# Patient Record
Sex: Male | Born: 1995
Health system: Southern US, Community
[De-identification: ages and names within clinical notes are randomized; demographics above are authoritative.]

## PROBLEM LIST (undated history)

## (undated) DIAGNOSIS — S272XXA Traumatic hemopneumothorax, initial encounter: Secondary | ICD-10-CM

## (undated) DIAGNOSIS — I82409 Acute embolism and thrombosis of unspecified deep veins of unspecified lower extremity: Secondary | ICD-10-CM

## (undated) DIAGNOSIS — Z789 Other specified health status: Secondary | ICD-10-CM

## (undated) DIAGNOSIS — J939 Pneumothorax, unspecified: Secondary | ICD-10-CM

## (undated) DIAGNOSIS — W3400XA Accidental discharge from unspecified firearms or gun, initial encounter: Secondary | ICD-10-CM

## (undated) DIAGNOSIS — S37001A Unspecified injury of right kidney, initial encounter: Secondary | ICD-10-CM

## (undated) DIAGNOSIS — N179 Acute kidney failure, unspecified: Secondary | ICD-10-CM

## (undated) DIAGNOSIS — S2241XA Multiple fractures of ribs, right side, initial encounter for closed fracture: Secondary | ICD-10-CM

## (undated) DIAGNOSIS — D62 Acute posthemorrhagic anemia: Secondary | ICD-10-CM

## (undated) DIAGNOSIS — Z9289 Personal history of other medical treatment: Secondary | ICD-10-CM

## (undated) DIAGNOSIS — T8149XA Infection following a procedure, other surgical site, initial encounter: Secondary | ICD-10-CM

## (undated) DIAGNOSIS — G40901 Epilepsy, unspecified, not intractable, with status epilepticus: Secondary | ICD-10-CM

## (undated) HISTORY — DX: Traumatic hemopneumothorax, initial encounter: S27.2XXA

## (undated) HISTORY — DX: Unspecified injury of right kidney, initial encounter: S37.001A

## (undated) HISTORY — DX: Multiple fractures of ribs, right side, initial encounter for closed fracture: S22.41XA

## (undated) HISTORY — DX: Epilepsy, unspecified, not intractable, with status epilepticus: G40.901

## (undated) HISTORY — DX: Infection following a procedure, other surgical site, initial encounter: T81.49XA

## (undated) HISTORY — DX: Acute embolism and thrombosis of unspecified deep veins of unspecified lower extremity: I82.409

## (undated) HISTORY — DX: Acute posthemorrhagic anemia: D62

## (undated) HISTORY — DX: Acute kidney failure, unspecified: N17.9

---
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
## 2014-05-23 ENCOUNTER — Encounter (HOSPITAL_COMMUNITY): Payer: Self-pay | Admitting: Adult Health

## 2014-05-23 ENCOUNTER — Emergency Department (HOSPITAL_COMMUNITY)
Admission: EM | Admit: 2014-05-23 | Discharge: 2014-05-23 | Disposition: A | Discharge status: Home / Self Care | Payer: No Typology Code available for payment source | Attending: Emergency Medicine | Admitting: Emergency Medicine

## 2014-05-23 DIAGNOSIS — L259 Unspecified contact dermatitis, unspecified cause: Secondary | ICD-10-CM | POA: Insufficient documentation

## 2014-05-23 DIAGNOSIS — Z720 Tobacco use: Secondary | ICD-10-CM | POA: Insufficient documentation

## 2014-05-23 DIAGNOSIS — M545 Low back pain, unspecified: Secondary | ICD-10-CM

## 2014-05-23 DIAGNOSIS — B86 Scabies: Secondary | ICD-10-CM

## 2014-05-23 MED ORDER — TRAMADOL HCL 50 MG PO TABS: 50.0000 mg | ORAL_TABLET | Freq: Four times a day (QID) | ORAL | Status: DC | PRN

## 2014-05-23 MED ORDER — TRAMADOL HCL 50 MG PO TABS
50.0000 mg | ORAL_TABLET | Freq: Once | ORAL | Status: AC
  Administered 2014-05-23: 50 mg via ORAL
  Filled 2014-05-23: qty 1

## 2014-05-23 MED ORDER — PERMETHRIN 5 % EX CREA: TOPICAL_CREAM | CUTANEOUS | Status: DC

## 2014-05-23 MED ORDER — DIPHENHYDRAMINE HCL 25 MG PO TABS: 25.0000 mg | ORAL_TABLET | Freq: Four times a day (QID) | ORAL | Status: DC | PRN

## 2014-05-23 NOTE — Discharge Instructions (Signed)
Scabies Scabies are small bugs (mites) that burrow under the skin and cause red bumps and severe itching. These bugs can only be seen with a microscope. Scabies are highly contagious. They can spread easily from person to person by direct contact. They are also spread through sharing clothing or linens that have the scabies mites living in them. It is not unusual for an entire family to become infected through shared towels, clothing, or bedding.  HOME CARE INSTRUCTIONS   Your caregiver may prescribe a cream or lotion to kill the mites. If cream is prescribed, massage the cream into the entire body from the neck to the bottom of both feet. Also massage the cream into the scalp and face if your child is less than 609 year old. Avoid the eyes and mouth. Do not wash your hands after application.  Leave the cream on for 8 to 12 hours. Your child should bathe or shower after the 8 to 12 hour application period. Sometimes it is helpful to apply the cream to your child right before bedtime.  One treatment is usually effective and will eliminate approximately 95% of infestations. For severe cases, your caregiver may decide to repeat the treatment in 1 week. Everyone in your household should be treated with one application of the cream.  New rashes or burrows should not appear within 24 to 48 hours after successful treatment. However, the itching and rash may last for 2 to 4 weeks after successful treatment. Your caregiver may prescribe a medicine to help with the itching or to help the rash go away more quickly.  Scabies can live on clothing or linens for up to 3 days. All of your child's recently used clothing, towels, stuffed toys, and bed linens should be washed in hot water and then dried in a dryer for at least 20 minutes on high heat. Items that cannot be washed should be enclosed in a plastic bag for at least 3 days.  To help relieve itching, bathe your child in a cool bath or apply cool washcloths to the  affected areas.  Your child may return to school after treatment with the prescribed cream. SEEK MEDICAL CARE IF:   The itching persists longer than 4 weeks after treatment.  The rash spreads or becomes infected. Signs of infection include red blisters or yellow-tan crust. Document Released: 07/07/2005 Document Revised: 09/29/2011 Document Reviewed: 11/15/2008 [facility] CenterExitCare Patient Information 2015 [place]ExitCare, [place]. This information is not intended to replace advice given to you by your health care provider. Make sure you discuss any questions you have with your health care provider. Bedbugs Bedbugs are tiny bugs that live in and around beds. During the day, they hide in mattresses and other places near beds. They come out at night and bite people lying in bed. They need blood to live and grow. Bedbugs can be found in beds anywhere. Usually, they are found in places where many people come and go (hotels, shelters, hospitals). It does not matter whether the place is dirty or clean. Getting bitten by bedbugs rarely causes a medical problem. The biggest problem can be getting rid of them. This often takes the work of a Oncologistpest control expert. CAUSES  Less use of pesticides. Bedbugs were common before the 1950s. Then, strong pesticides such as DDT nearly wiped them out. Today, these pesticides are not used because they harm the environment and can cause health problems.  More travel. Besides mattresses, bedbugs can also live in clothing and luggage. They can  come along as people travel from place to place. Bedbugs are more common in certain parts of the world. When people travel to those areas, the bugs can come home with them.  Presence of birds and bats. Bedbugs often infest birds and bats. If you have these animals in or near your home, bedbugs may infest your house, too. SYMPTOMS It does not hurt to be bitten by a bedbug. You will probably not wake up when you are bitten. Bedbugs usually bite areas of  the skin that are not covered. Symptoms may show when you wake up, or they may take a day or more to show up. Symptoms may include:  Small red bumps on the skin. These might be lined up in a row or clustered in a group.  A darker red dot in the middle of red bumps.  Blisters on the skin. There may be swelling and very bad itching. These may be signs of an allergic reaction. This does not happen often. DIAGNOSIS Bedbug bites might look and feel like other types of insect bites. The bugs do not stay on the body like ticks or lice. They bite, drop off, and crawl away to hide. Your caregiver will probably:  Ask about your symptoms.  Ask about your recent activities and travel.  Check your skin for bedbug bites.  Ask you to check at home for signs of bedbugs. You should look for:  Spots or stains on the bed or nearby. This could be from bedbugs that were crushed or from their eggs or waste.  Bedbugs themselves. They are reddish-brown, oval, and flat. They do not fly. They are about the size of an apple seed.  Places to look for bedbugs include:  Beds. Check mattresses, headboards, box springs, and bed frames.  On drapes and curtains near the bed.  Under carpeting in the bedroom.  Behind electrical outlets.  Behind any wallpaper that is peeling.  Inside luggage. TREATMENT Most bedbug bites do not need treatment. They usually go away on their own in a few days. The bites are not dangerous. However, treatment may be needed if you have scratched so much that your skin has become infected. You may also need treatment if you are allergic to bedbug bites. Treatment options include:  A drug that stops swelling and itching (corticosteroid). Usually, a cream is rubbed on the skin. If you have a bad rash, you may be given a corticosteroid pill.  Oral antihistamines. These are pills to help control itching.  Antibiotic medicines. An antibiotic may be prescribed for infected skin. HOME CARE  INSTRUCTIONS   Take any medicine prescribed by your caregiver for your bites. Follow the directions carefully.  Consider wearing pajamas with long sleeves and pant legs.  Your bedroom may need to be treated. A pest control expert should make sure the bedbugs are gone. You may need to throw away mattresses or luggage. Ask the pest control expert what you can do to keep the bedbugs from coming back. Common suggestions include:  Putting a plastic cover over your mattress.  Washing and drying your clothes and bedding in hot water and a hot dryer. The temperature should be hotter than 120 F (48.9 C). Bedbugs are killed by high temperatures.  Vacuuming carefully all around your bed. Vacuum in all cracks and crevices where the bugs might hide. Do this often.  Carefully checking all used furniture, bedding, or clothes that you bring into your house.  Eliminating bird nests and bat roosts.  If you get bedbug bites when traveling, check all your possessions carefully before bringing them into your house. If you find any bugs on clothes or in your luggage, consider throwing those items away. SEEK MEDICAL CARE IF:  You have red bug bites that keep coming back.  You have red bug bites that itch badly.  You have bug bites that cause a skin rash.  You have scratch marks that are red and sore. SEEK IMMEDIATE MEDICAL CARE IF: You have a fever. Document Released: 08/09/2010 Document Revised: 09/29/2011 Document Reviewed: 08/09/2010 [facility]Exit[facility] Patient Information 2015 [place]ExitCare, [place]LLC. This information is not intended to replace advice given to you by your health care provider. Make sure you discuss any questions you have with your health care provider. Muscle Strain A muscle strain is an injury that occurs when a muscle is stretched beyond its normal length. Usually a small number of muscle fibers are torn when this happens. Muscle strain is rated in degrees. First-degree strains have the least amount  of muscle fiber tearing and pain. Second-degree and third-degree strains have increasingly more tearing and pain.  Usually, recovery from muscle strain takes 1-2 weeks. Complete healing takes 5-6 weeks.  CAUSES  Muscle strain happens when a sudden, violent force placed on a muscle stretches it too far. This may occur with lifting, sports, or a fall.  RISK FACTORS Muscle strain is especially common in athletes.  SIGNS AND SYMPTOMS At the site of the muscle strain, there may be:  Pain.  Bruising.  Swelling.  Difficulty using the muscle due to pain or lack of normal function. DIAGNOSIS  Your health care provider will perform a physical exam and ask about your medical history. TREATMENT  Often, the best treatment for a muscle strain is resting, icing, and applying cold compresses to the injured area.  HOME CARE INSTRUCTIONS   Use the PRICE method of treatment to promote muscle healing during the first 2-3 days after your injury. The PRICE method involves:  Protecting the muscle from being injured again.  Restricting your activity and resting the injured body part.  Icing your injury. To do this, put ice in a plastic bag. Place a towel between your skin and the bag. Then, apply the ice and leave it on from 15-20 minutes each hour. After the third day, switch to moist heat packs.  Apply compression to the injured area with a splint or elastic bandage. Be careful not to wrap it too tightly. This may interfere with blood circulation or increase swelling.  Elevate the injured body part above the level of your heart as often as you can.  Only take over-the-counter or prescription medicines for pain, discomfort, or fever as directed by your health care provider.  Warming up prior to exercise helps to prevent future muscle strains. SEEK MEDICAL CARE IF:   You have increasing pain or swelling in the injured area.  You have numbness, tingling, or a significant loss of strength in the  injured area. MAKE SURE YOU:   Understand these instructions.  Will watch your condition.  Will get help right away if you are not doing well or get worse. Document Released: 07/07/2005 Document Revised: 04/27/2013 Document Reviewed: 02/03/2013 [facility] OhioExitCare Patient Information 2015 [place]ExitCare, [place]LLC. This information is not intended to replace advice given to you by your health care provider. Make sure you discuss any questions you have with your health care provider.
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
## 2014-05-23 NOTE — ED Notes (Signed)
Presents with one-two weeks of itchy rash to extremities and trunk. C/o itching, GF has same rash.

## 2015-06-28 ENCOUNTER — Emergency Department (HOSPITAL_COMMUNITY)
Admission: EM | Admit: 2015-06-28 | Discharge: 2015-06-28 | Disposition: A | Discharge status: Home / Self Care | Payer: Medicaid Other | Attending: Emergency Medicine | Admitting: Emergency Medicine

## 2015-06-28 ENCOUNTER — Encounter (HOSPITAL_COMMUNITY): Payer: Self-pay | Admitting: Emergency Medicine

## 2015-06-28 DIAGNOSIS — L0291 Cutaneous abscess, unspecified: Secondary | ICD-10-CM

## 2015-06-28 DIAGNOSIS — M272 Inflammatory conditions of jaws: Secondary | ICD-10-CM | POA: Insufficient documentation

## 2015-06-28 DIAGNOSIS — F17210 Nicotine dependence, cigarettes, uncomplicated: Secondary | ICD-10-CM | POA: Insufficient documentation

## 2015-06-28 MED ORDER — LIDOCAINE HCL (PF) 1 % IJ SOLN
5.0000 mL | Freq: Once | INTRAMUSCULAR | Status: DC
  Filled 2015-06-28: qty 5

## 2015-06-28 NOTE — ED Provider Notes (Signed)
CSN: [id number]     Arrival date & time 06/28/15  1129 History  By signing my name below, I, Samuel Owens, attest that this documentation has been prepared under the direction and in the presence of  Samuel PeekBenjamin Kaelani Kendrick, PA-C. Electronically Signed: Doreatha MartinEva Owens, ED Scribe. 06/28/2015. 2:05 PM.    Chief Complaint  Patient presents with  . Abscess   The history is provided by the patient. No language interpreter was used.    HPI Comments: [name] is a 19 y.o. male who presents to the Emergency Department complaining of a moderate, gradually worsening area of pain and swelling to the right jaw onset 2 days ago. Pt states pain is worsened with palpation. Pt states h/o similar abscess on the left ear, which was lanced. Pt denies taking OTC medications at home to improve symptoms. He states he has tried a warm compress with no relief. No h/o DM, HIV. He denies ear pain, decreased hearing, HA, fever, neck pain.    History reviewed. No pertinent past medical history. History reviewed. No pertinent past surgical history. History reviewed. No pertinent family history. Social History  Substance Use Topics  . Smoking status: Current Every Day Smoker -- 0.50 packs/day    Types: Cigarettes  . Smokeless tobacco: Never Used  . Alcohol Use: Yes     Comment: "sometimes"    Review of Systems A 10 point review of systems was completed and was negative except for pertinent positives and negatives as mentioned in the history of present illness.   Allergies  Review of patient's allergies indicates no known allergies.  Home Medications   Prior to Admission medications   Medication Sig Start Date End Date Taking? Authorizing Provider  acetaminophen (TYLENOL) 325 MG tablet Take 650 mg by mouth every 6 (six) hours as needed for pain.    Historical Provider, MD  diphenhydrAMINE (BENADRYL) 25 MG tablet Take 1 tablet (25 mg total) by mouth every 6 (six) hours as needed for itching (Rash). 05/23/14   Antony MaduraKelly  Humes, PA-C  ibuprofen (ADVIL,MOTRIN) 200 MG tablet Take 400 mg by mouth every 6 (six) hours as needed for pain.    Historical Provider, MD  permethrin (ELIMITE) 5 % cream Apply to entire body other than face - let sit for 14 hours then wash off, may repeat in 1 week if still having symptoms 05/23/14   Antony MaduraKelly Humes, PA-C  traMADol (ULTRAM) 50 MG tablet Take 1 tablet (50 mg total) by mouth every 6 (six) hours as needed for moderate pain or severe pain. 05/23/14   Antony MaduraKelly Humes, PA-C   BP 137/90 mmHg  Pulse 56  Temp(Src) 97.8 F (36.6 C) (Oral)  Resp 15  SpO2 100% Physical Exam  Constitutional: He is oriented to person, place, and time. He appears well-developed and well-nourished.  Awake, alert, nontoxic appearance.    HENT:  Head: Normocephalic and atraumatic.  Mouth/Throat: Oropharynx is clear and moist. No oropharyngeal exudate.  Fluctuant erythematous area noted to the right mandibular jaw line just anterior to the pinna. Area is approximately 2.5cm in diameter.   Eyes: Conjunctivae and EOM are normal. Pupils are equal, round, and reactive to light.  Neck: Normal range of motion. Neck supple.  No cervical lymphadenopathy.   Cardiovascular: Normal rate, regular rhythm and normal heart sounds.  Exam reveals no gallop and no friction rub.   No murmur heard. Heart sounds normal. RRR.   Pulmonary/Chest: Effort normal and breath sounds normal. No respiratory distress. He has no wheezes. He  has no rales.  Lungs CTA bilaterally.   Abdominal: Soft. Bowel sounds are normal. He exhibits no distension. There is no tenderness.  Musculoskeletal: Normal range of motion.  Lymphadenopathy:    He has no cervical adenopathy.  Neurological: He is alert and oriented to person, place, and time.  Skin: Skin is warm and dry.  Psychiatric: He has a normal mood and affect. His behavior is normal.  Nursing note and vitals reviewed.  ED Course  Procedures (including critical care time) DIAGNOSTIC  STUDIES: Oxygen Saturation is 97% on RA, normal by my interpretation.    COORDINATION OF CARE: 1:59 PM Discussed treatment plan with pt at bedside and pt agreed to plan. Plan to [place] abscess and incision/drainage.    EMERGENCY DEPARTMENT US SOFT TISSUE INTERPRETATION "Study: Limited Soft Tissue Ultrasound"  INDICATIONS: Pain Multiple views of the body part were obtained in real-time with a multi-frequency linear probe PERFORMED BY:  Myself IMAGES ARCHIVED?: No SIDE:Right  BODY PART:Other soft tisse (comment in note) Right mandibular jaw line just anterior to the pinna.  FINDINGS: Abcess present and Cellulitis absent INTERPRETATION:  Abcess present and No cellulitis noted  INCISION AND DRAINAGE PROCEDURE NOTE: Patient identification was confirmed and verbal consent was obtained. This procedure was performed by Samuel Peek, PA-C at 2:37 PM. Site: Right mandibular jaw line just anterior to the pinna.  Sterile procedures observed Needle size: 25 Anesthetic used (type and amt): 3 mL 1% lidocaine without epi Blade size: 11 Drainage: Moderate amount of purulent drainage Complexity: Complex Site anesthetized, incision made over site, wound drained and explored loculations, rinsed with copious amounts of normal saline, topical bacitracin applied, covered with dry, sterile dressing.  Pt tolerated procedure well without complications.  Instructions for care discussed verbally and pt provided with additional written instructions for homecare and f/u.   Meds given in ED:  Medications  lidocaine (PF) (XYLOCAINE) 1 % injection 5 mL (not administered)    Discharge Medication List as of 06/28/2015  2:53 PM       Filed Vitals:   06/28/15 1210 06/28/15 1456  BP: 122/76 137/90  Pulse: 66 56  Temp: 97.8 F (36.6 C) 97.8 F (36.6 C)  Resp: 16 15    MDM   Final diagnoses:  Abscess    Patient with skin abscess. Bedside US showed abscess present with no cellulitis. Incision and  drainage performed in the ED today. Abscess was not large enough to warrant packing or drain placement. Topical bacitracin with dressing applied. Wound recheck in 2 days. Supportive care and return precautions discussed. The patient appears reasonably screened and/or stabilized for discharge and I doubt any other emergent medical condition requiring further screening, evaluation, or treatment in the ED prior to discharge.   I personally performed the services described in this documentation, which was scribed in my presence. The recorded information has been reviewed and is accurate.     Samuel Peek, PA-C 06/28/15 1544  Alvira Monday, MD 06/29/15 [phone]

## 2015-06-28 NOTE — ED Notes (Signed)
Pt from home with c/o abscess on right jaw line starting x 2 days.  Pt ambulatory NAD A&O

## 2015-06-28 NOTE — Discharge Instructions (Signed)
Your wound will drain over the next few days. Try to keep it clean and dry. Your wound should heal on its own. He may use a topical antibiotic. Follow-up with your doctor as needed. Return to ED for any new or worsening symptoms.  Incision and Drainage Incision and drainage is a procedure in which a sac-like structure (cystic structure) is opened and drained. The area to be drained usually contains material such as pus, fluid, or blood.  LET YOUR CAREGIVER KNOW ABOUT:   Allergies to medicine.  Medicines taken, including vitamins, herbs, eyedrops, over-the-counter medicines, and creams.  Use of steroids (by mouth or creams).  Previous problems with anesthetics or numbing medicines.  History of bleeding problems or blood clots.  Previous surgery.  Other health problems, including diabetes and kidney problems.  Possibility of pregnancy, if this applies. RISKS AND COMPLICATIONS  Pain.  Bleeding.  Scarring.  Infection. BEFORE THE PROCEDURE  You may need to have an ultrasound or other imaging tests to see how large or deep your cystic structure is. Blood tests may also be used to determine if you have an infection or how severe the infection is. You may need to have a tetanus shot. PROCEDURE  The affected area is cleaned with a cleaning fluid. The cyst area will then be numbed with a medicine (local anesthetic). A small incision will be made in the cystic structure. A syringe or catheter may be used to drain the contents of the cystic structure, or the contents may be squeezed out. The area will then be flushed with a cleansing solution. After cleansing the area, it is often gently packed with a gauze or another wound dressing. Once it is packed, it will be covered with gauze and tape or some other type of wound dressing. AFTER THE PROCEDURE   Often, you will be allowed to go home right after the procedure.  You may be given antibiotic medicine to prevent or heal an infection.  If  the area was packed with gauze or some other wound dressing, you will likely need to come back in 1 to 2 days to get it removed.  The area should heal in about 14 days.   This information is not intended to replace advice given to you by your health care provider. Make sure you discuss any questions you have with your health care provider.   Document Released: 12/31/2000 Document Revised: 01/06/2012 Document Reviewed: 09/01/2011 Elsevier Interactive Patient Education [employer]

## 2015-07-22 DIAGNOSIS — W3400XA Accidental discharge from unspecified firearms or gun, initial encounter: Secondary | ICD-10-CM

## 2015-07-22 HISTORY — DX: Accidental discharge from unspecified firearms or gun, initial encounter: W34.00XA

## 2016-01-03 ENCOUNTER — Inpatient Hospital Stay (HOSPITAL_COMMUNITY)
Admission: EM | Admit: 2016-01-03 | Discharge: 2016-02-12 | DRG: 957 | Disposition: A | Discharge status: Home Health | Payer: Medicaid Other | Attending: General Surgery | Admitting: General Surgery

## 2016-01-03 ENCOUNTER — Emergency Department (HOSPITAL_COMMUNITY): Payer: Medicaid Other

## 2016-01-03 ENCOUNTER — Encounter (HOSPITAL_COMMUNITY): Payer: Self-pay | Admitting: *Deleted

## 2016-01-03 ENCOUNTER — Encounter (HOSPITAL_COMMUNITY): Admission: EM | Disposition: A | Discharge status: Home Health | Payer: Self-pay | Source: Home / Self Care

## 2016-01-03 ENCOUNTER — Ambulatory Visit (HOSPITAL_COMMUNITY)
Admission: RE | Admit: 2016-01-03 | Discharge: 2016-01-03 | Disposition: A | Discharge status: Other Acute Inpatient Hospital | Payer: Medicaid Other | Source: Ambulatory Visit | Attending: Urology | Admitting: Urology

## 2016-01-03 DIAGNOSIS — Y249XXA Unspecified firearm discharge, undetermined intent, initial encounter: Secondary | ICD-10-CM

## 2016-01-03 DIAGNOSIS — N151 Renal and perinephric abscess: Secondary | ICD-10-CM | POA: Diagnosis not present

## 2016-01-03 DIAGNOSIS — Z978 Presence of other specified devices: Secondary | ICD-10-CM | POA: Diagnosis present

## 2016-01-03 DIAGNOSIS — R739 Hyperglycemia, unspecified: Secondary | ICD-10-CM | POA: Diagnosis not present

## 2016-01-03 DIAGNOSIS — S37001A Unspecified injury of right kidney, initial encounter: Secondary | ICD-10-CM | POA: Diagnosis present

## 2016-01-03 DIAGNOSIS — J80 Acute respiratory distress syndrome: Secondary | ICD-10-CM | POA: Diagnosis not present

## 2016-01-03 DIAGNOSIS — J9601 Acute respiratory failure with hypoxia: Secondary | ICD-10-CM

## 2016-01-03 DIAGNOSIS — I998 Other disorder of circulatory system: Secondary | ICD-10-CM | POA: Diagnosis not present

## 2016-01-03 DIAGNOSIS — J9690 Respiratory failure, unspecified, unspecified whether with hypoxia or hypercapnia: Secondary | ICD-10-CM

## 2016-01-03 DIAGNOSIS — W3400XA Accidental discharge from unspecified firearms or gun, initial encounter: Secondary | ICD-10-CM

## 2016-01-03 DIAGNOSIS — N390 Urinary tract infection, site not specified: Secondary | ICD-10-CM | POA: Diagnosis not present

## 2016-01-03 DIAGNOSIS — N179 Acute kidney failure, unspecified: Secondary | ICD-10-CM | POA: Diagnosis not present

## 2016-01-03 DIAGNOSIS — IMO0001 Reserved for inherently not codable concepts without codable children: Secondary | ICD-10-CM

## 2016-01-03 DIAGNOSIS — R509 Fever, unspecified: Secondary | ICD-10-CM | POA: Diagnosis not present

## 2016-01-03 DIAGNOSIS — J9600 Acute respiratory failure, unspecified whether with hypoxia or hypercapnia: Secondary | ICD-10-CM | POA: Diagnosis present

## 2016-01-03 DIAGNOSIS — Z09 Encounter for follow-up examination after completed treatment for conditions other than malignant neoplasm: Secondary | ICD-10-CM

## 2016-01-03 DIAGNOSIS — S21139A Puncture wound without foreign body of unspecified front wall of thorax without penetration into thoracic cavity, initial encounter: Secondary | ICD-10-CM | POA: Diagnosis present

## 2016-01-03 DIAGNOSIS — S36113A Laceration of liver, unspecified degree, initial encounter: Secondary | ICD-10-CM | POA: Diagnosis present

## 2016-01-03 DIAGNOSIS — T8149XA Infection following a procedure, other surgical site, initial encounter: Secondary | ICD-10-CM

## 2016-01-03 DIAGNOSIS — G40901 Epilepsy, unspecified, not intractable, with status epilepticus: Secondary | ICD-10-CM | POA: Insufficient documentation

## 2016-01-03 DIAGNOSIS — A419 Sepsis, unspecified organism: Secondary | ICD-10-CM

## 2016-01-03 DIAGNOSIS — R1110 Vomiting, unspecified: Secondary | ICD-10-CM

## 2016-01-03 DIAGNOSIS — Z4659 Encounter for fitting and adjustment of other gastrointestinal appliance and device: Secondary | ICD-10-CM

## 2016-01-03 DIAGNOSIS — K661 Hemoperitoneum: Secondary | ICD-10-CM | POA: Diagnosis present

## 2016-01-03 DIAGNOSIS — D696 Thrombocytopenia, unspecified: Secondary | ICD-10-CM | POA: Diagnosis present

## 2016-01-03 DIAGNOSIS — N170 Acute kidney failure with tubular necrosis: Secondary | ICD-10-CM | POA: Diagnosis not present

## 2016-01-03 DIAGNOSIS — I82C19 Acute embolism and thrombosis of unspecified internal jugular vein: Secondary | ICD-10-CM | POA: Diagnosis not present

## 2016-01-03 DIAGNOSIS — R0902 Hypoxemia: Secondary | ICD-10-CM

## 2016-01-03 DIAGNOSIS — D72829 Elevated white blood cell count, unspecified: Secondary | ICD-10-CM

## 2016-01-03 DIAGNOSIS — S299XXA Unspecified injury of thorax, initial encounter: Secondary | ICD-10-CM

## 2016-01-03 DIAGNOSIS — Z9689 Presence of other specified functional implants: Secondary | ICD-10-CM

## 2016-01-03 DIAGNOSIS — T8143XA Infection following a procedure, organ and space surgical site, initial encounter: Secondary | ICD-10-CM | POA: Diagnosis not present

## 2016-01-03 DIAGNOSIS — I158 Other secondary hypertension: Secondary | ICD-10-CM

## 2016-01-03 DIAGNOSIS — IMO0002 Reserved for concepts with insufficient information to code with codable children: Secondary | ICD-10-CM | POA: Insufficient documentation

## 2016-01-03 DIAGNOSIS — R5383 Other fatigue: Secondary | ICD-10-CM | POA: Diagnosis present

## 2016-01-03 DIAGNOSIS — J942 Hemothorax: Secondary | ICD-10-CM

## 2016-01-03 DIAGNOSIS — D62 Acute posthemorrhagic anemia: Secondary | ICD-10-CM | POA: Diagnosis not present

## 2016-01-03 DIAGNOSIS — S2241XA Multiple fractures of ribs, right side, initial encounter for closed fracture: Secondary | ICD-10-CM | POA: Diagnosis present

## 2016-01-03 DIAGNOSIS — T17908A Unspecified foreign body in respiratory tract, part unspecified causing other injury, initial encounter: Secondary | ICD-10-CM

## 2016-01-03 DIAGNOSIS — R6521 Severe sepsis with septic shock: Secondary | ICD-10-CM | POA: Diagnosis not present

## 2016-01-03 DIAGNOSIS — R188 Other ascites: Secondary | ICD-10-CM

## 2016-01-03 DIAGNOSIS — R000 Tachycardia, unspecified: Secondary | ICD-10-CM

## 2016-01-03 DIAGNOSIS — E861 Hypovolemia: Secondary | ICD-10-CM | POA: Diagnosis not present

## 2016-01-03 DIAGNOSIS — E876 Hypokalemia: Secondary | ICD-10-CM

## 2016-01-03 DIAGNOSIS — R569 Unspecified convulsions: Secondary | ICD-10-CM

## 2016-01-03 DIAGNOSIS — D6489 Other specified anemias: Secondary | ICD-10-CM | POA: Diagnosis not present

## 2016-01-03 DIAGNOSIS — I82629 Acute embolism and thrombosis of deep veins of unspecified upper extremity: Secondary | ICD-10-CM | POA: Diagnosis not present

## 2016-01-03 DIAGNOSIS — R740 Nonspecific elevation of levels of transaminase and lactic acid dehydrogenase [LDH]: Secondary | ICD-10-CM | POA: Diagnosis present

## 2016-01-03 DIAGNOSIS — K651 Peritoneal abscess: Secondary | ICD-10-CM | POA: Diagnosis not present

## 2016-01-03 DIAGNOSIS — S272XXA Traumatic hemopneumothorax, initial encounter: Secondary | ICD-10-CM | POA: Diagnosis present

## 2016-01-03 DIAGNOSIS — R0989 Other specified symptoms and signs involving the circulatory and respiratory systems: Secondary | ICD-10-CM

## 2016-01-03 DIAGNOSIS — R34 Anuria and oliguria: Secondary | ICD-10-CM | POA: Diagnosis not present

## 2016-01-03 DIAGNOSIS — J690 Pneumonitis due to inhalation of food and vomit: Secondary | ICD-10-CM | POA: Diagnosis not present

## 2016-01-03 DIAGNOSIS — E872 Acidosis: Secondary | ICD-10-CM | POA: Diagnosis not present

## 2016-01-03 DIAGNOSIS — E871 Hypo-osmolality and hyponatremia: Secondary | ICD-10-CM

## 2016-01-03 DIAGNOSIS — B9620 Unspecified Escherichia coli [E. coli] as the cause of diseases classified elsewhere: Secondary | ICD-10-CM | POA: Diagnosis not present

## 2016-01-03 DIAGNOSIS — S37031A Laceration of right kidney, unspecified degree, initial encounter: Secondary | ICD-10-CM | POA: Diagnosis present

## 2016-01-03 DIAGNOSIS — G9340 Encephalopathy, unspecified: Secondary | ICD-10-CM | POA: Diagnosis not present

## 2016-01-03 DIAGNOSIS — I82409 Acute embolism and thrombosis of unspecified deep veins of unspecified lower extremity: Secondary | ICD-10-CM | POA: Diagnosis not present

## 2016-01-03 DIAGNOSIS — K750 Abscess of liver: Secondary | ICD-10-CM | POA: Diagnosis not present

## 2016-01-03 DIAGNOSIS — Y95 Nosocomial condition: Secondary | ICD-10-CM | POA: Diagnosis not present

## 2016-01-03 DIAGNOSIS — I10 Essential (primary) hypertension: Secondary | ICD-10-CM | POA: Diagnosis not present

## 2016-01-03 DIAGNOSIS — E8770 Fluid overload, unspecified: Secondary | ICD-10-CM | POA: Diagnosis not present

## 2016-01-03 DIAGNOSIS — T814XXD Infection following a procedure, subsequent encounter: Secondary | ICD-10-CM

## 2016-01-03 DIAGNOSIS — G8918 Other acute postprocedural pain: Secondary | ICD-10-CM

## 2016-01-03 DIAGNOSIS — A047 Enterocolitis due to Clostridium difficile: Secondary | ICD-10-CM | POA: Diagnosis not present

## 2016-01-03 HISTORY — PX: CHOLECYSTECTOMY: SHX55

## 2016-01-03 HISTORY — DX: Other specified health status: Z78.9

## 2016-01-03 HISTORY — PX: HEPATORRHAPHY: SHX6320

## 2016-01-03 HISTORY — DX: Accidental discharge from unspecified firearms or gun, initial encounter: W34.00XA

## 2016-01-03 HISTORY — PX: LAPAROTOMY: SHX154

## 2016-01-03 HISTORY — PX: APPLICATION OF WOUND VAC: SHX5189

## 2016-01-03 LAB — CBC
HCT: 36.5 % — ABNORMAL LOW (ref 39.0–52.0)
Hemoglobin: 11.5 g/dL — ABNORMAL LOW (ref 13.0–17.0)
MCH: 28.9 pg (ref 26.0–34.0)
MCHC: 31.5 g/dL (ref 30.0–36.0)
MCV: 91.7 fL (ref 78.0–100.0)
Platelets: 240 10*3/uL (ref 150–400)
RBC: 3.98 MIL/uL — ABNORMAL LOW (ref 4.22–5.81)
RDW: 13.6 % (ref 11.5–15.5)
WBC: 8.9 10*3/uL (ref 4.0–10.5)

## 2016-01-03 LAB — URINE MICROSCOPIC-ADD ON

## 2016-01-03 LAB — I-STAT CG4 LACTIC ACID, ED: Lactic Acid, Venous: 6.35 mmol/L (ref 0.5–2.0)

## 2016-01-03 LAB — URINALYSIS, ROUTINE W REFLEX MICROSCOPIC
Glucose, UA: 250 mg/dL — AB
Ketones, ur: 40 mg/dL — AB
Nitrite: POSITIVE — AB
Protein, ur: >300 mg/dL — AB
Specific Gravity, Urine: 1.032 — ABNORMAL HIGH (ref 1.005–1.030)
pH: 5.5 (ref 5.0–8.0)

## 2016-01-03 LAB — I-STAT CHEM 8, ED
BUN: 9 mg/dL (ref 6–20)
Calcium, Ion: 1.1 mmol/L — ABNORMAL LOW (ref 1.12–1.23)
Chloride: 106 mmol/L (ref 101–111)
Creatinine, Ser: 1.4 mg/dL — ABNORMAL HIGH (ref 0.61–1.24)
Glucose, Bld: 229 mg/dL — ABNORMAL HIGH (ref 65–99)
HCT: 37 % — ABNORMAL LOW (ref 39.0–52.0)
Hemoglobin: 12.6 g/dL — ABNORMAL LOW (ref 13.0–17.0)
Potassium: 3.1 mmol/L — ABNORMAL LOW (ref 3.5–5.1)
Sodium: 143 mmol/L (ref 135–145)
TCO2: 22 mmol/L (ref 0–100)

## 2016-01-03 LAB — COMPREHENSIVE METABOLIC PANEL
ALT: 134 U/L — ABNORMAL HIGH (ref 17–63)
AST: 175 U/L — ABNORMAL HIGH (ref 15–41)
Albumin: 3 g/dL — ABNORMAL LOW (ref 3.5–5.0)
Alkaline Phosphatase: 50 U/L (ref 38–126)
Anion gap: 12 (ref 5–15)
BUN: 8 mg/dL (ref 6–20)
CO2: 17 mmol/L — ABNORMAL LOW (ref 22–32)
Calcium: 8.2 mg/dL — ABNORMAL LOW (ref 8.9–10.3)
Chloride: 110 mmol/L (ref 101–111)
Creatinine, Ser: 1.53 mg/dL — ABNORMAL HIGH (ref 0.61–1.24)
GFR calc Af Amer: >60 mL/min (ref >60)
GFR calc non Af Amer: >60 mL/min (ref >60)
Glucose, Bld: 236 mg/dL — ABNORMAL HIGH (ref 65–99)
Potassium: 3.1 mmol/L — ABNORMAL LOW (ref 3.5–5.1)
Sodium: 139 mmol/L (ref 135–145)
Total Bilirubin: 0.4 mg/dL (ref 0.3–1.2)
Total Protein: 5.3 g/dL — ABNORMAL LOW (ref 6.5–8.1)

## 2016-01-03 LAB — ABO/RH: ABO/RH(D): AB POS

## 2016-01-03 LAB — ETHANOL: Alcohol, Ethyl (B): <5 mg/dL (ref <5)

## 2016-01-03 LAB — PROTIME-INR
INR: 1.25 (ref 0.00–1.49)
Prothrombin Time: 15.8 seconds — ABNORMAL HIGH (ref 11.6–15.2)

## 2016-01-03 SURGERY — RADIOLOGY WITH ANESTHESIA
Anesthesia: General

## 2016-01-03 SURGERY — LAPAROTOMY, EXPLORATORY
Anesthesia: General | Site: Abdomen

## 2016-01-03 MED ORDER — IOPAMIDOL (ISOVUE-300) INJECTION 61%
INTRAVENOUS | Status: AC
  Filled 2016-01-03: qty 100

## 2016-01-03 MED ORDER — MORPHINE SULFATE 2 MG/ML IJ SOLN
INTRAMUSCULAR | Status: AC | PRN
  Administered 2016-01-03: 4 mg via INTRAVENOUS

## 2016-01-03 MED ORDER — PROPOFOL 1000 MG/100ML IV EMUL
INTRAVENOUS | Status: AC | PRN
  Administered 2016-01-03: 10 ug/kg/min via INTRAVENOUS

## 2016-01-03 MED ORDER — CEFAZOLIN SODIUM-DEXTROSE 2-4 GM/100ML-% IV SOLN
2.0000 g | Freq: Once | INTRAVENOUS | Status: AC
  Administered 2016-01-03: 2 g via INTRAVENOUS

## 2016-01-03 MED ORDER — SODIUM CHLORIDE 0.9 % IV SOLN
INTRAVENOUS | Status: AC | PRN
  Administered 2016-01-03: 1000 mL via INTRAVENOUS

## 2016-01-03 MED ORDER — MIDAZOLAM HCL 2 MG/2ML IJ SOLN
INTRAMUSCULAR | Status: AC
  Filled 2016-01-03: qty 2

## 2016-01-03 MED ORDER — CEFAZOLIN SODIUM 1-5 GM-% IV SOLN: 1.0000 g | Freq: Once | INTRAVENOUS | Status: DC

## 2016-01-03 MED ORDER — FENTANYL CITRATE (PF) 100 MCG/2ML IJ SOLN
INTRAMUSCULAR | Status: AC
  Filled 2016-01-03: qty 2

## 2016-01-03 MED ORDER — MIDAZOLAM HCL 2 MG/2ML IJ SOLN
INTRAMUSCULAR | Status: AC
  Administered 2016-01-04: 2 mg via INTRAVENOUS
  Filled 2016-01-03: qty 2

## 2016-01-03 MED ORDER — PROPOFOL 10 MG/ML IV BOLUS
INTRAVENOUS | Status: AC | PRN
  Administered 2016-01-03: 10 mg via INTRAVENOUS
  Administered 2016-01-03: 20 mg via INTRAVENOUS

## 2016-01-03 MED ORDER — MIDAZOLAM HCL 2 MG/2ML IJ SOLN
INTRAMUSCULAR | Status: AC
  Filled 2016-01-03: qty 4

## 2016-01-03 MED ORDER — PROPOFOL 1000 MG/100ML IV EMUL
INTRAVENOUS | Status: AC
  Administered 2016-01-04: 50 ug/kg/min via INTRAVENOUS
  Filled 2016-01-03: qty 100

## 2016-01-03 MED ORDER — FENTANYL CITRATE (PF) 250 MCG/5ML IJ SOLN
INTRAMUSCULAR | Status: AC
  Filled 2016-01-03: qty 5

## 2016-01-03 MED ORDER — MIDAZOLAM HCL 5 MG/5ML IJ SOLN
INTRAMUSCULAR | Status: AC | PRN
  Administered 2016-01-03: 4 mg via INTRAVENOUS
  Administered 2016-01-03: 2 mg via INTRAVENOUS

## 2016-01-03 MED ORDER — ETOMIDATE 2 MG/ML IV SOLN
INTRAVENOUS | Status: AC | PRN
  Administered 2016-01-03: 20 mg via INTRAVENOUS

## 2016-01-03 MED ORDER — FENTANYL CITRATE (PF) 100 MCG/2ML IJ SOLN
INTRAMUSCULAR | Status: AC | PRN
  Administered 2016-01-03: 50 ug via INTRAVENOUS

## 2016-01-03 MED ORDER — IOPAMIDOL (ISOVUE-300) INJECTION 61%
INTRAVENOUS | Status: AC
  Administered 2016-01-03: 100 mL
  Filled 2016-01-03: qty 100

## 2016-01-03 MED ORDER — LIDOCAINE HCL 1 % IJ SOLN
INTRAMUSCULAR | Status: AC
  Administered 2016-01-04: 10 mL
  Filled 2016-01-03: qty 20

## 2016-01-03 MED ORDER — SUCCINYLCHOLINE CHLORIDE 20 MG/ML IJ SOLN
INTRAMUSCULAR | Status: AC | PRN
Start: 2016-01-03 — End: 2016-01-03
  Administered 2016-01-03: 100 mg via INTRAVENOUS

## 2016-01-03 SURGICAL SUPPLY — 52 items
APPLIER CLIP ROT 10 11.4 M/L (STAPLE) ×3
APR CLP MED LRG 11.4X10 (STAPLE) ×1
BLADE SURG ROTATE 9660 (MISCELLANEOUS) ×2 IMPLANT
CANISTER SUCTION 2500CC (MISCELLANEOUS) ×3 IMPLANT
CANISTER WOUND CARE 500ML ATS (WOUND CARE) ×2 IMPLANT
CHLORAPREP W/TINT 26ML (MISCELLANEOUS) ×3 IMPLANT
CLIP APPLIE ROT 10 11.4 M/L (STAPLE) IMPLANT
COVER SURGICAL LIGHT HANDLE (MISCELLANEOUS) ×3 IMPLANT
DRAPE LAPAROSCOPIC ABDOMINAL (DRAPES) ×3 IMPLANT
DRAPE WARM FLUID 44X44 (DRAPE) ×3 IMPLANT
DRSG OPSITE POSTOP 4X10 (GAUZE/BANDAGES/DRESSINGS) IMPLANT
DRSG OPSITE POSTOP 4X8 (GAUZE/BANDAGES/DRESSINGS) IMPLANT
DRSG VAC ATS LRG SENSATRAC (GAUZE/BANDAGES/DRESSINGS) ×2 IMPLANT
ELECT BLADE 6.5 EXT (BLADE) IMPLANT
ELECT CAUTERY BLADE 6.4 (BLADE) ×3 IMPLANT
ELECT REM PT RETURN 9FT ADLT (ELECTROSURGICAL) ×3
ELECTRODE REM PT RTRN 9FT ADLT (ELECTROSURGICAL) ×1 IMPLANT
GLOVE BIO SURGEON STRL SZ8 (GLOVE) ×2 IMPLANT
GLOVE BIOGEL M STRL SZ7.5 (GLOVE) ×3 IMPLANT
GLOVE BIOGEL PI IND STRL 7.5 (GLOVE) IMPLANT
GLOVE BIOGEL PI IND STRL 8 (GLOVE) ×1 IMPLANT
GLOVE BIOGEL PI IND STRL 8.5 (GLOVE) IMPLANT
GLOVE BIOGEL PI INDICATOR 7.5 (GLOVE) ×4
GLOVE BIOGEL PI INDICATOR 8 (GLOVE) ×2
GLOVE BIOGEL PI INDICATOR 8.5 (GLOVE) ×2
GLOVE ECLIPSE 7.5 STRL STRAW (GLOVE) ×2 IMPLANT
GOWN STRL REUS W/ TWL LRG LVL3 (GOWN DISPOSABLE) ×1 IMPLANT
GOWN STRL REUS W/ TWL XL LVL3 (GOWN DISPOSABLE) ×1 IMPLANT
GOWN STRL REUS W/TWL LRG LVL3 (GOWN DISPOSABLE) ×3
GOWN STRL REUS W/TWL XL LVL3 (GOWN DISPOSABLE) ×3
HEMOSTAT ARISTA ABSORB 3G PWDR (MISCELLANEOUS) ×2 IMPLANT
KIT BASIN OR (CUSTOM PROCEDURE TRAY) ×3 IMPLANT
KIT ROOM TURNOVER OR (KITS) ×3 IMPLANT
LIGASURE IMPACT 36 18CM CVD LR (INSTRUMENTS) IMPLANT
NS IRRIG 1000ML POUR BTL (IV SOLUTION) ×6 IMPLANT
PACK GENERAL/GYN (CUSTOM PROCEDURE TRAY) ×3 IMPLANT
PAD ARMBOARD 7.5X6 YLW CONV (MISCELLANEOUS) ×3 IMPLANT
SEALANT PATCH FIBRIN 2X4IN (MISCELLANEOUS) ×4 IMPLANT
SPECIMEN JAR LARGE (MISCELLANEOUS) ×2 IMPLANT
SPONGE ABDOMINAL VAC ABTHERA (MISCELLANEOUS) ×2 IMPLANT
SPONGE LAP 18X18 X RAY DECT (DISPOSABLE) ×8 IMPLANT
STAPLER VISISTAT 35W (STAPLE) ×3 IMPLANT
SUCTION POOLE TIP (SUCTIONS) ×3 IMPLANT
SUT PDS AB 1 TP1 96 (SUTURE) ×6 IMPLANT
SUT SILK 2 0 SH CR/8 (SUTURE) ×3 IMPLANT
SUT SILK 2 0 TIES 10X30 (SUTURE) ×3 IMPLANT
SUT SILK 3 0 SH CR/8 (SUTURE) ×3 IMPLANT
SUT SILK 3 0 TIES 10X30 (SUTURE) ×3 IMPLANT
SUT VIC AB 3-0 SH 18 (SUTURE) IMPLANT
TOWEL OR 17X26 10 PK STRL BLUE (TOWEL DISPOSABLE) ×3 IMPLANT
TRAY FOLEY CATH 16FRSI W/METER (SET/KITS/TRAYS/PACK) IMPLANT
YANKAUER SUCT BULB TIP NO VENT (SUCTIONS) IMPLANT

## 2016-01-03 NOTE — Progress Notes (Signed)
CSW provided emotional support to pt's family at the bedside.  Unit CSW will f/u and assist with disposition, as necessary.
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
## 2016-01-03 NOTE — ED Notes (Signed)
Chest Tube Placed by MD Andrey CampanileWilson to right thorax.

## 2016-01-03 NOTE — ED Notes (Signed)
Blood Units 1 and 2 finished via pressure bags.
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
## 2016-01-03 NOTE — ED Notes (Signed)
MDs attempting intubation 
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
## 2016-01-03 NOTE — ED Notes (Signed)
MD Andrey CampanileWilson attempting to place chest tube at this time.

## 2016-01-03 NOTE — ED Notes (Signed)
1st and 2nd Units of emergency release blood started via pressure bags per MD Andrey CampanileWilson. Verified units with Wynell BalloonMorgan B. RN.
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
## 2016-01-04 ENCOUNTER — Encounter (HOSPITAL_COMMUNITY): Payer: Self-pay | Admitting: Certified Registered"

## 2016-01-04 ENCOUNTER — Emergency Department (HOSPITAL_COMMUNITY): Payer: Medicaid Other | Admitting: Certified Registered"

## 2016-01-04 ENCOUNTER — Inpatient Hospital Stay (HOSPITAL_COMMUNITY): Payer: Medicaid Other

## 2016-01-04 DIAGNOSIS — I82629 Acute embolism and thrombosis of deep veins of unspecified upper extremity: Secondary | ICD-10-CM | POA: Diagnosis not present

## 2016-01-04 DIAGNOSIS — J690 Pneumonitis due to inhalation of food and vomit: Secondary | ICD-10-CM | POA: Diagnosis not present

## 2016-01-04 DIAGNOSIS — Y95 Nosocomial condition: Secondary | ICD-10-CM | POA: Diagnosis not present

## 2016-01-04 DIAGNOSIS — N151 Renal and perinephric abscess: Secondary | ICD-10-CM | POA: Diagnosis not present

## 2016-01-04 DIAGNOSIS — I82C19 Acute embolism and thrombosis of unspecified internal jugular vein: Secondary | ICD-10-CM | POA: Diagnosis not present

## 2016-01-04 DIAGNOSIS — A419 Sepsis, unspecified organism: Secondary | ICD-10-CM | POA: Diagnosis not present

## 2016-01-04 DIAGNOSIS — G9340 Encephalopathy, unspecified: Secondary | ICD-10-CM | POA: Diagnosis not present

## 2016-01-04 DIAGNOSIS — E8770 Fluid overload, unspecified: Secondary | ICD-10-CM | POA: Diagnosis not present

## 2016-01-04 DIAGNOSIS — W3400XA Accidental discharge from unspecified firearms or gun, initial encounter: Secondary | ICD-10-CM

## 2016-01-04 DIAGNOSIS — J9601 Acute respiratory failure with hypoxia: Secondary | ICD-10-CM | POA: Diagnosis present

## 2016-01-04 DIAGNOSIS — E872 Acidosis: Secondary | ICD-10-CM | POA: Diagnosis not present

## 2016-01-04 DIAGNOSIS — K661 Hemoperitoneum: Secondary | ICD-10-CM | POA: Diagnosis present

## 2016-01-04 DIAGNOSIS — S36113A Laceration of liver, unspecified degree, initial encounter: Secondary | ICD-10-CM | POA: Diagnosis present

## 2016-01-04 DIAGNOSIS — K750 Abscess of liver: Secondary | ICD-10-CM | POA: Diagnosis not present

## 2016-01-04 DIAGNOSIS — E871 Hypo-osmolality and hyponatremia: Secondary | ICD-10-CM | POA: Diagnosis not present

## 2016-01-04 DIAGNOSIS — A047 Enterocolitis due to Clostridium difficile: Secondary | ICD-10-CM | POA: Diagnosis not present

## 2016-01-04 DIAGNOSIS — J942 Hemothorax: Secondary | ICD-10-CM | POA: Diagnosis present

## 2016-01-04 DIAGNOSIS — S272XXA Traumatic hemopneumothorax, initial encounter: Secondary | ICD-10-CM | POA: Diagnosis present

## 2016-01-04 DIAGNOSIS — D696 Thrombocytopenia, unspecified: Secondary | ICD-10-CM | POA: Diagnosis present

## 2016-01-04 DIAGNOSIS — N170 Acute kidney failure with tubular necrosis: Secondary | ICD-10-CM | POA: Diagnosis not present

## 2016-01-04 DIAGNOSIS — R740 Nonspecific elevation of levels of transaminase and lactic acid dehydrogenase [LDH]: Secondary | ICD-10-CM | POA: Diagnosis present

## 2016-01-04 DIAGNOSIS — S37031A Laceration of right kidney, unspecified degree, initial encounter: Secondary | ICD-10-CM | POA: Diagnosis present

## 2016-01-04 DIAGNOSIS — D62 Acute posthemorrhagic anemia: Secondary | ICD-10-CM | POA: Diagnosis not present

## 2016-01-04 DIAGNOSIS — R5383 Other fatigue: Secondary | ICD-10-CM | POA: Diagnosis present

## 2016-01-04 DIAGNOSIS — R6521 Severe sepsis with septic shock: Secondary | ICD-10-CM | POA: Diagnosis not present

## 2016-01-04 DIAGNOSIS — T148 Other injury of unspecified body region: Secondary | ICD-10-CM | POA: Diagnosis not present

## 2016-01-04 DIAGNOSIS — N390 Urinary tract infection, site not specified: Secondary | ICD-10-CM | POA: Diagnosis not present

## 2016-01-04 DIAGNOSIS — S2241XA Multiple fractures of ribs, right side, initial encounter for closed fracture: Secondary | ICD-10-CM | POA: Diagnosis present

## 2016-01-04 DIAGNOSIS — S21139A Puncture wound without foreign body of unspecified front wall of thorax without penetration into thoracic cavity, initial encounter: Secondary | ICD-10-CM | POA: Diagnosis present

## 2016-01-04 LAB — PREPARE FRESH FROZEN PLASMA
Unit division: 0
Unit division: 0
Unit division: 0
Unit division: 0

## 2016-01-04 LAB — CBC
HCT: 37.3 % — ABNORMAL LOW (ref 39.0–52.0)
HCT: 32.8 % — ABNORMAL LOW (ref 39.0–52.0)
HCT: 27.8 % — ABNORMAL LOW (ref 39.0–52.0)
Hemoglobin: 12.3 g/dL — ABNORMAL LOW (ref 13.0–17.0)
Hemoglobin: 11 g/dL — ABNORMAL LOW (ref 13.0–17.0)
Hemoglobin: 9.4 g/dL — ABNORMAL LOW (ref 13.0–17.0)
MCH: 29.1 pg (ref 26.0–34.0)
MCH: 28.9 pg (ref 26.0–34.0)
MCH: 28.8 pg (ref 26.0–34.0)
MCHC: 33 g/dL (ref 30.0–36.0)
MCHC: 33.5 g/dL (ref 30.0–36.0)
MCHC: 33.8 g/dL (ref 30.0–36.0)
MCV: 88.2 fL (ref 78.0–100.0)
MCV: 86.3 fL (ref 78.0–100.0)
MCV: 85.3 fL (ref 78.0–100.0)
Platelets: 98 10*3/uL — ABNORMAL LOW (ref 150–400)
Platelets: 72 10*3/uL — ABNORMAL LOW (ref 150–400)
Platelets: 64 10*3/uL — ABNORMAL LOW (ref 150–400)
RBC: 4.23 MIL/uL (ref 4.22–5.81)
RBC: 3.8 MIL/uL — ABNORMAL LOW (ref 4.22–5.81)
RBC: 3.26 MIL/uL — ABNORMAL LOW (ref 4.22–5.81)
RDW: 14.4 % (ref 11.5–15.5)
RDW: 15.2 % (ref 11.5–15.5)
RDW: 15.2 % (ref 11.5–15.5)
WBC: 14.4 10*3/uL — ABNORMAL HIGH (ref 4.0–10.5)
WBC: 9.6 10*3/uL (ref 4.0–10.5)
WBC: 11.5 10*3/uL — ABNORMAL HIGH (ref 4.0–10.5)

## 2016-01-04 LAB — POCT I-STAT 7, (LYTES, BLD GAS, ICA,H+H)
Acid-base deficit: 6 mmol/L — ABNORMAL HIGH (ref 0.0–2.0)
Acid-base deficit: 5 mmol/L — ABNORMAL HIGH (ref 0.0–2.0)
Bicarbonate: 21.3 mEq/L (ref 20.0–24.0)
Bicarbonate: 21.4 mEq/L (ref 20.0–24.0)
Calcium, Ion: 0.78 mmol/L — ABNORMAL LOW (ref 1.12–1.23)
Calcium, Ion: 1.01 mmol/L — ABNORMAL LOW (ref 1.12–1.23)
HCT: 30 % — ABNORMAL LOW (ref 39.0–52.0)
HCT: 21 % — ABNORMAL LOW (ref 39.0–52.0)
Hemoglobin: 10.2 g/dL — ABNORMAL LOW (ref 13.0–17.0)
Hemoglobin: 7.1 g/dL — ABNORMAL LOW (ref 13.0–17.0)
O2 Saturation: 100 %
O2 Saturation: 100 %
Patient temperature: 34.8
Patient temperature: 35.7
Potassium: 4.5 mmol/L (ref 3.5–5.1)
Potassium: 3.8 mmol/L (ref 3.5–5.1)
Sodium: 144 mmol/L (ref 135–145)
Sodium: 144 mmol/L (ref 135–145)
TCO2: 23 mmol/L (ref 0–100)
TCO2: 23 mmol/L (ref 0–100)
pCO2 arterial: 47.5 mmHg — ABNORMAL HIGH (ref 35.0–45.0)
pCO2 arterial: 41.8 mmHg (ref 35.0–45.0)
pH, Arterial: 7.249 — ABNORMAL LOW (ref 7.350–7.450)
pH, Arterial: 7.311 — ABNORMAL LOW (ref 7.350–7.450)
pO2, Arterial: 261 mmHg — ABNORMAL HIGH (ref 80.0–100.0)
pO2, Arterial: 401 mmHg — ABNORMAL HIGH (ref 80.0–100.0)

## 2016-01-04 LAB — COMPREHENSIVE METABOLIC PANEL
ALT: 266 U/L — ABNORMAL HIGH (ref 17–63)
AST: 316 U/L — ABNORMAL HIGH (ref 15–41)
Albumin: 2.8 g/dL — ABNORMAL LOW (ref 3.5–5.0)
Alkaline Phosphatase: 45 U/L (ref 38–126)
Anion gap: 4 — ABNORMAL LOW (ref 5–15)
BUN: 6 mg/dL (ref 6–20)
CO2: 23 mmol/L (ref 22–32)
Calcium: 7.1 mg/dL — ABNORMAL LOW (ref 8.9–10.3)
Chloride: 112 mmol/L — ABNORMAL HIGH (ref 101–111)
Creatinine, Ser: 1.19 mg/dL (ref 0.61–1.24)
GFR calc Af Amer: >60 mL/min (ref >60)
GFR calc non Af Amer: >60 mL/min (ref >60)
Glucose, Bld: 111 mg/dL — ABNORMAL HIGH (ref 65–99)
Potassium: 4.4 mmol/L (ref 3.5–5.1)
Sodium: 139 mmol/L (ref 135–145)
Total Bilirubin: 1.5 mg/dL — ABNORMAL HIGH (ref 0.3–1.2)
Total Protein: 5 g/dL — ABNORMAL LOW (ref 6.5–8.1)

## 2016-01-04 LAB — FIBRINOGEN: Fibrinogen: 271 mg/dL (ref 204–475)

## 2016-01-04 LAB — DIC (DISSEMINATED INTRAVASCULAR COAGULATION)PANEL
D-Dimer, Quant: 11.19 ug/mL-FEU — ABNORMAL HIGH (ref 0.00–0.50)
INR: 1.4 (ref 0.00–1.49)
Platelets: 98 10*3/uL — ABNORMAL LOW (ref 150–400)
Smear Review: NONE SEEN
aPTT: 29 seconds (ref 24–37)

## 2016-01-04 LAB — BLOOD GAS, ARTERIAL
Acid-base deficit: 1.6 mmol/L (ref 0.0–2.0)
Acid-base deficit: 1.2 mmol/L (ref 0.0–2.0)
Bicarbonate: 22.3 mEq/L (ref 20.0–24.0)
Bicarbonate: 23.1 mEq/L (ref 20.0–24.0)
Drawn by: 405301
Drawn by: 460661
FIO2: 0.6
FIO2: 0.5
MECHVT: 620 mL
MECHVT: 600 mL
O2 Saturation: 99.5 %
O2 Saturation: 99.5 %
PEEP: 5 cmH2O
PEEP: 5 cmH2O
Patient temperature: 97.5
Patient temperature: 98.5
RATE: 15 resp/min
RATE: 15 resp/min
TCO2: 23.4 mmol/L (ref 0–100)
TCO2: 24.3 mmol/L (ref 0–100)
pCO2 arterial: 34.3 mmHg — ABNORMAL LOW (ref 35.0–45.0)
pCO2 arterial: 39.6 mmHg (ref 35.0–45.0)
pH, Arterial: 7.426 (ref 7.350–7.450)
pH, Arterial: 7.383 (ref 7.350–7.450)
pO2, Arterial: 189 mmHg — ABNORMAL HIGH (ref 80.0–100.0)
pO2, Arterial: 159 mmHg — ABNORMAL HIGH (ref 80.0–100.0)

## 2016-01-04 LAB — DIC (DISSEMINATED INTRAVASCULAR COAGULATION) PANEL
Fibrinogen: 182 mg/dL — ABNORMAL LOW (ref 204–475)
Prothrombin Time: 17.3 seconds — ABNORMAL HIGH (ref 11.6–15.2)

## 2016-01-04 LAB — MRSA PCR SCREENING: MRSA by PCR: NEGATIVE

## 2016-01-04 LAB — BASIC METABOLIC PANEL
Anion gap: 7 (ref 5–15)
BUN: 8 mg/dL (ref 6–20)
CO2: 23 mmol/L (ref 22–32)
Calcium: 7.7 mg/dL — ABNORMAL LOW (ref 8.9–10.3)
Chloride: 112 mmol/L — ABNORMAL HIGH (ref 101–111)
Creatinine, Ser: 1.21 mg/dL (ref 0.61–1.24)
GFR calc Af Amer: >60 mL/min (ref >60)
GFR calc non Af Amer: >60 mL/min (ref >60)
Glucose, Bld: 94 mg/dL (ref 65–99)
Potassium: 3.9 mmol/L (ref 3.5–5.1)
Sodium: 142 mmol/L (ref 135–145)

## 2016-01-04 LAB — TRIGLYCERIDES: Triglycerides: 127 mg/dL (ref <150)

## 2016-01-04 LAB — CDS SEROLOGY

## 2016-01-04 LAB — PROTIME-INR
INR: 1.4 (ref 0.00–1.49)
Prothrombin Time: 17.2 seconds — ABNORMAL HIGH (ref 11.6–15.2)

## 2016-01-04 LAB — BLOOD PRODUCT ORDER (VERBAL) VERIFICATION

## 2016-01-04 MED ORDER — CALCIUM GLUCONATE 10 % IV SOLN
1.0000 g | Freq: Once | INTRAVENOUS | Status: AC
  Administered 2016-01-04: 1 g via INTRAVENOUS
  Filled 2016-01-04: qty 10

## 2016-01-04 MED ORDER — FENTANYL CITRATE (PF) 250 MCG/5ML IJ SOLN
INTRAMUSCULAR | Status: DC | PRN
  Administered 2016-01-04: 100 ug via INTRAVENOUS
  Administered 2016-01-04: 50 ug via INTRAVENOUS
  Administered 2016-01-04: 100 ug via INTRAVENOUS

## 2016-01-04 MED ORDER — CEFAZOLIN SODIUM-DEXTROSE 2-3 GM-% IV SOLR
INTRAVENOUS | Status: DC | PRN
  Administered 2016-01-04: 2 g via INTRAVENOUS

## 2016-01-04 MED ORDER — FAMOTIDINE IN NACL 20-0.9 MG/50ML-% IV SOLN
20.0000 mg | Freq: Two times a day (BID) | INTRAVENOUS | Status: DC
  Administered 2016-01-04 – 2016-01-09 (×10): 20 mg via INTRAVENOUS
  Filled 2016-01-04 (×10): qty 50

## 2016-01-04 MED ORDER — SODIUM CHLORIDE 0.9 % IV SOLN
INTRAVENOUS | Status: DC | PRN
  Administered 2016-01-04 (×2): via INTRAVENOUS

## 2016-01-04 MED ORDER — ROCURONIUM BROMIDE 100 MG/10ML IV SOLN
INTRAVENOUS | Status: DC | PRN
  Administered 2016-01-04: 50 mg via INTRAVENOUS

## 2016-01-04 MED ORDER — FENTANYL BOLUS VIA INFUSION
50.0000 ug | INTRAVENOUS | Status: DC | PRN
  Administered 2016-01-05 (×4): 200 ug via INTRAVENOUS
  Administered 2016-01-07: 50 ug via INTRAVENOUS
  Filled 2016-01-04 (×5): qty 50

## 2016-01-04 MED ORDER — ANTISEPTIC ORAL RINSE SOLUTION (CORINZ)
7.0000 mL | OROMUCOSAL | Status: DC
  Administered 2016-01-04 – 2016-01-08 (×45): 7 mL via OROMUCOSAL

## 2016-01-04 MED ORDER — FENTANYL CITRATE (PF) 100 MCG/2ML IJ SOLN
50.0000 ug | Freq: Once | INTRAMUSCULAR | Status: DC
Start: 2016-01-04 — End: 2016-01-05

## 2016-01-04 MED ORDER — CEFAZOLIN SODIUM 1 G IJ SOLR
INTRAMUSCULAR | Status: AC
  Filled 2016-01-04: qty 30

## 2016-01-04 MED ORDER — PANTOPRAZOLE SODIUM 40 MG PO TBEC
40.0000 mg | DELAYED_RELEASE_TABLET | Freq: Two times a day (BID) | ORAL | Status: DC
  Filled 2016-01-04: qty 1

## 2016-01-04 MED ORDER — GELATIN ABSORBABLE 12-7 MM EX MISC
CUTANEOUS | Status: AC
  Filled 2016-01-04: qty 2

## 2016-01-04 MED ORDER — SODIUM CHLORIDE 0.9 % IV SOLN
Freq: Once | INTRAVENOUS | Status: AC
  Administered 2016-01-04: 05:00:00 via INTRAVENOUS

## 2016-01-04 MED ORDER — IOPAMIDOL (ISOVUE-300) INJECTION 61%
INTRAVENOUS | Status: AC
  Administered 2016-01-04: 60 mL
  Filled 2016-01-04: qty 100

## 2016-01-04 MED ORDER — 0.9 % SODIUM CHLORIDE (POUR BTL) OPTIME
TOPICAL | Status: DC | PRN
  Administered 2016-01-04: 1000 mL

## 2016-01-04 MED ORDER — POTASSIUM CHLORIDE IN NACL 20-0.9 MEQ/L-% IV SOLN
INTRAVENOUS | Status: DC
  Administered 2016-01-04 – 2016-01-06 (×6): via INTRAVENOUS
  Administered 2016-01-06: 125 mL/h via INTRAVENOUS
  Administered 2016-01-07 – 2016-01-09 (×4): via INTRAVENOUS
  Filled 2016-01-04 (×20): qty 1000

## 2016-01-04 MED ORDER — LACTATED RINGERS IV SOLN
INTRAVENOUS | Status: DC | PRN
  Administered 2016-01-04: 01:00:00 via INTRAVENOUS

## 2016-01-04 MED ORDER — CHLORHEXIDINE GLUCONATE 0.12% ORAL RINSE (MEDLINE KIT)
15.0000 mL | Freq: Two times a day (BID) | OROMUCOSAL | Status: DC
  Administered 2016-01-04 – 2016-01-08 (×8): 15 mL via OROMUCOSAL

## 2016-01-04 MED ORDER — SODIUM CHLORIDE 0.9 % IV SOLN
25.0000 ug/h | INTRAVENOUS | Status: DC
  Administered 2016-01-04: 100 ug/h via INTRAVENOUS
  Administered 2016-01-04 – 2016-01-05 (×2): 150 ug/h via INTRAVENOUS
  Administered 2016-01-05: 175 ug/h via INTRAVENOUS
  Administered 2016-01-06: 75 ug/h via INTRAVENOUS
  Administered 2016-01-06: 175 ug/h via INTRAVENOUS
  Administered 2016-01-07 (×3): 350 ug/h via INTRAVENOUS
  Administered 2016-01-08: 300 ug/h via INTRAVENOUS
  Administered 2016-01-09 (×2): 350 ug/h via INTRAVENOUS
  Administered 2016-01-09: 300 ug/h via INTRAVENOUS
  Administered 2016-01-10 (×3): 350 ug/h via INTRAVENOUS
  Administered 2016-01-11 – 2016-01-13 (×7): 400 ug/h via INTRAVENOUS
  Filled 2016-01-04 (×27): qty 50

## 2016-01-04 MED ORDER — PROPOFOL 1000 MG/100ML IV EMUL
0.0000 ug/kg/min | INTRAVENOUS | Status: DC
  Administered 2016-01-04: 25 ug/kg/min via INTRAVENOUS
  Administered 2016-01-04: 30 ug/kg/min via INTRAVENOUS
  Administered 2016-01-04: 25 ug/kg/min via INTRAVENOUS
  Administered 2016-01-04 (×3): 50 ug/kg/min via INTRAVENOUS
  Administered 2016-01-05 (×2): 40 ug/kg/min via INTRAVENOUS
  Administered 2016-01-05: 29.952 ug/kg/min via INTRAVENOUS
  Administered 2016-01-05 (×2): 30 ug/kg/min via INTRAVENOUS
  Administered 2016-01-06 (×2): 40 ug/kg/min via INTRAVENOUS
  Administered 2016-01-06: 20 ug/kg/min via INTRAVENOUS
  Administered 2016-01-06 (×2): 40 ug/kg/min via INTRAVENOUS
  Administered 2016-01-06: 50 ug/kg/min via INTRAVENOUS
  Administered 2016-01-06: 40 ug/kg/min via INTRAVENOUS
  Administered 2016-01-07 – 2016-01-08 (×10): 50 ug/kg/min via INTRAVENOUS
  Administered 2016-01-08: 35 ug/kg/min via INTRAVENOUS
  Administered 2016-01-09: 25 ug/kg/min via INTRAVENOUS
  Administered 2016-01-09 (×5): 50 ug/kg/min via INTRAVENOUS
  Administered 2016-01-10 (×3): 35 ug/kg/min via INTRAVENOUS
  Filled 2016-01-04 (×23): qty 100
  Filled 2016-01-04: qty 200
  Filled 2016-01-04 (×9): qty 100

## 2016-01-04 MED ORDER — DIPHENHYDRAMINE HCL 50 MG/ML IJ SOLN
25.0000 mg | Freq: Once | INTRAMUSCULAR | Status: AC
  Administered 2016-01-04: 25 mg via INTRAVENOUS
  Filled 2016-01-04: qty 1

## 2016-01-04 MED ORDER — ROCURONIUM BROMIDE 50 MG/5ML IV SOLN
INTRAVENOUS | Status: AC
  Filled 2016-01-04: qty 1

## 2016-01-04 NOTE — Anesthesia Postprocedure Evaluation (Signed)
Anesthesia Post Note  Patient: Samuel Owens  Procedure(s) Performed: Procedure(s) (LRB): EXPLORATORY LAPAROTOMY (N/Owens) HEPATORRHAPHY (N/Owens) CHOLECYSTECTOMY (N/Owens) APPLICATION OF WOUND VAC (N/Owens)  Patient location during evaluation: ICU Anesthesia Type: General Level of consciousness: sedated Pain management: pain level controlled Vital Signs Assessment: post-procedure vital signs reviewed and stable Respiratory status: patient on ventilator - see flowsheet for VS and patient remains intubated per anesthesia plan Cardiovascular status: blood pressure returned to baseline and stable Postop Assessment: no signs of nausea or vomiting Anesthetic complications: no    Last Vitals:  Filed Vitals:   01/04/16 1800 01/04/16 1900  BP: 90/56 107/63  Pulse: 110 106  Temp:    Resp: 15 15    Last Pain:  Filed Vitals:   01/04/16 1915  PainSc: 7                  Samuel Owens

## 2016-01-04 NOTE — ED Notes (Signed)
Key to ED valuables safe given to [employer]. Belongings in safe include Wristwatch, $40.73, and Cellphone.
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
## 2016-01-04 NOTE — Anesthesia Preprocedure Evaluation (Signed)
Anesthesia Evaluation  Patient identified by MRN, date of birth, ID band Patient unresponsive  Preop documentation limited or incomplete due to emergent nature of procedure.  Airway Mallampati: Intubated       Dental   Pulmonary           Cardiovascular      Neuro/Psych    GI/Hepatic   Endo/Other    Renal/GU      Musculoskeletal   Abdominal   Peds  Hematology   Anesthesia Other Findings   Reproductive/Obstetrics                             Anesthesia Physical Anesthesia Plan  ASA: IV and emergent  Anesthesia Plan: General   Post-op Pain Management:    Induction: Intravenous and Inhalational  Airway Management Planned: Oral ETT  Additional Equipment:   Intra-op Plan:   Post-operative Plan: Post-operative intubation/ventilation  Informed Consent:   History available from chart only and Only emergency history available  Plan Discussed with: Anesthesiologist and Surgeon  Anesthesia Plan Comments:         Anesthesia Quick Evaluation

## 2016-01-04 NOTE — Transfer of Care (Signed)
Immediate Anesthesia Transfer of Care Note  Patient: Samuel Owens  Procedure(s) Performed: Procedure(s): EXPLORATORY LAPAROTOMY (N/A) HEPATORRHAPHY (N/A) CHOLECYSTECTOMY (N/A) APPLICATION OF WOUND VAC (N/A)  Patient Location: ICU  Anesthesia Type:General  Level of Consciousness: Patient remains intubated per anesthesia plan  Airway & Oxygen Therapy: Patient remains intubated per anesthesia plan and Patient placed on Ventilator (see vital sign flow sheet for setting)  Post-op Assessment: Report given to RN and Post -op Vital signs reviewed and stable  Post vital signs: Reviewed and stable  Last Vitals:  Filed Vitals:   01/03/16 2240 01/03/16 2241  BP: 148/89 155/106  Pulse:  88  Resp: 28 18    Last Pain: There were no vitals filed for this visit.       Complications: No apparent anesthesia complications
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
## 2016-01-04 NOTE — ED Notes (Signed)
Key for Patient's belongings which were secured in ED safe given to HCA IncMark Sebenge

## 2016-01-04 NOTE — Progress Notes (Signed)
Mom provided with key to ED locker. I instructed mom to take key to ED and take her sons belongings. I instructed her to leave the key in the ED and not take the key home. She shows understanding.
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
## 2016-01-04 NOTE — Procedures (Signed)
Pt arrived from OR and placed on previous vent settings.
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
## 2016-01-04 NOTE — Procedures (Signed)
Interventional Radiology Procedure Note  Procedure:  1. Placement of a right IJ vascular access: 62F sheath 2. Celiac and hepatic angiogram shows no active arterial bleeding.  Large area of parenchymal injury with intrahepatic arterioportal shunting. 3. Gel-foam embolization of right and middle hepatic arteries.  4. Right renal angiogram shows no active bleeding.  Large parenchymal defect in upper and interpolar region.   Complications: None.   Estimated Blood Loss: None.  Recommendations:  - To OR for ex-lap.   Signed,  Sterling BigHeath K. McCullough, MD
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
## 2016-01-05 ENCOUNTER — Inpatient Hospital Stay (HOSPITAL_COMMUNITY): Payer: Medicaid Other | Admitting: Anesthesiology

## 2016-01-05 ENCOUNTER — Inpatient Hospital Stay (HOSPITAL_COMMUNITY): Payer: Medicaid Other

## 2016-01-05 ENCOUNTER — Encounter (HOSPITAL_COMMUNITY): Admission: EM | Disposition: A | Discharge status: Home Health | Payer: Self-pay | Source: Home / Self Care

## 2016-01-05 HISTORY — PX: LAPAROTOMY: SHX154

## 2016-01-05 LAB — POCT I-STAT 3, ART BLOOD GAS (G3+)
Acid-base deficit: 4 mmol/L — ABNORMAL HIGH (ref 0.0–2.0)
Bicarbonate: 21.7 mEq/L (ref 20.0–24.0)
O2 Saturation: 98 %
Patient temperature: 100.4
TCO2: 23 mmol/L (ref 0–100)
pCO2 arterial: 43.2 mmHg (ref 35.0–45.0)
pH, Arterial: 7.313 — ABNORMAL LOW (ref 7.350–7.450)
pO2, Arterial: 126 mmHg — ABNORMAL HIGH (ref 80.0–100.0)

## 2016-01-05 LAB — PREPARE CRYOPRECIPITATE
Unit division: 0
Unit division: 0

## 2016-01-05 LAB — BASIC METABOLIC PANEL
Anion gap: 4 — ABNORMAL LOW (ref 5–15)
BUN: 14 mg/dL (ref 6–20)
CO2: 23 mmol/L (ref 22–32)
Calcium: 8 mg/dL — ABNORMAL LOW (ref 8.9–10.3)
Chloride: 114 mmol/L — ABNORMAL HIGH (ref 101–111)
Creatinine, Ser: 1.79 mg/dL — ABNORMAL HIGH (ref 0.61–1.24)
GFR calc Af Amer: >60 mL/min (ref >60)
GFR calc non Af Amer: 53 mL/min — ABNORMAL LOW (ref >60)
Glucose, Bld: 99 mg/dL (ref 65–99)
Potassium: 4.5 mmol/L (ref 3.5–5.1)
Sodium: 141 mmol/L (ref 135–145)

## 2016-01-05 LAB — CBC
HCT: 32.9 % — ABNORMAL LOW (ref 39.0–52.0)
HCT: 27.3 % — ABNORMAL LOW (ref 39.0–52.0)
HCT: 27.3 % — ABNORMAL LOW (ref 39.0–52.0)
Hemoglobin: 10.8 g/dL — ABNORMAL LOW (ref 13.0–17.0)
Hemoglobin: 8.9 g/dL — ABNORMAL LOW (ref 13.0–17.0)
Hemoglobin: 8.9 g/dL — ABNORMAL LOW (ref 13.0–17.0)
MCH: 28.6 pg (ref 26.0–34.0)
MCH: 28.6 pg (ref 26.0–34.0)
MCH: 28.7 pg (ref 26.0–34.0)
MCHC: 32.8 g/dL (ref 30.0–36.0)
MCHC: 32.6 g/dL (ref 30.0–36.0)
MCHC: 32.6 g/dL (ref 30.0–36.0)
MCV: 87.3 fL (ref 78.0–100.0)
MCV: 87.8 fL (ref 78.0–100.0)
MCV: 88.1 fL (ref 78.0–100.0)
Platelets: 82 10*3/uL — ABNORMAL LOW (ref 150–400)
Platelets: 79 10*3/uL — ABNORMAL LOW (ref 150–400)
Platelets: 78 10*3/uL — ABNORMAL LOW (ref 150–400)
RBC: 3.77 MIL/uL — ABNORMAL LOW (ref 4.22–5.81)
RBC: 3.11 MIL/uL — ABNORMAL LOW (ref 4.22–5.81)
RBC: 3.1 MIL/uL — ABNORMAL LOW (ref 4.22–5.81)
RDW: 15.5 % (ref 11.5–15.5)
RDW: 15.3 % (ref 11.5–15.5)
RDW: 15.3 % (ref 11.5–15.5)
WBC: 13 10*3/uL — ABNORMAL HIGH (ref 4.0–10.5)
WBC: 10.5 10*3/uL (ref 4.0–10.5)
WBC: 11 10*3/uL — ABNORMAL HIGH (ref 4.0–10.5)

## 2016-01-05 LAB — PROTIME-INR
INR: 1.8 — ABNORMAL HIGH (ref 0.00–1.49)
Prothrombin Time: 20.9 seconds — ABNORMAL HIGH (ref 11.6–15.2)

## 2016-01-05 LAB — POCT I-STAT 4, (NA,K, GLUC, HGB,HCT)
Glucose, Bld: 98 mg/dL (ref 65–99)
HCT: 31 % — ABNORMAL LOW (ref 39.0–52.0)
Hemoglobin: 10.5 g/dL — ABNORMAL LOW (ref 13.0–17.0)
Potassium: 4.7 mmol/L (ref 3.5–5.1)
Sodium: 144 mmol/L (ref 135–145)

## 2016-01-05 LAB — PREPARE RBC (CROSSMATCH)

## 2016-01-05 SURGERY — LAPAROTOMY, EXPLORATORY
Anesthesia: General | Site: Abdomen

## 2016-01-05 MED ORDER — MIDAZOLAM HCL 2 MG/2ML IJ SOLN
INTRAMUSCULAR | Status: AC
  Filled 2016-01-05: qty 4

## 2016-01-05 MED ORDER — CEFAZOLIN SODIUM 1 G IJ SOLR
INTRAMUSCULAR | Status: AC
  Filled 2016-01-05: qty 20

## 2016-01-05 MED ORDER — 0.9 % SODIUM CHLORIDE (POUR BTL) OPTIME
TOPICAL | Status: DC | PRN
  Administered 2016-01-05 (×6): 1000 mL

## 2016-01-05 MED ORDER — ROCURONIUM BROMIDE 100 MG/10ML IV SOLN
INTRAVENOUS | Status: DC | PRN
  Administered 2016-01-05 (×2): 50 mg via INTRAVENOUS

## 2016-01-05 MED ORDER — MIDAZOLAM HCL 2 MG/2ML IJ SOLN
INTRAMUSCULAR | Status: DC | PRN
  Administered 2016-01-05: 4 mg via INTRAVENOUS

## 2016-01-05 MED ORDER — EVICEL 5 ML EX KIT
PACK | CUTANEOUS | Status: AC
  Filled 2016-01-05: qty 1

## 2016-01-05 MED ORDER — ROCURONIUM BROMIDE 50 MG/5ML IV SOLN
INTRAVENOUS | Status: AC
  Filled 2016-01-05: qty 1

## 2016-01-05 MED ORDER — CEFAZOLIN SODIUM 1 G IJ SOLR
INTRAMUSCULAR | Status: DC | PRN
  Administered 2016-01-05: 2 g via INTRAMUSCULAR

## 2016-01-05 MED ORDER — SODIUM CHLORIDE 0.9 % IV SOLN
INTRAVENOUS | Status: DC | PRN
  Administered 2016-01-05: 09:00:00 via INTRAVENOUS

## 2016-01-05 MED ORDER — SODIUM CHLORIDE 0.9 % IV SOLN: 10.0000 mL/h | Freq: Once | INTRAVENOUS | Status: DC

## 2016-01-05 MED ORDER — EVICEL 5 ML EX KIT
PACK | CUTANEOUS | Status: DC | PRN
  Administered 2016-01-05: 5 mL

## 2016-01-05 SURGICAL SUPPLY — 45 items
BLADE SURG ROTATE 9660 (MISCELLANEOUS) IMPLANT
CANISTER SUCTION 2500CC (MISCELLANEOUS) ×4 IMPLANT
CHLORAPREP W/TINT 26ML (MISCELLANEOUS) ×1 IMPLANT
COVER SURGICAL LIGHT HANDLE (MISCELLANEOUS) ×4 IMPLANT
DRAIN CHANNEL 19F RND (DRAIN) ×6 IMPLANT
DRAPE LAPAROSCOPIC ABDOMINAL (DRAPES) ×4 IMPLANT
DRAPE WARM FLUID 44X44 (DRAPE) ×4 IMPLANT
DRSG OPSITE POSTOP 4X10 (GAUZE/BANDAGES/DRESSINGS) IMPLANT
DRSG OPSITE POSTOP 4X8 (GAUZE/BANDAGES/DRESSINGS) IMPLANT
DRSG PAD ABDOMINAL 8X10 ST (GAUZE/BANDAGES/DRESSINGS) ×3 IMPLANT
ELECT BLADE 6.5 EXT (BLADE) IMPLANT
ELECT CAUTERY BLADE 6.4 (BLADE) ×4 IMPLANT
ELECT REM PT RETURN 9FT ADLT (ELECTROSURGICAL) ×4
ELECTRODE REM PT RTRN 9FT ADLT (ELECTROSURGICAL) ×2 IMPLANT
EVACUATOR SILICONE 100CC (DRAIN) ×6 IMPLANT
GAUZE SPONGE 4X4 12PLY STRL (GAUZE/BANDAGES/DRESSINGS) ×3 IMPLANT
GLOVE BIOGEL M STRL SZ7.5 (GLOVE) ×4 IMPLANT
GLOVE BIOGEL PI IND STRL 8 (GLOVE) ×2 IMPLANT
GLOVE BIOGEL PI INDICATOR 8 (GLOVE) ×2
GOWN STRL REUS W/ TWL LRG LVL3 (GOWN DISPOSABLE) ×2 IMPLANT
GOWN STRL REUS W/ TWL XL LVL3 (GOWN DISPOSABLE) ×2 IMPLANT
GOWN STRL REUS W/TWL LRG LVL3 (GOWN DISPOSABLE) ×4
GOWN STRL REUS W/TWL XL LVL3 (GOWN DISPOSABLE) ×4
KIT BASIN OR (CUSTOM PROCEDURE TRAY) ×4 IMPLANT
KIT ROOM TURNOVER OR (KITS) ×4 IMPLANT
LIGASURE IMPACT 36 18CM CVD LR (INSTRUMENTS) IMPLANT
NS IRRIG 1000ML POUR BTL (IV SOLUTION) ×8 IMPLANT
PACK GENERAL/GYN (CUSTOM PROCEDURE TRAY) ×4 IMPLANT
PAD ARMBOARD 7.5X6 YLW CONV (MISCELLANEOUS) ×4 IMPLANT
SPECIMEN JAR LARGE (MISCELLANEOUS) IMPLANT
SPONGE LAP 18X18 X RAY DECT (DISPOSABLE) IMPLANT
STAPLER VISISTAT 35W (STAPLE) ×1 IMPLANT
SUCTION POOLE TIP (SUCTIONS) ×4 IMPLANT
SUT ETHILON 2 0 FS 18 (SUTURE) ×3 IMPLANT
SUT NOVA 1 T20/GS 25DT (SUTURE) ×3 IMPLANT
SUT PDS AB 1 TP1 96 (SUTURE) ×14 IMPLANT
SUT SILK 2 0 SH CR/8 (SUTURE) ×4 IMPLANT
SUT SILK 2 0 TIES 10X30 (SUTURE) ×4 IMPLANT
SUT SILK 3 0 SH CR/8 (SUTURE) ×4 IMPLANT
SUT SILK 3 0 TIES 10X30 (SUTURE) ×4 IMPLANT
SUT VIC AB 3-0 SH 18 (SUTURE) ×3 IMPLANT
TAPE CLOTH SURG 4X10 WHT LF (GAUZE/BANDAGES/DRESSINGS) ×3 IMPLANT
TOWEL OR 17X26 10 PK STRL BLUE (TOWEL DISPOSABLE) ×4 IMPLANT
TRAY FOLEY CATH 16FRSI W/METER (SET/KITS/TRAYS/PACK) IMPLANT
YANKAUER SUCT BULB TIP NO VENT (SUCTIONS) IMPLANT

## 2016-01-05 NOTE — Progress Notes (Signed)
Paged Dr. Andrey CampanileWilson regarding minimal breath sounds on right, orders received.
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
## 2016-01-05 NOTE — Transfer of Care (Signed)
Immediate Anesthesia Transfer of Care Note  Patient: Samuel Owens  Procedure(s) Performed: Procedure(s):  Re EXPLORATORY LAPAROTOMY and abdominal  closure (N/A)  Patient Location: ICU  Anesthesia Type:General  Level of Consciousness: sedated, unresponsive and Patient remains intubated per anesthesia plan  Airway & Oxygen Therapy: Patient remains intubated per anesthesia plan and Patient placed on Ventilator (see vital sign flow sheet for setting)  Post-op Assessment: Report given to RN and Post -op Vital signs reviewed and stable  Post vital signs: Reviewed and stable  Last Vitals:  Filed Vitals:   01/05/16 0600 01/05/16 0700  BP: 104/73 100/71  Pulse: 111 103  Temp:    Resp: 15 15    Last Pain:  Filed Vitals:   01/05/16 0720  PainSc: 7          Complications: No apparent anesthesia complications

## 2016-01-05 NOTE — Anesthesia Procedure Notes (Signed)
Date/Time: 01/05/2016 7:54 AM Performed by: Alanda AmassFRIEDMAN, Aeron Donaghey A Pre-anesthesia Checklist: Patient identified, Emergency Drugs available, Suction available and Patient being monitored Oxygen Delivery Method: Circle system utilized Intubation Type: Inhalational induction with existing ETT

## 2016-01-05 NOTE — Anesthesia Postprocedure Evaluation (Signed)
Anesthesia Post Note  Patient: Samuel Owens  Procedure(s) Performed: Procedure(s) (LRB):  Re EXPLORATORY LAPAROTOMY and abdominal  closure (N/A)  Patient location during evaluation: ICU Anesthesia Type: General Level of consciousness: sedated and patient remains intubated per anesthesia plan Vital Signs Assessment: post-procedure vital signs reviewed and stable Respiratory status: patient remains intubated per anesthesia plan and patient on ventilator - see flowsheet for VS Cardiovascular status: stable Anesthetic complications: no    Last Vitals:  Filed Vitals:   01/05/16 0600 01/05/16 0700  BP: 104/73 100/71  Pulse: 111 103  Temp:    Resp: 15 15    Last Pain:  Filed Vitals:   01/05/16 0720  PainSc: 7                  Alon Mazor

## 2016-01-05 NOTE — Progress Notes (Signed)
Patient picked up by anesthesia for OR procedure. Vital signs stable.
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
## 2016-01-05 NOTE — Anesthesia Preprocedure Evaluation (Addendum)
Anesthesia Evaluation  Patient identified by MRN, date of birth, ID band Patient unresponsive    Reviewed: Allergy & Precautions, Unable to perform ROS - Chart review onlyPreop documentation limited or incomplete due to emergent nature of procedure.  History of Anesthesia Complications Negative for: history of anesthetic complications  Airway Mallampati: Intubated       Dental   Pulmonary    + rhonchi        Cardiovascular  Rhythm:Regular     Neuro/Psych    GI/Hepatic   Endo/Other    Renal/GU      Musculoskeletal   Abdominal   Peds  Hematology   Anesthesia Other Findings   Reproductive/Obstetrics                            Anesthesia Physical Anesthesia Plan  ASA: IV  Anesthesia Plan: General   Post-op Pain Management:    Induction: Inhalational  Airway Management Planned: Oral ETT  Additional Equipment:   Intra-op Plan:   Post-operative Plan: Post-operative intubation/ventilation  Informed Consent:   Only emergency history available and History available from chart only  Plan Discussed with: CRNA and Surgeon  Anesthesia Plan Comments:         Anesthesia Quick Evaluation
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
## 2016-01-06 ENCOUNTER — Inpatient Hospital Stay (HOSPITAL_COMMUNITY): Payer: Medicaid Other

## 2016-01-06 DIAGNOSIS — S37001A Unspecified injury of right kidney, initial encounter: Secondary | ICD-10-CM | POA: Diagnosis present

## 2016-01-06 DIAGNOSIS — D62 Acute posthemorrhagic anemia: Secondary | ICD-10-CM | POA: Diagnosis not present

## 2016-01-06 DIAGNOSIS — S36113A Laceration of liver, unspecified degree, initial encounter: Secondary | ICD-10-CM | POA: Diagnosis present

## 2016-01-06 DIAGNOSIS — J9600 Acute respiratory failure, unspecified whether with hypoxia or hypercapnia: Secondary | ICD-10-CM | POA: Diagnosis present

## 2016-01-06 DIAGNOSIS — S272XXA Traumatic hemopneumothorax, initial encounter: Secondary | ICD-10-CM

## 2016-01-06 DIAGNOSIS — S2241XA Multiple fractures of ribs, right side, initial encounter for closed fracture: Secondary | ICD-10-CM

## 2016-01-06 HISTORY — DX: Unspecified injury of right kidney, initial encounter: S37.001A

## 2016-01-06 HISTORY — DX: Traumatic hemopneumothorax, initial encounter: S27.2XXA

## 2016-01-06 HISTORY — DX: Acute posthemorrhagic anemia: D62

## 2016-01-06 HISTORY — DX: Multiple fractures of ribs, right side, initial encounter for closed fracture: S22.41XA

## 2016-01-06 LAB — BLOOD GAS, ARTERIAL
Acid-base deficit: 1.7 mmol/L (ref 0.0–2.0)
Bicarbonate: 24 mEq/L (ref 20.0–24.0)
Drawn by: 25203
FIO2: 60
MECHVT: 600 mL
O2 Saturation: 95.9 %
PEEP: 5 cmH2O
Patient temperature: 98.6
RATE: 14 resp/min
TCO2: 25.5 mmol/L (ref 0–100)
pCO2 arterial: 51.2 mmHg — ABNORMAL HIGH (ref 35.0–45.0)
pH, Arterial: 7.292 — ABNORMAL LOW (ref 7.350–7.450)
pO2, Arterial: 85.3 mmHg (ref 80.0–100.0)

## 2016-01-06 LAB — BASIC METABOLIC PANEL
Anion gap: 4 — ABNORMAL LOW (ref 5–15)
BUN: 14 mg/dL (ref 6–20)
CO2: 24 mmol/L (ref 22–32)
Calcium: 7.9 mg/dL — ABNORMAL LOW (ref 8.9–10.3)
Chloride: 113 mmol/L — ABNORMAL HIGH (ref 101–111)
Creatinine, Ser: 1.67 mg/dL — ABNORMAL HIGH (ref 0.61–1.24)
GFR calc Af Amer: >60 mL/min (ref >60)
GFR calc non Af Amer: 57 mL/min — ABNORMAL LOW (ref >60)
Glucose, Bld: 99 mg/dL (ref 65–99)
Potassium: 4.8 mmol/L (ref 3.5–5.1)
Sodium: 141 mmol/L (ref 135–145)

## 2016-01-06 LAB — PREPARE FRESH FROZEN PLASMA
Unit division: 0
Unit division: 0

## 2016-01-06 LAB — MAGNESIUM
Magnesium: 2 mg/dL (ref 1.7–2.4)
Magnesium: 1.9 mg/dL (ref 1.7–2.4)

## 2016-01-06 LAB — GLUCOSE, CAPILLARY
Glucose-Capillary: 87 mg/dL (ref 65–99)
Glucose-Capillary: 97 mg/dL (ref 65–99)
Glucose-Capillary: 98 mg/dL (ref 65–99)

## 2016-01-06 LAB — CBC
HCT: 27.7 % — ABNORMAL LOW (ref 39.0–52.0)
Hemoglobin: 8.9 g/dL — ABNORMAL LOW (ref 13.0–17.0)
MCH: 28.8 pg (ref 26.0–34.0)
MCHC: 32.1 g/dL (ref 30.0–36.0)
MCV: 89.6 fL (ref 78.0–100.0)
Platelets: 84 10*3/uL — ABNORMAL LOW (ref 150–400)
RBC: 3.09 MIL/uL — ABNORMAL LOW (ref 4.22–5.81)
RDW: 15.1 % (ref 11.5–15.5)
WBC: 12.1 10*3/uL — ABNORMAL HIGH (ref 4.0–10.5)

## 2016-01-06 LAB — PROTIME-INR
INR: 1.6 — ABNORMAL HIGH (ref 0.00–1.49)
Prothrombin Time: 19.1 seconds — ABNORMAL HIGH (ref 11.6–15.2)

## 2016-01-06 LAB — PHOSPHORUS
Phosphorus: 2.9 mg/dL (ref 2.5–4.6)
Phosphorus: 2.2 mg/dL — ABNORMAL LOW (ref 2.5–4.6)

## 2016-01-06 LAB — APTT: aPTT: 35 seconds (ref 24–37)

## 2016-01-06 MED ORDER — ENOXAPARIN SODIUM 30 MG/0.3ML ~~LOC~~ SOLN
30.0000 mg | Freq: Two times a day (BID) | SUBCUTANEOUS | Status: DC
  Administered 2016-01-06 – 2016-01-07 (×3): 30 mg via SUBCUTANEOUS
  Filled 2016-01-06 (×3): qty 0.3

## 2016-01-06 MED ORDER — SELENIUM 50 MCG PO TABS
200.0000 ug | ORAL_TABLET | Freq: Every day | ORAL | Status: AC
  Administered 2016-01-06 – 2016-01-12 (×7): 200 ug
  Filled 2016-01-06 (×7): qty 4

## 2016-01-06 MED ORDER — PRO-STAT SUGAR FREE PO LIQD
30.0000 mL | Freq: Two times a day (BID) | ORAL | Status: DC
  Administered 2016-01-06 – 2016-01-07 (×3): 30 mL
  Filled 2016-01-06 (×3): qty 30

## 2016-01-06 MED ORDER — PIVOT 1.5 CAL PO LIQD
1000.0000 mL | ORAL | Status: DC
  Administered 2016-01-06: 1000 mL
  Filled 2016-01-06: qty 1000

## 2016-01-06 MED ORDER — VITAMIN E 15 UNIT/0.3ML PO SOLN
400.0000 [IU] | Freq: Three times a day (TID) | ORAL | Status: AC
  Administered 2016-01-06 – 2016-01-08 (×8): 400 [IU]
  Filled 2016-01-06 (×11): qty 8

## 2016-01-06 MED ORDER — VITAMIN C 500 MG PO TABS
1000.0000 mg | ORAL_TABLET | Freq: Three times a day (TID) | ORAL | Status: DC
  Administered 2016-01-06 – 2016-01-12 (×16): 1000 mg
  Filled 2016-01-06 (×15): qty 2
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
## 2016-01-07 ENCOUNTER — Inpatient Hospital Stay (HOSPITAL_COMMUNITY): Payer: Medicaid Other

## 2016-01-07 ENCOUNTER — Encounter (HOSPITAL_COMMUNITY): Payer: Self-pay | Admitting: General Surgery

## 2016-01-07 LAB — GLUCOSE, CAPILLARY
Glucose-Capillary: 90 mg/dL (ref 65–99)
Glucose-Capillary: 96 mg/dL (ref 65–99)
Glucose-Capillary: 110 mg/dL — ABNORMAL HIGH (ref 65–99)
Glucose-Capillary: 107 mg/dL — ABNORMAL HIGH (ref 65–99)
Glucose-Capillary: 99 mg/dL (ref 65–99)
Glucose-Capillary: 102 mg/dL — ABNORMAL HIGH (ref 65–99)
Glucose-Capillary: 79 mg/dL (ref 65–99)

## 2016-01-07 LAB — BASIC METABOLIC PANEL
Anion gap: 2 — ABNORMAL LOW (ref 5–15)
BUN: 17 mg/dL (ref 6–20)
CO2: 22 mmol/L (ref 22–32)
Calcium: 7.6 mg/dL — ABNORMAL LOW (ref 8.9–10.3)
Chloride: 116 mmol/L — ABNORMAL HIGH (ref 101–111)
Creatinine, Ser: 1.55 mg/dL — ABNORMAL HIGH (ref 0.61–1.24)
GFR calc Af Amer: >60 mL/min (ref >60)
GFR calc non Af Amer: >60 mL/min (ref >60)
Glucose, Bld: 98 mg/dL (ref 65–99)
Potassium: 4.1 mmol/L (ref 3.5–5.1)
Sodium: 140 mmol/L (ref 135–145)

## 2016-01-07 LAB — BLOOD GAS, ARTERIAL
Acid-base deficit: 1.8 mmol/L (ref 0.0–2.0)
Bicarbonate: 22.9 mEq/L (ref 20.0–24.0)
Drawn by: 44138
FIO2: 60
MECHVT: 600 mL
O2 Saturation: 97.3 %
PEEP: 5 cmH2O
Patient temperature: 100
RATE: 15 resp/min
TCO2: 24.2 mmol/L (ref 0–100)
pCO2 arterial: 44.1 mmHg (ref 35.0–45.0)
pH, Arterial: 7.34 — ABNORMAL LOW (ref 7.350–7.450)
pO2, Arterial: 96.7 mmHg (ref 80.0–100.0)

## 2016-01-07 LAB — CBC
HCT: 23.3 % — ABNORMAL LOW (ref 39.0–52.0)
Hemoglobin: 7.7 g/dL — ABNORMAL LOW (ref 13.0–17.0)
MCH: 29.3 pg (ref 26.0–34.0)
MCHC: 33 g/dL (ref 30.0–36.0)
MCV: 88.6 fL (ref 78.0–100.0)
Platelets: 74 10*3/uL — ABNORMAL LOW (ref 150–400)
RBC: 2.63 MIL/uL — ABNORMAL LOW (ref 4.22–5.81)
RDW: 14.7 % (ref 11.5–15.5)
WBC: 11.2 10*3/uL — ABNORMAL HIGH (ref 4.0–10.5)

## 2016-01-07 LAB — PHOSPHORUS
Phosphorus: 2 mg/dL — ABNORMAL LOW (ref 2.5–4.6)
Phosphorus: 1.6 mg/dL — ABNORMAL LOW (ref 2.5–4.6)

## 2016-01-07 LAB — MAGNESIUM
Magnesium: 2.1 mg/dL (ref 1.7–2.4)
Magnesium: 2.2 mg/dL (ref 1.7–2.4)

## 2016-01-07 LAB — TRIGLYCERIDES: Triglycerides: 557 mg/dL — ABNORMAL HIGH (ref <150)

## 2016-01-07 MED ORDER — MIDAZOLAM HCL 2 MG/2ML IJ SOLN
INTRAMUSCULAR | Status: AC
  Administered 2016-01-07: 2 mg via INTRAVENOUS
  Filled 2016-01-07: qty 2

## 2016-01-07 MED ORDER — MIDAZOLAM HCL 2 MG/2ML IJ SOLN
2.0000 mg | INTRAMUSCULAR | Status: DC | PRN
  Administered 2016-01-07 – 2016-01-12 (×24): 2 mg via INTRAVENOUS
  Filled 2016-01-07 (×25): qty 2

## 2016-01-07 MED ORDER — PRO-STAT SUGAR FREE PO LIQD
30.0000 mL | Freq: Every day | ORAL | Status: DC
  Administered 2016-01-07 – 2016-01-17 (×42): 30 mL
  Filled 2016-01-07 (×36): qty 30

## 2016-01-07 MED ORDER — PIVOT 1.5 CAL PO LIQD
1000.0000 mL | ORAL | Status: DC
  Administered 2016-01-07 – 2016-01-23 (×13): 1000 mL
  Filled 2016-01-07 (×5): qty 1000

## 2016-01-07 MED ORDER — MIDAZOLAM BOLUS VIA INFUSION
2.0000 mg | INTRAVENOUS | Status: DC | PRN
  Filled 2016-01-07: qty 2
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
## 2016-01-07 NOTE — Progress Notes (Signed)
Pt agitated, RAAS 1 to 2, coming out of bed. Continuous sedation maxed. Notified Trauma MD. Orders given for PRN versed. Will administer and continue to monitor.
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
## 2016-01-07 NOTE — Progress Notes (Signed)
RT note-ETT found to be dislodged to 18cm with Dr. Lindie SpruceWyatt, loosing 11600ml/min. ETT advanced and secured to 24 cm, good bilateral breath sounds. Continue to monitor.

## 2016-01-08 ENCOUNTER — Inpatient Hospital Stay (HOSPITAL_COMMUNITY): Payer: Medicaid Other | Admitting: Anesthesiology

## 2016-01-08 ENCOUNTER — Inpatient Hospital Stay (HOSPITAL_COMMUNITY): Payer: Medicaid Other

## 2016-01-08 LAB — GLUCOSE, CAPILLARY
Glucose-Capillary: 101 mg/dL — ABNORMAL HIGH (ref 65–99)
Glucose-Capillary: 101 mg/dL — ABNORMAL HIGH (ref 65–99)
Glucose-Capillary: 102 mg/dL — ABNORMAL HIGH (ref 65–99)
Glucose-Capillary: 100 mg/dL — ABNORMAL HIGH (ref 65–99)
Glucose-Capillary: 109 mg/dL — ABNORMAL HIGH (ref 65–99)

## 2016-01-08 LAB — BLOOD GAS, ARTERIAL
Acid-base deficit: 0 mmol/L (ref 0.0–2.0)
Acid-base deficit: 0.5 mmol/L (ref 0.0–2.0)
Bicarbonate: 25.3 mEq/L — ABNORMAL HIGH (ref 20.0–24.0)
Bicarbonate: 24.5 mEq/L — ABNORMAL HIGH (ref 20.0–24.0)
Drawn by: 44135
FIO2: 0.55
FIO2: 1
MECHVT: 600 mL
O2 Saturation: 91 %
O2 Saturation: 99.1 %
PEEP: 8 cmH2O
Patient temperature: 99.1
Patient temperature: 98.6
RATE: 18 resp/min
TCO2: 26.8 mmol/L (ref 0–100)
TCO2: 26 mmol/L (ref 0–100)
pCO2 arterial: 51 mmHg — ABNORMAL HIGH (ref 35.0–45.0)
pCO2 arterial: 46.8 mmHg — ABNORMAL HIGH (ref 35.0–45.0)
pH, Arterial: 7.318 — ABNORMAL LOW (ref 7.350–7.450)
pH, Arterial: 7.339 — ABNORMAL LOW (ref 7.350–7.450)
pO2, Arterial: 62.4 mmHg — ABNORMAL LOW (ref 80.0–100.0)
pO2, Arterial: 153 mmHg — ABNORMAL HIGH (ref 80.0–100.0)

## 2016-01-08 LAB — COMPREHENSIVE METABOLIC PANEL
ALT: 646 U/L — ABNORMAL HIGH (ref 17–63)
AST: 206 U/L — ABNORMAL HIGH (ref 15–41)
Albumin: 1.8 g/dL — ABNORMAL LOW (ref 3.5–5.0)
Alkaline Phosphatase: 84 U/L (ref 38–126)
Anion gap: 4 — ABNORMAL LOW (ref 5–15)
BUN: 15 mg/dL (ref 6–20)
CO2: 23 mmol/L (ref 22–32)
Calcium: 8 mg/dL — ABNORMAL LOW (ref 8.9–10.3)
Chloride: 117 mmol/L — ABNORMAL HIGH (ref 101–111)
Creatinine, Ser: 1.49 mg/dL — ABNORMAL HIGH (ref 0.61–1.24)
GFR calc Af Amer: >60 mL/min (ref >60)
GFR calc non Af Amer: >60 mL/min (ref >60)
Glucose, Bld: 106 mg/dL — ABNORMAL HIGH (ref 65–99)
Potassium: 3.8 mmol/L (ref 3.5–5.1)
Sodium: 144 mmol/L (ref 135–145)
Total Bilirubin: 1.3 mg/dL — ABNORMAL HIGH (ref 0.3–1.2)
Total Protein: 4.3 g/dL — ABNORMAL LOW (ref 6.5–8.1)

## 2016-01-08 LAB — CBC
HCT: 22.4 % — ABNORMAL LOW (ref 39.0–52.0)
Hemoglobin: 7.3 g/dL — ABNORMAL LOW (ref 13.0–17.0)
MCH: 29.4 pg (ref 26.0–34.0)
MCHC: 32.6 g/dL (ref 30.0–36.0)
MCV: 90.3 fL (ref 78.0–100.0)
Platelets: 85 10*3/uL — ABNORMAL LOW (ref 150–400)
RBC: 2.48 MIL/uL — ABNORMAL LOW (ref 4.22–5.81)
RDW: 14.8 % (ref 11.5–15.5)
WBC: 10.1 10*3/uL (ref 4.0–10.5)

## 2016-01-08 LAB — TRIGLYCERIDES: Triglycerides: 664 mg/dL — ABNORMAL HIGH (ref <150)

## 2016-01-08 MED ORDER — FUROSEMIDE 10 MG/ML IJ SOLN
40.0000 mg | Freq: Once | INTRAMUSCULAR | Status: AC
  Administered 2016-01-08: 40 mg via INTRAVENOUS
  Filled 2016-01-08: qty 4

## 2016-01-08 MED ORDER — CHLORHEXIDINE GLUCONATE 0.12% ORAL RINSE (MEDLINE KIT)
15.0000 mL | Freq: Two times a day (BID) | OROMUCOSAL | Status: DC
  Administered 2016-01-08 – 2016-01-24 (×31): 15 mL via OROMUCOSAL

## 2016-01-08 MED ORDER — LIDOCAINE HCL (CARDIAC) 20 MG/ML IV SOLN
INTRAVENOUS | Status: DC | PRN
  Administered 2016-01-08: 100 mg via INTRAVENOUS

## 2016-01-08 MED ORDER — HALOPERIDOL LACTATE 5 MG/ML IJ SOLN
INTRAMUSCULAR | Status: AC
  Filled 2016-01-08: qty 1

## 2016-01-08 MED ORDER — SUCCINYLCHOLINE CHLORIDE 20 MG/ML IJ SOLN
INTRAMUSCULAR | Status: DC | PRN
  Administered 2016-01-08: 100 mg via INTRAVENOUS

## 2016-01-08 MED ORDER — ONDANSETRON HCL 4 MG/2ML IJ SOLN
INTRAMUSCULAR | Status: AC
  Administered 2016-01-08: 4 mg
  Filled 2016-01-08: qty 2

## 2016-01-08 MED ORDER — DEXMEDETOMIDINE HCL IN NACL 400 MCG/100ML IV SOLN
0.0000 ug/kg/h | INTRAVENOUS | Status: DC
  Administered 2016-01-08: 2 ug/kg/h via INTRAVENOUS
  Administered 2016-01-08: 1 ug/kg/h via INTRAVENOUS
  Administered 2016-01-08: 2 ug/kg/h via INTRAVENOUS
  Administered 2016-01-08: 0.4 ug/kg/h via INTRAVENOUS
  Filled 2016-01-08 (×2): qty 50
  Filled 2016-01-08 (×5): qty 100

## 2016-01-08 MED ORDER — HALOPERIDOL LACTATE 5 MG/ML IJ SOLN
5.0000 mg | Freq: Four times a day (QID) | INTRAMUSCULAR | Status: DC | PRN
  Administered 2016-01-08 (×2): 5 mg via INTRAVENOUS
  Filled 2016-01-08: qty 2

## 2016-01-08 MED ORDER — MORPHINE SULFATE (PF) 2 MG/ML IV SOLN
1.0000 mg | INTRAVENOUS | Status: DC | PRN
  Administered 2016-01-08 (×2): 4 mg via INTRAVENOUS
  Filled 2016-01-08 (×2): qty 2

## 2016-01-08 MED ORDER — ANTISEPTIC ORAL RINSE SOLUTION (CORINZ)
7.0000 mL | OROMUCOSAL | Status: DC
  Administered 2016-01-08 – 2016-01-24 (×161): 7 mL via OROMUCOSAL

## 2016-01-08 MED ORDER — PROPOFOL 10 MG/ML IV BOLUS
INTRAVENOUS | Status: DC | PRN
  Administered 2016-01-08: 100 mg via INTRAVENOUS

## 2016-01-08 NOTE — Anesthesia Preprocedure Evaluation (Addendum)
Anesthesia Evaluation  Patient identified by MRN, date of birth, ID band Patient unresponsive    Reviewed: Allergy & Precautions, Unable to perform ROS - Chart review onlyPreop documentation limited or incomplete due to emergent nature of procedure.  History of Anesthesia Complications Negative for: history of anesthetic complications  Airway Mallampati: Intubated       Dental   Pulmonary    + rhonchi        Cardiovascular  Rhythm:Regular     Neuro/Psych    GI/Hepatic   Endo/Other    Renal/GU Renal disease     Musculoskeletal   Abdominal   Peds  Hematology   Anesthesia Other Findings   Reproductive/Obstetrics                             Anesthesia Physical  Anesthesia Plan  ASA: IV and emergent  Anesthesia Plan: General   Post-op Pain Management:    Induction: Intravenous, Rapid sequence and Cricoid pressure planned  Airway Management Planned: Oral ETT  Additional Equipment:   Intra-op Plan:   Post-operative Plan: Post-operative intubation/ventilation  Informed Consent:   Only emergency history available and History available from chart only  Plan Discussed with: CRNA, Surgeon and Anesthesiologist  Anesthesia Plan Comments: (Called by Dr. Magnus IvanBlackman.  Pt extubated today, but will need to be re-intubated now.)      Anesthesia Quick Evaluation

## 2016-01-08 NOTE — Progress Notes (Signed)
Patient ID: Samuel PaganShiquan Owens, male   DOB: 05/12/1996, 20 y.o.   MRN: [id number]   Patient required reintubation.  Aspiration occurred during procedure.  Follow-up CXR shows tube in good position. Will repeat xray in the morning.  Patient sedated.

## 2016-01-08 NOTE — Procedures (Signed)
Extubation Procedure Note  Patient Details:   Name: Samuel Owens DOB: 1996/04/01 MRN: [id number]   Airway Documentation:     Evaluation  O2 sats: transiently fell during during procedure and currently acceptable Complications: Complications of excessive agitation, desaturation after extubation Patient did not tolerate procedure well. Bilateral Breath Sounds: Diminished, Clear   Yes   After extubation, pt placed on 5LNC. Pt was extremely agitated, desaturation into the 70's, pt was then placed on 55%VM and NT suctioned. Pt calm at this time with O2 sat in 90's.  RT Will continue to monitor.   Armando GangMike, Anner Baity C 01/08/2016, 3:18 PM

## 2016-01-08 NOTE — Progress Notes (Signed)
Upon turning, Pt got very agitated. ETT displaced to 18 @ lip. Trauma MD Dwain SarnaWakefield notified. Orders given to advance ETT back to prior positioning and obtain chest xray.   Chest xray results discussed with MD Dwain SarnaWakefield. Orders given to advance the ETT tube 2cm. Will continue to monitor.

## 2016-01-08 NOTE — Progress Notes (Signed)
Pt on precedex, weaning and pulling good volumes.  Sedation medication turned down.  Paged trauma MD.  OK to extubate pt.  Post extubation, pt really agitated and desat into 70-80's.  Pt placed on venti mask @ 55%.  Pt in 4 point restraints due to trying to kick staff and being very active and agitated.  Pt did not tolerate procedure well.  Precedex max out at 2mg /hr.  5mg  of Haldol also given.  Pt slightly better at this time.  Will continue to monitor.

## 2016-01-08 NOTE — Progress Notes (Signed)
See notes

## 2016-01-08 NOTE — Progress Notes (Signed)
When assessing patient at 2050, pt extremely agitated, combative, restless, thrashing around in bed. Pt began desating on venti mask. NRB placed. Sats maintaining mid 80s. RR 44, increase WOB, despite prior medications, pt still agitated/combative with restraints present. Trauma MD notified. Order given to intubate.    2100: Anesthesia at bedside intubating. As patient was laid flat, copious amounts of vomit poured out of mouth and nose. Pt suctioned. Trauma MD notified. Chest xray and NGT ordered.   Family aware and updated. Brought to beside. Will continue to monitor.

## 2016-01-08 NOTE — Anesthesia Procedure Notes (Signed)
Procedure Name: Intubation Performed by: Samuel RobertsSINGER, Samuel Owens Pre-anesthesia Checklist: Patient identified, Emergency Drugs available, Suction available, Patient being monitored and Timeout performed Patient Re-evaluated:Patient Re-evaluated prior to inductionOxygen Delivery Method: Ambu bag Preoxygenation: Pre-oxygenation with 100% oxygen Intubation Type: IV induction, Rapid sequence and Cricoid Pressure applied Ventilation: Mask ventilation without difficulty Laryngoscope Size: Mac and 4 Grade View: Grade II Tube type: Subglottic suction tube Tube size: 8.0 mm Airway Equipment and Method: Stylet Placement Confirmation: ETT inserted through vocal cords under direct vision,  CO2 detector and breath sounds checked- equal and bilateral Secured at: 22 cm Tube secured with: Tape Dental Injury: Teeth and Oropharynx as per pre-operative assessment  Comments: As patient was induced and ventilated, copious tube feeding poured out of his mouth and nose.  He was suctioned, but there was definite evidence of aspiration of gastric content despite discontinuing of OG tube feeds at 2pm (off for 6 hours).  Dr. Gayla DossBalckman notified.
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
## 2016-01-09 ENCOUNTER — Inpatient Hospital Stay (HOSPITAL_COMMUNITY): Payer: Medicaid Other

## 2016-01-09 LAB — GLUCOSE, CAPILLARY
Glucose-Capillary: 84 mg/dL (ref 65–99)
Glucose-Capillary: 86 mg/dL (ref 65–99)
Glucose-Capillary: 98 mg/dL (ref 65–99)
Glucose-Capillary: 105 mg/dL — ABNORMAL HIGH (ref 65–99)
Glucose-Capillary: 113 mg/dL — ABNORMAL HIGH (ref 65–99)
Glucose-Capillary: 118 mg/dL — ABNORMAL HIGH (ref 65–99)
Glucose-Capillary: 108 mg/dL — ABNORMAL HIGH (ref 65–99)

## 2016-01-09 LAB — POCT I-STAT 3, ART BLOOD GAS (G3+)
Acid-base deficit: 14 mmol/L — ABNORMAL HIGH (ref 0.0–2.0)
Acid-base deficit: 4 mmol/L — ABNORMAL HIGH (ref 0.0–2.0)
Bicarbonate: 13.4 mEq/L — ABNORMAL LOW (ref 20.0–24.0)
Bicarbonate: 24.3 mEq/L — ABNORMAL HIGH (ref 20.0–24.0)
O2 Saturation: 77 %
O2 Saturation: 96 %
Patient temperature: 98.4
Patient temperature: 98.4
TCO2: 15 mmol/L (ref 0–100)
TCO2: 26 mmol/L (ref 0–100)
pCO2 arterial: 37 mmHg (ref 35.0–45.0)
pCO2 arterial: 62.4 mmHg (ref 35.0–45.0)
pH, Arterial: 7.167 — CL (ref 7.350–7.450)
pH, Arterial: 7.198 — CL (ref 7.350–7.450)
pO2, Arterial: 52 mmHg — ABNORMAL LOW (ref 80.0–100.0)
pO2, Arterial: 104 mmHg — ABNORMAL HIGH (ref 80.0–100.0)

## 2016-01-09 LAB — CBC WITH DIFFERENTIAL/PLATELET
Basophils Absolute: 0 10*3/uL (ref 0.0–0.1)
Basophils Relative: 0 %
Eosinophils Absolute: 0 10*3/uL (ref 0.0–0.7)
Eosinophils Relative: 0 %
HCT: 25.8 % — ABNORMAL LOW (ref 39.0–52.0)
Hemoglobin: 8.2 g/dL — ABNORMAL LOW (ref 13.0–17.0)
Lymphocytes Relative: 6 %
Lymphs Abs: 0.9 10*3/uL (ref 0.7–4.0)
MCH: 28.9 pg (ref 26.0–34.0)
MCHC: 31.8 g/dL (ref 30.0–36.0)
MCV: 90.8 fL (ref 78.0–100.0)
Monocytes Absolute: 1.1 10*3/uL — ABNORMAL HIGH (ref 0.1–1.0)
Monocytes Relative: 8 %
Neutro Abs: 12.3 10*3/uL — ABNORMAL HIGH (ref 1.7–7.7)
Neutrophils Relative %: 86 %
Platelets: 89 10*3/uL — ABNORMAL LOW (ref 150–400)
RBC: 2.84 MIL/uL — ABNORMAL LOW (ref 4.22–5.81)
RDW: 14.7 % (ref 11.5–15.5)
WBC: 14.3 10*3/uL — ABNORMAL HIGH (ref 4.0–10.5)

## 2016-01-09 LAB — BASIC METABOLIC PANEL
Anion gap: 6 (ref 5–15)
BUN: 22 mg/dL — ABNORMAL HIGH (ref 6–20)
CO2: 23 mmol/L (ref 22–32)
Calcium: 7.6 mg/dL — ABNORMAL LOW (ref 8.9–10.3)
Chloride: 115 mmol/L — ABNORMAL HIGH (ref 101–111)
Creatinine, Ser: 2.02 mg/dL — ABNORMAL HIGH (ref 0.61–1.24)
GFR calc Af Amer: 53 mL/min — ABNORMAL LOW (ref >60)
GFR calc non Af Amer: 45 mL/min — ABNORMAL LOW (ref >60)
Glucose, Bld: 97 mg/dL (ref 65–99)
Potassium: 4.9 mmol/L (ref 3.5–5.1)
Sodium: 144 mmol/L (ref 135–145)

## 2016-01-09 LAB — BLOOD GAS, ARTERIAL
Acid-base deficit: 2.6 mmol/L — ABNORMAL HIGH (ref 0.0–2.0)
Bicarbonate: 23.9 mEq/L (ref 20.0–24.0)
Drawn by: 331761
FIO2: 0.7
MECHVT: 470 mL
O2 Saturation: 97.9 %
PEEP: 12 cmH2O
Patient temperature: 97.4
RATE: 30 resp/min
TCO2: 25.7 mmol/L (ref 0–100)
pCO2 arterial: 56.8 mmHg — ABNORMAL HIGH (ref 35.0–45.0)
pH, Arterial: 7.242 — ABNORMAL LOW (ref 7.350–7.450)
pO2, Arterial: 114 mmHg — ABNORMAL HIGH (ref 80.0–100.0)

## 2016-01-09 MED ORDER — PIPERACILLIN-TAZOBACTAM 3.375 G IVPB
3.3750 g | Freq: Three times a day (TID) | INTRAVENOUS | Status: DC
  Administered 2016-01-09 – 2016-01-14 (×16): 3.375 g via INTRAVENOUS
  Filled 2016-01-09 (×17): qty 50

## 2016-01-09 MED ORDER — VANCOMYCIN HCL 10 G IV SOLR: 1500.0000 mg | Freq: Two times a day (BID) | INTRAVENOUS | Status: DC

## 2016-01-09 MED ORDER — DEXTROSE-NACL 5-0.45 % IV SOLN
INTRAVENOUS | Status: DC
  Administered 2016-01-09 – 2016-01-11 (×4): via INTRAVENOUS
  Administered 2016-01-12: 1000 mL via INTRAVENOUS
  Administered 2016-01-12 – 2016-01-16 (×8): via INTRAVENOUS

## 2016-01-09 MED ORDER — PANTOPRAZOLE SODIUM 40 MG PO PACK
40.0000 mg | PACK | Freq: Two times a day (BID) | ORAL | Status: DC
  Administered 2016-01-10 – 2016-01-23 (×26): 40 mg
  Filled 2016-01-09 (×26): qty 20

## 2016-01-09 MED ORDER — PANTOPRAZOLE SODIUM 40 MG PO PACK: 40.0000 mg | PACK | Freq: Two times a day (BID) | ORAL | Status: DC

## 2016-01-09 MED ORDER — VITAMIN E 100 UNT/0.25ML PO OIL
400.0000 [IU] | TOPICAL_OIL | Freq: Three times a day (TID) | ORAL | Status: DC
  Administered 2016-01-10 – 2016-01-12 (×8): 400 [IU] via ORAL
  Filled 2016-01-09 (×10): qty 1

## 2016-01-09 MED ORDER — VANCOMYCIN HCL 10 G IV SOLR
1500.0000 mg | Freq: Once | INTRAVENOUS | Status: AC
  Administered 2016-01-09: 1500 mg via INTRAVENOUS
  Filled 2016-01-09: qty 1500

## 2016-01-09 MED ORDER — FAMOTIDINE IN NACL 20-0.9 MG/50ML-% IV SOLN
20.0000 mg | Freq: Two times a day (BID) | INTRAVENOUS | Status: DC
  Administered 2016-01-09 – 2016-01-10 (×3): 20 mg via INTRAVENOUS
  Filled 2016-01-09 (×3): qty 50

## 2016-01-09 MED ORDER — PIPERACILLIN-TAZOBACTAM 4.5 G IVPB: 4.5000 g | Freq: Four times a day (QID) | INTRAVENOUS | Status: DC

## 2016-01-09 MED ORDER — VANCOMYCIN HCL IN DEXTROSE 750-5 MG/150ML-% IV SOLN
750.0000 mg | Freq: Two times a day (BID) | INTRAVENOUS | Status: DC
  Filled 2016-01-09: qty 150
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
## 2016-01-09 NOTE — Progress Notes (Signed)
Patient was desatting into low 80s and breathing around the ETT tube. RT checked cuff and pressure was 28. ETT was at 21cm at the lip. Patient was not achieving good VT. RT advanced ETT to 24cm at the lip (patient had been previously intubated with tube at 24cm at the lip).  Patient has equal bilateral breath sounds and is getting good volumes with peak inspiratory pressure of 33cm h20. Patient's O2 sat is 98% and patient is resting comfortably. RT will continue to monitor.

## 2016-01-09 NOTE — Progress Notes (Signed)
Initiated ARDS protocol per MD. Started on 7ccs at this time. PRVC 470,26,70%, +12. ABG will follow. RT will cont to monitor pt.
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
## 2016-01-09 NOTE — Progress Notes (Deleted)
D/c-ed vancomycin due to renal failure and increased SCr. Low risk for MRSA PNA, as his MRSA PCR screen was negative. IVF increased.  Hosie SpangleElizabeth Simaan, PA-C [facility] Pager: [phone] Mon-Fri 7:00 am-4:30 pm Sat-Sun 7:00 am-11:30 am

## 2016-01-10 ENCOUNTER — Inpatient Hospital Stay (HOSPITAL_COMMUNITY): Payer: Medicaid Other

## 2016-01-10 LAB — BASIC METABOLIC PANEL
Anion gap: 5 (ref 5–15)
BUN: 33 mg/dL — ABNORMAL HIGH (ref 6–20)
CO2: 24 mmol/L (ref 22–32)
Calcium: 7.6 mg/dL — ABNORMAL LOW (ref 8.9–10.3)
Chloride: 114 mmol/L — ABNORMAL HIGH (ref 101–111)
Creatinine, Ser: 2.91 mg/dL — ABNORMAL HIGH (ref 0.61–1.24)
GFR calc Af Amer: 34 mL/min — ABNORMAL LOW (ref >60)
GFR calc non Af Amer: 29 mL/min — ABNORMAL LOW (ref >60)
Glucose, Bld: 119 mg/dL — ABNORMAL HIGH (ref 65–99)
Potassium: 5.4 mmol/L — ABNORMAL HIGH (ref 3.5–5.1)
Sodium: 143 mmol/L (ref 135–145)

## 2016-01-10 LAB — BLOOD GAS, ARTERIAL
Acid-base deficit: 2.9 mmol/L — ABNORMAL HIGH (ref 0.0–2.0)
Acid-base deficit: 2.4 mmol/L — ABNORMAL HIGH (ref 0.0–2.0)
Bicarbonate: 24.4 mEq/L — ABNORMAL HIGH (ref 20.0–24.0)
Bicarbonate: 24.1 mEq/L — ABNORMAL HIGH (ref 20.0–24.0)
Drawn by: 270161
Drawn by: 232811
FIO2: 0.6
FIO2: 0.6
MECHVT: 0.6 mL
MECHVT: 470 mL
O2 Saturation: 86.5 %
O2 Saturation: 94.9 %
PEEP: 8 cmH2O
PEEP: 10 cmH2O
Patient temperature: 98.6
Patient temperature: 99.7
RATE: 18 resp/min
RATE: 35 resp/min
TCO2: 26.4 mmol/L (ref 0–100)
TCO2: 25.9 mmol/L (ref 0–100)
pCO2 arterial: 67.3 mmHg (ref 35.0–45.0)
pCO2 arterial: 60.9 mmHg (ref 35.0–45.0)
pH, Arterial: 7.184 — CL (ref 7.350–7.450)
pH, Arterial: 7.225 — ABNORMAL LOW (ref 7.350–7.450)
pO2, Arterial: 60.6 mmHg — ABNORMAL LOW (ref 80.0–100.0)
pO2, Arterial: 85.5 mmHg (ref 80.0–100.0)

## 2016-01-10 LAB — CBC
HCT: 23 % — ABNORMAL LOW (ref 39.0–52.0)
Hemoglobin: 7.3 g/dL — ABNORMAL LOW (ref 13.0–17.0)
MCH: 28.7 pg (ref 26.0–34.0)
MCHC: 31.7 g/dL (ref 30.0–36.0)
MCV: 90.6 fL (ref 78.0–100.0)
Platelets: 106 10*3/uL — ABNORMAL LOW (ref 150–400)
RBC: 2.54 MIL/uL — ABNORMAL LOW (ref 4.22–5.81)
RDW: 15.3 % (ref 11.5–15.5)
WBC: 15.8 10*3/uL — ABNORMAL HIGH (ref 4.0–10.5)

## 2016-01-10 LAB — GLUCOSE, CAPILLARY
Glucose-Capillary: 110 mg/dL — ABNORMAL HIGH (ref 65–99)
Glucose-Capillary: 117 mg/dL — ABNORMAL HIGH (ref 65–99)
Glucose-Capillary: 114 mg/dL — ABNORMAL HIGH (ref 65–99)
Glucose-Capillary: 115 mg/dL — ABNORMAL HIGH (ref 65–99)
Glucose-Capillary: 111 mg/dL — ABNORMAL HIGH (ref 65–99)

## 2016-01-10 LAB — TRIGLYCERIDES: Triglycerides: 632 mg/dL — ABNORMAL HIGH (ref <150)

## 2016-01-10 MED ORDER — DEXMEDETOMIDINE HCL IN NACL 400 MCG/100ML IV SOLN
0.0000 ug/kg/h | INTRAVENOUS | Status: DC
  Administered 2016-01-10: 1.2 ug/kg/h via INTRAVENOUS
  Administered 2016-01-10: 1 ug/kg/h via INTRAVENOUS
  Administered 2016-01-10 – 2016-01-12 (×8): 1.2 ug/kg/h via INTRAVENOUS
  Administered 2016-01-12: 1.199 ug/kg/h via INTRAVENOUS
  Administered 2016-01-12 – 2016-01-13 (×6): 1.2 ug/kg/h via INTRAVENOUS
  Filled 2016-01-10 (×14): qty 100
  Filled 2016-01-10: qty 50
  Filled 2016-01-10 (×5): qty 100

## 2016-01-10 NOTE — Progress Notes (Signed)
Called to patients room due to constant cuff leak. Patients ETT was advanced back to 25 at the lip with no complications, nurse is aware.

## 2016-01-10 NOTE — Progress Notes (Signed)
Pt remains sedated and on ventilator with ARDS protocol.  Will continue to follow progress.    Quintella BatonJulie W. Maraki Macquarrie, RN, BSN  Trauma/Neuro ICU Case Manager [phone]
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
## 2016-01-11 ENCOUNTER — Inpatient Hospital Stay (HOSPITAL_COMMUNITY): Payer: Medicaid Other

## 2016-01-11 LAB — PREPARE RBC (CROSSMATCH)

## 2016-01-11 LAB — TYPE AND SCREEN
ABO/RH(D): AB POS
Antibody Screen: NEGATIVE
PT AG Type: NEGATIVE
Unit division: 0
Unit division: 0
Unit division: 0
Unit division: 0
Unit division: 0
Unit division: 0
Unit division: 0
Unit division: 0

## 2016-01-11 LAB — BASIC METABOLIC PANEL
Anion gap: 3 — ABNORMAL LOW (ref 5–15)
BUN: 33 mg/dL — ABNORMAL HIGH (ref 6–20)
CO2: 23 mmol/L (ref 22–32)
Calcium: 7.1 mg/dL — ABNORMAL LOW (ref 8.9–10.3)
Chloride: 117 mmol/L — ABNORMAL HIGH (ref 101–111)
Creatinine, Ser: 2.79 mg/dL — ABNORMAL HIGH (ref 0.61–1.24)
GFR calc Af Amer: 36 mL/min — ABNORMAL LOW (ref >60)
GFR calc non Af Amer: 31 mL/min — ABNORMAL LOW (ref >60)
Glucose, Bld: 161 mg/dL — ABNORMAL HIGH (ref 65–99)
Potassium: 4.9 mmol/L (ref 3.5–5.1)
Sodium: 143 mmol/L (ref 135–145)

## 2016-01-11 LAB — BLOOD GAS, ARTERIAL
Acid-base deficit: 2.1 mmol/L — ABNORMAL HIGH (ref 0.0–2.0)
Bicarbonate: 23.6 mEq/L (ref 20.0–24.0)
Drawn by: 44135
FIO2: 0.4
MECHVT: 470 mL
O2 Saturation: 86.1 %
PEEP: 8 cmH2O
Patient temperature: 101.1
RATE: 35 resp/min
TCO2: 25.1 mmol/L (ref 0–100)
pCO2 arterial: 54.4 mmHg — ABNORMAL HIGH (ref 35.0–45.0)
pH, Arterial: 7.268 — ABNORMAL LOW (ref 7.350–7.450)
pO2, Arterial: 62.3 mmHg — ABNORMAL LOW (ref 80.0–100.0)

## 2016-01-11 LAB — CBC WITH DIFFERENTIAL/PLATELET
Basophils Absolute: 0 10*3/uL (ref 0.0–0.1)
Basophils Relative: 0 %
Eosinophils Absolute: 0.5 10*3/uL (ref 0.0–0.7)
Eosinophils Relative: 3 %
HCT: 20 % — ABNORMAL LOW (ref 39.0–52.0)
Hemoglobin: 6.4 g/dL — CL (ref 13.0–17.0)
Lymphocytes Relative: 8 %
Lymphs Abs: 1.2 10*3/uL (ref 0.7–4.0)
MCH: 28.7 pg (ref 26.0–34.0)
MCHC: 32 g/dL (ref 30.0–36.0)
MCV: 89.7 fL (ref 78.0–100.0)
Monocytes Absolute: 0.6 10*3/uL (ref 0.1–1.0)
Monocytes Relative: 4 %
Neutro Abs: 12.8 10*3/uL — ABNORMAL HIGH (ref 1.7–7.7)
Neutrophils Relative %: 85 %
Platelets: 121 10*3/uL — ABNORMAL LOW (ref 150–400)
RBC: 2.23 MIL/uL — ABNORMAL LOW (ref 4.22–5.81)
RDW: 15.1 % (ref 11.5–15.5)
WBC: 15.1 10*3/uL — ABNORMAL HIGH (ref 4.0–10.5)

## 2016-01-11 LAB — GLUCOSE, CAPILLARY
Glucose-Capillary: 110 mg/dL — ABNORMAL HIGH (ref 65–99)
Glucose-Capillary: 117 mg/dL — ABNORMAL HIGH (ref 65–99)
Glucose-Capillary: 118 mg/dL — ABNORMAL HIGH (ref 65–99)
Glucose-Capillary: 118 mg/dL — ABNORMAL HIGH (ref 65–99)
Glucose-Capillary: 124 mg/dL — ABNORMAL HIGH (ref 65–99)
Glucose-Capillary: 126 mg/dL — ABNORMAL HIGH (ref 65–99)

## 2016-01-11 LAB — CULTURE, RESPIRATORY
Culture: NORMAL
Special Requests: NORMAL

## 2016-01-11 LAB — PROTIME-INR
INR: 1.34 (ref 0.00–1.49)
Prothrombin Time: 16.7 seconds — ABNORMAL HIGH (ref 11.6–15.2)

## 2016-01-11 MED ORDER — QUETIAPINE FUMARATE 100 MG PO TABS
100.0000 mg | ORAL_TABLET | Freq: Three times a day (TID) | ORAL | Status: DC
  Administered 2016-01-11 – 2016-01-17 (×17): 100 mg
  Filled 2016-01-11 (×17): qty 1

## 2016-01-11 MED ORDER — SODIUM CHLORIDE 0.9% FLUSH
10.0000 mL | Freq: Two times a day (BID) | INTRAVENOUS | Status: DC
  Administered 2016-01-11 (×2): 10 mL
  Administered 2016-01-12: 20 mL
  Administered 2016-01-12 – 2016-01-15 (×7): 10 mL
  Administered 2016-01-16: 30 mL
  Administered 2016-01-16: 10 mL
  Administered 2016-01-17: 30 mL
  Administered 2016-01-17: 10 mL
  Administered 2016-01-18: 30 mL
  Administered 2016-01-19: 20 mL
  Administered 2016-01-19 – 2016-01-21 (×5): 10 mL
  Administered 2016-01-22: 7 mL
  Administered 2016-01-23: 30 mL
  Administered 2016-01-23 – 2016-01-24 (×2): 10 mL
  Administered 2016-01-24: 30 mL
  Administered 2016-01-25: 20 mL
  Administered 2016-01-25: 40 mL
  Administered 2016-01-26: 10 mL
  Administered 2016-01-26: 30 mL
  Administered 2016-01-27 – 2016-01-31 (×7): 10 mL

## 2016-01-11 MED ORDER — FENTANYL 50 MCG/HR TD PT72
50.0000 ug | MEDICATED_PATCH | TRANSDERMAL | Status: DC
  Administered 2016-01-11 – 2016-01-14 (×2): 50 ug via TRANSDERMAL
  Filled 2016-01-11 (×2): qty 1

## 2016-01-11 MED ORDER — SODIUM CHLORIDE 0.9% FLUSH: 10.0000 mL | INTRAVENOUS | Status: DC | PRN

## 2016-01-11 MED ORDER — BISACODYL 10 MG RE SUPP
10.0000 mg | Freq: Once | RECTAL | Status: AC
  Administered 2016-01-11: 10 mg via RECTAL
  Filled 2016-01-11: qty 1

## 2016-01-11 MED ORDER — SODIUM CHLORIDE 0.9 % IV SOLN: Freq: Once | INTRAVENOUS | Status: DC

## 2016-01-11 MED ORDER — DOCUSATE SODIUM 50 MG/5ML PO LIQD
100.0000 mg | Freq: Two times a day (BID) | ORAL | Status: DC
  Administered 2016-01-11 – 2016-01-23 (×18): 100 mg via ORAL
  Filled 2016-01-11 (×26): qty 10

## 2016-01-11 MED ORDER — DIAZEPAM 1 MG/ML PO SOLN
2.0000 mg | Freq: Four times a day (QID) | ORAL | Status: DC
  Administered 2016-01-11 – 2016-01-17 (×19): 2 mg
  Filled 2016-01-11 (×20): qty 5

## 2016-01-11 NOTE — Progress Notes (Signed)
CRITICAL VALUE ALERT  Critical value received: Hgb 6.4  Date of notification:  6/23  Time of notification:  0610  Critical value read back:Yes.    Nurse who received alert:  T. Davonna BellingHutton  MD notified (1st page): Dr. Andrey CampanileWilson  Time of first page:  [phone]  MD notified (2nd page):  Time of second page:  Responding MD:  Dr. Andrey CampanileWilson  Time MD responded: 620  ** Order received to transfuse 1 unit and draw INR.
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
## 2016-01-11 NOTE — Progress Notes (Signed)
Peripherally Inserted Central Catheter/Midline Placement  The IV Nurse has discussed with the patient and/or persons authorized to consent for the patient, the purpose of this procedure and the potential benefits and risks involved with this procedure.  The benefits include less needle sticks, lab draws from the catheter and patient may be discharged home with the catheter.  Risks include, but not limited to, infection, bleeding, blood clot (thrombus formation), and puncture of an artery; nerve damage and irregular heat beat.  Alternatives to this procedure were also discussed.  PICC/Midline Placement Documentation     Information given to mother   Timmothy Soursewman, Rachyl Wuebker Renee 01/11/2016, 2:22 PM

## 2016-01-11 NOTE — Progress Notes (Signed)
Dr Lindie SpruceWyatt stated it was ok to give blood with this temp

## 2016-01-11 NOTE — Progress Notes (Signed)
Follow up - Trauma and Critical Care  Patient Details:    Samuel Owens is an 20 y.o. male.  Lines/tubes : Airway 8 mm (Active)  Secured at (cm) 25 cm 01/11/2016  4:24 AM  Measured From Lips 01/11/2016  4:24 AM  Secured Location Center 01/11/2016  4:24 AM  Secured By [employer] 01/11/2016  4:24 AM  Tube Holder Repositioned Yes 01/11/2016  4:24 AM  Cuff Pressure (cm H2O) 24 cm H2O 01/10/2016  8:11 PM  Site Condition Dry 01/11/2016  4:24 AM     CVC Single Lumen 01/03/16 Right Internal jugular (Active)  Indication for Insertion or Continuance of Line Head or chest injuries (Tracheotomy, burns, open chest wounds) 01/11/2016  7:24 AM  Site Assessment Clean;Dry;Intact 01/11/2016  7:24 AM  Line Status No blood return 01/11/2016  7:24 AM  Dressing Type Transparent;Gauze;Occlusive 01/11/2016  7:24 AM  Dressing Status Clean;Dry;Intact 01/11/2016  7:24 AM  Line Care Connections checked and tightened 01/11/2016  7:24 AM  Dressing Intervention Dressing changed;Antimicrobial disc changed 01/11/2016  5:00 AM  Dressing Change Due 01/18/16 01/11/2016  7:24 AM     Chest Tube 1 Right (Active)  Suction To water seal 01/11/2016  8:00 AM  Chest Tube Air Leak None 01/11/2016  8:00 AM  Patency Intervention Milked 01/11/2016  8:00 AM  Drainage Description Dark red 01/11/2016  8:00 AM  Dressing Status Old drainage 01/11/2016  8:00 AM  Dressing Intervention Dressing reinforced 01/09/2016 12:00 AM  Site Assessment Dry;Intact 01/10/2016  8:00 PM  Surrounding Skin Unable to view 01/11/2016  8:00 AM  Output (mL) 20 mL 01/11/2016  5:00 AM     Closed System Drain 2 Right;Lateral Abdomen Bulb (JP) 19 Fr. (Active)  Site Description Unremarkable 01/11/2016  8:00 AM  Dressing Status Clean;Dry;Intact 01/11/2016  8:00 AM  Drainage Appearance Dark red;Brown 01/10/2016  8:00 PM  Status To suction (Charged) 01/11/2016  8:00 AM  Output (mL) 85 mL 01/11/2016  5:00 AM     Closed System Drain 1 Anterior;Midline Abdomen Bulb (JP)  (Active)  Site Description Unable to view 01/11/2016  8:00 AM  Dressing Status Clean;Dry;Intact 01/11/2016  8:00 AM  Drainage Appearance Dark red;Brown 01/10/2016  8:00 PM  Status To suction (Charged) 01/11/2016  8:00 AM  Output (mL) 20 mL 01/11/2016  5:00 AM     NG/OG Tube Orogastric Center mouth (Active)  Placement Verification Auscultation;Xray 01/11/2016  8:00 AM  Site Assessment Clean;Dry;Intact 01/11/2016  8:00 AM  Status Suction-low intermittent 01/11/2016  8:00 AM  Drainage Appearance Green;Brown 01/11/2016  8:00 AM  Intake (mL) 30 mL 01/09/2016  4:00 PM  Output (mL) 100 mL 01/11/2016  5:00 AM     Urethral Catheter [name], MD Temperature probe 16 Fr. (Active)  Indication for Insertion or Continuance of Catheter Bladder outlet obstruction / other urologic reason 01/11/2016  8:00 AM  Site Assessment Clean;Intact 01/11/2016  8:00 AM  Catheter Maintenance Bag below level of bladder;Catheter secured;Drainage bag/tubing not touching floor;Insertion date on drainage bag;No dependent loops;Seal intact 01/11/2016  8:00 AM  Collection Container Standard drainage bag 01/11/2016  8:00 AM  Securement Method Securing device (Describe) 01/11/2016  8:00 AM  Urinary Catheter Interventions Irrigated 01/11/2016  8:00 AM  Input (mL) 60 mL 01/11/2016  8:00 AM  Output (mL) 245 mL 01/11/2016  8:00 AM    Microbiology/Sepsis markers: Results for orders placed or performed during the hospital encounter of 01/03/16  MRSA PCR Screening     Status: None   Collection Time: 01/04/16  2:48 AM  Result Value Ref Range Status   MRSA by PCR NEGATIVE NEGATIVE Final    Comment:        The GeneXpert MRSA Assay (FDA approved for NASAL specimens only), is one component of a comprehensive MRSA colonization surveillance program. It is not intended to diagnose MRSA infection nor to guide or monitor treatment for MRSA infections.   Culture, respiratory (NON-Expectorated)     Status: None (Preliminary result)   Collection  Time: 01/09/16 12:06 PM  Result Value Ref Range Status   Specimen Description TRACHEAL ASPIRATE  Final   Special Requests Normal  Final   Gram Stain   Final    MODERATE WBC PRESENT, PREDOMINANTLY PMN FEW GRAM VARIABLE ROD RARE GRAM POSITIVE COCCI IN PAIRS    Culture CULTURE REINCUBATED FOR BETTER GROWTH  Final   Report Status PENDING  Incomplete    Anti-infectives:  Anti-infectives    Start     Dose/Rate Route Frequency Ordered Stop   01/09/16 2300  vancomycin (VANCOCIN) IVPB 750 mg/150 ml premix  Status:  Discontinued     750 mg 150 mL/hr over 60 Minutes Intravenous Every 12 hours 01/09/16 0936 01/09/16 1546   01/09/16 1200  piperacillin-tazobactam (ZOSYN) IVPB 4.5 g  Status:  Discontinued     4.5 g 200 mL/hr over 30 Minutes Intravenous Every 6 hours 01/09/16 0919 01/09/16 0921   01/09/16 1000  piperacillin-tazobactam (ZOSYN) IVPB 3.375 g     3.375 g 12.5 mL/hr over 240 Minutes Intravenous Every 8 hours 01/09/16 0923     01/09/16 1000  vancomycin (VANCOCIN) 1,500 mg in sodium chloride 0.9 % 500 mL IVPB     1,500 mg 250 mL/hr over 120 Minutes Intravenous  Once 01/09/16 0932 01/09/16 1203   01/09/16 0930  vancomycin (VANCOCIN) 1,500 mg in sodium chloride 0.9 % 500 mL IVPB  Status:  Discontinued     1,500 mg 250 mL/hr over 120 Minutes Intravenous Every 12 hours 01/09/16 0919 01/09/16 0921   01/03/16 2145  ceFAZolin (ANCEF) IVPB 1 g/50 mL premix  Status:  Discontinued     1 g 100 mL/hr over 30 Minutes Intravenous  Once 01/03/16 2132 01/03/16 2136   01/03/16 2145  ceFAZolin (ANCEF) IVPB 2g/100 mL premix     2 g 200 mL/hr over 30 Minutes Intravenous  Once 01/03/16 2136 01/03/16 2230      Best Practice/Protocols:  VTE Prophylaxis: Lovenox (prophylaxtic dose) and Mechanical GI Prophylaxis: Proton Pump Inhibitor Continous Sedation Patient is apparently not being sedated well enough with Precedex and is requiring a lot of supplemental Versed.  Consults:       Events:  Subjective:    Overnight Issues: Agitation requiring boluses of Versed  Objective:  Vital signs for last 24 hours: Temp:  [98.2 F (36.8 C)-101.1 F (38.4 C)] 100.1 F (37.8 C) (06/23 0725) Pulse Rate:  [88-115] 99 (06/23 0800) Resp:  [28-40] 35 (06/23 0800) BP: (91-130)/(41-81) 106/57 mmHg (06/23 0800) SpO2:  [90 %-100 %] 95 % (06/23 0800) FiO2 (%):  [40 %-50 %] 40 % (06/23 0800) Weight:  [87.7 kg (193 lb 5.5 oz)] 87.7 kg (193 lb 5.5 oz) (06/23 0500)  Hemodynamic parameters for last 24 hours:    Intake/Output from previous day: 06/22 0701 - 06/23 0700 In: 4661.5 [I.V.:4461.5; IV Piggyback:200] Out: 2660 [Urine:2025; Emesis/NG output:100; Drains:495; Chest Tube:40]  Intake/Output this shift: Total I/O In: 376.1 [I.V.:316.1; Other:60] Out: 245 [Urine:245]  Vent settings for last 24 hours: Vent Mode:  [-] PRVC FiO2 (%):  [  40 %-50 %] 40 % Set Rate:  [35 bmp] 35 bmp Vt Set:  [470 mL] 470 mL PEEP:  [8 cmH20] 8 cmH20 Plateau Pressure:  [28 cmH20-36 cmH20] 30 cmH20  Physical Exam:  General: alert, no respiratory distress and agitated Neuro: alert, nonfocal exam, RASS 0 and agitated Resp: rhonchi bilaterally CVS: Sinus tachycardia GI: Hypoactive bowel sunds, but definitely present.  Not a lot of OGT output.  500cc of drain output daily.  Midline wound is not granulating well yet Extremities: no edema, no erythema, pulses WNL  Results for orders placed or performed during the hospital encounter of 01/03/16 (from the past 24 hour(s))  Glucose, capillary     Status: Abnormal   Collection Time: 01/10/16 11:10 AM  Result Value Ref Range   Glucose-Capillary 114 (H) 65 - 99 mg/dL  Glucose, capillary     Status: Abnormal   Collection Time: 01/10/16  3:23 PM  Result Value Ref Range   Glucose-Capillary 115 (H) 65 - 99 mg/dL  Glucose, capillary     Status: Abnormal   Collection Time: 01/10/16  7:20 PM  Result Value Ref Range   Glucose-Capillary 111 (H) 65 - 99  mg/dL  Glucose, capillary     Status: Abnormal   Collection Time: 01/10/16 11:30 PM  Result Value Ref Range   Glucose-Capillary 110 (H) 65 - 99 mg/dL  Glucose, capillary     Status: Abnormal   Collection Time: 01/11/16  3:36 AM  Result Value Ref Range   Glucose-Capillary 117 (H) 65 - 99 mg/dL  Blood gas, arterial     Status: Abnormal   Collection Time: 01/11/16  4:20 AM  Result Value Ref Range   FIO2 0.40    Delivery systems VENTILATOR    Mode PRESSURE REGULATED VOLUME CONTROL    VT 470 mL   LHR 35 resp/min   Peep/cpap 8.0 cm H20   pH, Arterial 7.268 (L) 7.350 - 7.450   pCO2 arterial 54.4 (H) 35.0 - 45.0 mmHg   pO2, Arterial 62.3 (L) 80.0 - 100.0 mmHg   Bicarbonate 23.6 20.0 - 24.0 mEq/L   TCO2 25.1 0 - 100 mmol/L   Acid-base deficit 2.1 (H) 0.0 - 2.0 mmol/L   O2 Saturation 86.1 %   Patient temperature 101.1    Collection site RIGHT RADIAL    Drawn by [phone]    Sample type ARTERIAL DRAW    Allens test (pass/fail) PASS PASS  CBC with Differential/Platelet     Status: Abnormal   Collection Time: 01/11/16  5:37 AM  Result Value Ref Range   WBC 15.1 (H) 4.0 - 10.5 K/uL   RBC 2.23 (L) 4.22 - 5.81 MIL/uL   Hemoglobin 6.4 (LL) 13.0 - 17.0 g/dL   HCT 40.920.0 (L) 81.139.0 - 91.452.0 %   MCV 89.7 78.0 - 100.0 fL   MCH 28.7 26.0 - 34.0 pg   MCHC 32.0 30.0 - 36.0 g/dL   RDW 78.215.1 95.611.5 - 21.315.5 %   Platelets 121 (L) 150 - 400 K/uL   Neutrophils Relative % 85 %   Lymphocytes Relative 8 %   Monocytes Relative 4 %   Eosinophils Relative 3 %   Basophils Relative 0 %   Neutro Abs 12.8 (H) 1.7 - 7.7 K/uL   Lymphs Abs 1.2 0.7 - 4.0 K/uL   Monocytes Absolute 0.6 0.1 - 1.0 K/uL   Eosinophils Absolute 0.5 0.0 - 0.7 K/uL   Basophils Absolute 0.0 0.0 - 0.1 K/uL   RBC Morphology POLYCHROMASIA PRESENT  WBC Morphology VACUOLATED NEUTROPHILS   Basic metabolic panel     Status: Abnormal   Collection Time: 01/11/16  5:37 AM  Result Value Ref Range   Sodium 143 135 - 145 mmol/L   Potassium 4.9 3.5 - 5.1  mmol/L   Chloride 117 (H) 101 - 111 mmol/L   CO2 23 22 - 32 mmol/L   Glucose, Bld 161 (H) 65 - 99 mg/dL   BUN 33 (H) 6 - 20 mg/dL   Creatinine, Ser 1.61 (H) 0.61 - 1.24 mg/dL   Calcium 7.1 (L) 8.9 - 10.3 mg/dL   GFR calc non Af Amer 31 (L) >60 mL/min   GFR calc Af Amer 36 (L) >60 mL/min   Anion gap 3 (L) 5 - 15  Protime-INR     Status: Abnormal   Collection Time: 01/11/16  6:35 AM  Result Value Ref Range   Prothrombin Time 16.7 (H) 11.6 - 15.2 seconds   INR 1.34 0.00 - 1.49  Type and screen [facility]     Status: None (Preliminary result)   Collection Time: 01/11/16  6:35 AM  Result Value Ref Range   ABO/RH(D) AB POS    Antibody Screen NEG    Sample Expiration 01/14/2016    Unit Number [id number]    Blood Component Type RED CELLS,LR    Unit division 00    Status of Unit ALLOCATED    Transfusion Status OK TO TRANSFUSE    Crossmatch Result Compatible   Prepare RBC     Status: None   Collection Time: 01/11/16  6:35 AM  Result Value Ref Range   Order Confirmation ORDER PROCESSED BY BLOOD BANK   Glucose, capillary     Status: Abnormal   Collection Time: 01/11/16  7:23 AM  Result Value Ref Range   Glucose-Capillary 118 (H) 65 - 99 mg/dL     Assessment/Plan:   NEURO  Altered Mental Status:  agitation, change in mental status, delirium and sedation   Plan: Will need to work on sedation so that we can keep him doing fine with lungs are recovering from aspiration pneumonitis  PULM  Respiratory Acidosis (acute) Atelectasis/collapse (complete and only area spared is the RUL) ARDS (pulmonary etiology aspiration pneumonia) and aspiration pneumonia   Plan: Continue antibiotics and give tha patient time to recover  CARDIO  Sinus Tachycardia   Plan: No specific treatment  RENAL  Respiratory Acidosis (acute) Actue Renal Failure (etiology unknown)   Plan: Continue to follow renal function.  GI  High drain output, but OGT output is low..  Will try to start tbe  feedings again through Cortrak post-pyloric placement.   Plan: Looks as if the patient has a significant bile leak.  ID  Unknown what bacteria is the culprit, but on empiric Zosyn.  Still on ARDSnet protocol   Plan:   ABG a bit better.  HEME  Anemia acute blood loss anemia)   Plan: Getting one unit of blood  ENDO No specific issues   Plan: CPM  Global Issues  TYry to get postpyloric tube passed and restart tube feedings.  If he cannot tolerate this, he may need TPN.  Sedation does not seem adequate and he his getting frequent boluses of Versed.  Will replace the Precedex with Versed and start enteric valium and seroquel.    LOS: 7 days   Additional comments:I reviewed the patient's new clinical lab test results. cbc/bmet and I reviewed the patients new imaging test results. cxr  Critical  Care Total Time*: 42 minutes of critical care management and evaluation.  Meliana Canner 01/11/2016  *Care during the described time interval was provided by me and/or other providers on the critical care team.  I have reviewed this patient's available data, including medical history, events of note, physical examination and test results as part of my evaluation.

## 2016-01-12 ENCOUNTER — Inpatient Hospital Stay (HOSPITAL_COMMUNITY): Payer: Medicaid Other

## 2016-01-12 LAB — BLOOD GAS, ARTERIAL
Acid-base deficit: 2.8 mmol/L — ABNORMAL HIGH (ref 0.0–2.0)
Acid-base deficit: 3.1 mmol/L — ABNORMAL HIGH (ref 0.0–2.0)
Acid-base deficit: 2.9 mmol/L — ABNORMAL HIGH (ref 0.0–2.0)
Bicarbonate: 23.1 mEq/L (ref 20.0–24.0)
Bicarbonate: 22.7 mEq/L (ref 20.0–24.0)
Bicarbonate: 23.5 mEq/L (ref 20.0–24.0)
Drawn by: 236041
Drawn by: 28098
FIO2: 0.5
FIO2: 0.6
FIO2: 0.6
MECHVT: 470 mL
MECHVT: 470 mL
MECHVT: 470 mL
O2 Saturation: 75.4 %
O2 Saturation: 83.6 %
O2 Saturation: 89.6 %
PEEP: 5 cmH2O
PEEP: 8 cmH2O
PEEP: 8 cmH2O
Patient temperature: 100.4
Patient temperature: 100.4
Patient temperature: 98.6
RATE: 35 resp/min
RATE: 35 resp/min
RATE: 35 resp/min
TCO2: 24.7 mmol/L (ref 0–100)
TCO2: 24.2 mmol/L (ref 0–100)
TCO2: 25.2 mmol/L (ref 0–100)
pCO2 arterial: 55 mmHg — ABNORMAL HIGH (ref 35.0–45.0)
pCO2 arterial: 52.4 mmHg — ABNORMAL HIGH (ref 35.0–45.0)
pCO2 arterial: 56.7 mmHg — ABNORMAL HIGH (ref 35.0–45.0)
pH, Arterial: 7.254 — ABNORMAL LOW (ref 7.350–7.450)
pH, Arterial: 7.265 — ABNORMAL LOW (ref 7.350–7.450)
pH, Arterial: 7.241 — ABNORMAL LOW (ref 7.350–7.450)
pO2, Arterial: 49.8 mmHg — ABNORMAL LOW (ref 80.0–100.0)
pO2, Arterial: 57.4 mmHg — ABNORMAL LOW (ref 80.0–100.0)
pO2, Arterial: 66 mmHg — ABNORMAL LOW (ref 80.0–100.0)

## 2016-01-12 LAB — GLUCOSE, CAPILLARY
Glucose-Capillary: 135 mg/dL — ABNORMAL HIGH (ref 65–99)
Glucose-Capillary: 127 mg/dL — ABNORMAL HIGH (ref 65–99)
Glucose-Capillary: 114 mg/dL — ABNORMAL HIGH (ref 65–99)
Glucose-Capillary: 117 mg/dL — ABNORMAL HIGH (ref 65–99)
Glucose-Capillary: 126 mg/dL — ABNORMAL HIGH (ref 65–99)
Glucose-Capillary: 102 mg/dL — ABNORMAL HIGH (ref 65–99)

## 2016-01-12 LAB — CBC WITH DIFFERENTIAL/PLATELET
Basophils Absolute: 0 10*3/uL (ref 0.0–0.1)
Basophils Relative: 0 %
Eosinophils Absolute: 0.3 10*3/uL (ref 0.0–0.7)
Eosinophils Relative: 2 %
HCT: 23 % — ABNORMAL LOW (ref 39.0–52.0)
Hemoglobin: 7.3 g/dL — ABNORMAL LOW (ref 13.0–17.0)
Lymphocytes Relative: 9 %
Lymphs Abs: 1.4 10*3/uL (ref 0.7–4.0)
MCH: 28.1 pg (ref 26.0–34.0)
MCHC: 31.7 g/dL (ref 30.0–36.0)
MCV: 88.5 fL (ref 78.0–100.0)
Monocytes Absolute: 1.2 10*3/uL — ABNORMAL HIGH (ref 0.1–1.0)
Monocytes Relative: 8 %
Neutro Abs: 12.7 10*3/uL — ABNORMAL HIGH (ref 1.7–7.7)
Neutrophils Relative %: 81 %
Platelets: 146 10*3/uL — ABNORMAL LOW (ref 150–400)
RBC: 2.6 MIL/uL — ABNORMAL LOW (ref 4.22–5.81)
RDW: 15.7 % — ABNORMAL HIGH (ref 11.5–15.5)
WBC: 15.6 10*3/uL — ABNORMAL HIGH (ref 4.0–10.5)

## 2016-01-12 LAB — BASIC METABOLIC PANEL
Anion gap: 3 — ABNORMAL LOW (ref 5–15)
BUN: 45 mg/dL — ABNORMAL HIGH (ref 6–20)
CO2: 23 mmol/L (ref 22–32)
Calcium: 7.5 mg/dL — ABNORMAL LOW (ref 8.9–10.3)
Chloride: 116 mmol/L — ABNORMAL HIGH (ref 101–111)
Creatinine, Ser: 3.38 mg/dL — ABNORMAL HIGH (ref 0.61–1.24)
GFR calc Af Amer: 28 mL/min — ABNORMAL LOW (ref >60)
GFR calc non Af Amer: 24 mL/min — ABNORMAL LOW (ref >60)
Glucose, Bld: 123 mg/dL — ABNORMAL HIGH (ref 65–99)
Potassium: 4.8 mmol/L (ref 3.5–5.1)
Sodium: 142 mmol/L (ref 135–145)

## 2016-01-12 MED ORDER — SODIUM CHLORIDE 0.9 % IV SOLN
0.0000 mg/h | INTRAVENOUS | Status: DC
  Administered 2016-01-12: 2 mg/h via INTRAVENOUS
  Administered 2016-01-12 – 2016-01-13 (×3): 10 mg/h via INTRAVENOUS
  Filled 2016-01-12 (×5): qty 10

## 2016-01-12 MED ORDER — MIDAZOLAM BOLUS VIA INFUSION
1.0000 mg | INTRAVENOUS | Status: DC | PRN
  Filled 2016-01-12: qty 2

## 2016-01-12 MED ORDER — ACETAMINOPHEN 160 MG/5ML PO SOLN: 650.0000 mg | Freq: Four times a day (QID) | ORAL | Status: DC | PRN

## 2016-01-12 NOTE — Progress Notes (Signed)
Follow up - Trauma and Critical Care  Patient Details:    Samuel Owens is an 20 y.o. male.  Lines/tubes : Airway 8 mm (Active)  Secured at (cm) 25 cm 01/12/2016 11:03 AM  Measured From Lips 01/12/2016 11:03 AM  Secured Location Right 01/12/2016 11:03 AM  Secured By [employer] Tube Holder 01/12/2016 11:03 AM  Tube Holder Repositioned Yes 01/12/2016 11:03 AM  Cuff Pressure (cm H2O) 24 cm H2O 01/10/2016  8:11 PM  Site Condition Dry 01/12/2016 11:03 AM     PICC Triple Lumen 01/11/16 PICC Left Basilic 41 cm 0 cm (Active)  Indication for Insertion or Continuance of Line Prolonged intravenous therapies 01/12/2016  8:00 AM  Exposed Catheter (cm) 0 cm 01/11/2016  2:00 PM  Site Assessment Clean;Dry;Intact 01/12/2016  8:00 AM  Lumen #1 Status Infusing 01/12/2016  8:00 AM  Lumen #2 Status Infusing 01/12/2016  8:00 AM  Lumen #3 Status Capped (Central line);Saline locked 01/12/2016  8:00 AM  Dressing Type Transparent 01/12/2016  8:00 AM  Dressing Status Clean;Dry;Intact;Antimicrobial disc in place 01/12/2016  8:00 AM  Line Care Connections checked and tightened 01/12/2016  8:00 AM  Dressing Change Due 01/18/16 01/12/2016  8:00 AM     Chest Tube 1 Right (Active)  Suction To water seal 01/11/2016 11:00 PM  Chest Tube Air Leak None 01/11/2016 11:00 PM  Patency Intervention Tip/tilt 01/11/2016 11:00 PM  Drainage Description Dark red 01/11/2016 11:00 PM  Dressing Status Old drainage 01/11/2016 11:00 PM  Dressing Intervention Dressing reinforced 01/09/2016 12:00 AM  Site Assessment Dry;Intact 01/11/2016  8:00 PM  Surrounding Skin Unable to view 01/11/2016 11:00 PM  Output (mL) 20 mL 01/12/2016  3:00 AM     Closed System Drain 2 Right;Lateral Abdomen Bulb (JP) 19 Fr. (Active)  Site Description Unremarkable 01/12/2016  8:00 AM  Dressing Status Clean;Dry;Intact 01/12/2016  8:00 AM  Drainage Appearance Brown 01/12/2016  8:00 AM  Status To suction (Charged) 01/12/2016  8:00 AM  Output (mL) 30 mL 01/12/2016  8:00 AM      Closed System Drain 1 Anterior;Midline Abdomen Bulb (JP) (Active)  Site Description Unremarkable 01/12/2016  8:00 AM  Dressing Status Clean;Dry;Intact 01/12/2016  8:00 AM  Drainage Appearance Brown 01/12/2016  8:00 AM  Status To suction (Charged) 01/12/2016  8:00 AM  Output (mL) 20 mL 01/12/2016  3:00 AM     Urethral Catheter [name], MD Temperature probe 16 Fr. (Active)  Indication for Insertion or Continuance of Catheter Bladder outlet obstruction / other urologic reason 01/12/2016  8:00 AM  Site Assessment Clean;Intact 01/12/2016  8:00 AM  Catheter Maintenance Bag below level of bladder;Catheter secured;Drainage bag/tubing not touching floor;Insertion date on drainage bag;No dependent loops 01/12/2016  8:00 AM  Collection Container Standard drainage bag 01/12/2016  8:00 AM  Securement Method Securing device (Describe) 01/12/2016  8:00 AM  Urinary Catheter Interventions Irrigated 01/12/2016  8:00 AM  Input (mL) 60 mL 01/11/2016  8:00 PM  Output (mL) 85 mL 01/12/2016 10:00 AM    Microbiology/Sepsis markers: Results for orders placed or performed during the hospital encounter of 01/03/16  MRSA PCR Screening     Status: None   Collection Time: 01/04/16  2:48 AM  Result Value Ref Range Status   MRSA by PCR NEGATIVE NEGATIVE Final    Comment:        The GeneXpert MRSA Assay (FDA approved for NASAL specimens only), is one component of a comprehensive MRSA colonization surveillance program. It is not intended to diagnose MRSA infection nor to  guide or monitor treatment for MRSA infections.   Culture, respiratory (NON-Expectorated)     Status: None   Collection Time: 01/09/16 12:06 PM  Result Value Ref Range Status   Specimen Description TRACHEAL ASPIRATE  Final   Special Requests Normal  Final   Gram Stain   Final    MODERATE WBC PRESENT, PREDOMINANTLY PMN FEW GRAM VARIABLE ROD RARE GRAM POSITIVE COCCI IN PAIRS    Culture Consistent with normal respiratory flora.  Final   Report  Status 01/11/2016 FINAL  Final    Anti-infectives:  Anti-infectives    Start     Dose/Rate Route Frequency Ordered Stop   01/09/16 2300  vancomycin (VANCOCIN) IVPB 750 mg/150 ml premix  Status:  Discontinued     750 mg 150 mL/hr over 60 Minutes Intravenous Every 12 hours 01/09/16 0936 01/09/16 1546   01/09/16 1200  piperacillin-tazobactam (ZOSYN) IVPB 4.5 g  Status:  Discontinued     4.5 g 200 mL/hr over 30 Minutes Intravenous Every 6 hours 01/09/16 0919 01/09/16 0921   01/09/16 1000  piperacillin-tazobactam (ZOSYN) IVPB 3.375 g     3.375 g 12.5 mL/hr over 240 Minutes Intravenous Every 8 hours 01/09/16 0923     01/09/16 1000  vancomycin (VANCOCIN) 1,500 mg in sodium chloride 0.9 % 500 mL IVPB     1,500 mg 250 mL/hr over 120 Minutes Intravenous  Once 01/09/16 0932 01/09/16 1203   01/09/16 0930  vancomycin (VANCOCIN) 1,500 mg in sodium chloride 0.9 % 500 mL IVPB  Status:  Discontinued     1,500 mg 250 mL/hr over 120 Minutes Intravenous Every 12 hours 01/09/16 0919 01/09/16 0921   01/03/16 2145  ceFAZolin (ANCEF) IVPB 1 g/50 mL premix  Status:  Discontinued     1 g 100 mL/hr over 30 Minutes Intravenous  Once 01/03/16 2132 01/03/16 2136   01/03/16 2145  ceFAZolin (ANCEF) IVPB 2g/100 mL premix     2 g 200 mL/hr over 30 Minutes Intravenous  Once 01/03/16 2136 01/03/16 2230      Best Practice/Protocols:  VTE Prophylaxis: Lovenox (prophylaxtic dose) and Mechanical ARDS  Consults:      Events:  Subjective:    Overnight Issues: Worsening resp status with hypoxemia, this morning continues acidosis with hight rate ventilation  Objective:  Vital signs for last 24 hours: Temp:  [98.1 F (36.7 C)-102.5 F (39.2 C)] 101.8 F (38.8 C) (06/24 0800) Pulse Rate:  [99-110] 102 (06/24 1103) Resp:  [27-44] 37 (06/24 1103) BP: (97-153)/(47-78) 103/54 mmHg (06/24 1103) SpO2:  [87 %-100 %] 94 % (06/24 1103) FiO2 (%):  [40 %-60 %] 60 % (06/24 1103) Weight:  [91.3 kg (201 lb 4.5 oz)] 91.3  kg (201 lb 4.5 oz) (06/24 0500)  Hemodynamic parameters for last 24 hours:    Intake/Output from previous day: 06/23 0701 - 06/24 0700 In: 5341.4 [I.V.:4616.4; Blood:185; NG/GT:270; IV Piggyback:150] Out: 2730 [Urine:2345; Drains:355; Chest Tube:30]  Intake/Output this shift: Total I/O In: 623.3 [I.V.:573.3; IV Piggyback:50] Out: 370 [Urine:340; Drains:30]  Vent settings for last 24 hours: Vent Mode:  [-] PRVC FiO2 (%):  [40 %-60 %] 60 % Set Rate:  [35 bmp] 35 bmp Vt Set:  [470 mL] 470 mL PEEP:  [5 cmH20-8 cmH20] 8 cmH20 Plateau Pressure:  [25 cmH20-26 cmH20] 26 cmH20  Physical Exam:  Gen: intubated, sedated HEENT: Tube in place Resp: coarse b/l Cardiovascular: tachy Abdomen: soft, slight distension, wound open with fibrinous base Ext: no edema Neuro: GCS 6t  Results for orders placed  or performed during the hospital encounter of 01/03/16 (from the past 24 hour(s))  Glucose, capillary     Status: Abnormal   Collection Time: 01/11/16  3:35 PM  Result Value Ref Range   Glucose-Capillary 126 (H) 65 - 99 mg/dL  Glucose, capillary     Status: Abnormal   Collection Time: 01/11/16  7:10 PM  Result Value Ref Range   Glucose-Capillary 118 (H) 65 - 99 mg/dL  Glucose, capillary     Status: Abnormal   Collection Time: 01/11/16 11:07 PM  Result Value Ref Range   Glucose-Capillary 135 (H) 65 - 99 mg/dL  Glucose, capillary     Status: Abnormal   Collection Time: 01/12/16  3:06 AM  Result Value Ref Range   Glucose-Capillary 127 (H) 65 - 99 mg/dL  Blood gas, arterial     Status: Abnormal   Collection Time: 01/12/16  3:25 AM  Result Value Ref Range   FIO2 0.50    Delivery systems VENTILATOR    Mode PRESSURE REGULATED VOLUME CONTROL    VT 470 mL   LHR 35 resp/min   Peep/cpap 5.0 cm H20   pH, Arterial 7.254 (L) 7.350 - 7.450   pCO2 arterial 55.0 (H) 35.0 - 45.0 mmHg   pO2, Arterial 49.8 (L) 80.0 - 100.0 mmHg   Bicarbonate 23.1 20.0 - 24.0 mEq/L   TCO2 24.7 0 - 100 mmol/L    Acid-base deficit 2.8 (H) 0.0 - 2.0 mmol/L   O2 Saturation 75.4 %   Patient temperature 100.4    Collection site BRACHIAL ARTERY    Drawn by [id number]    Sample type ARTERIAL DRAW   Blood gas, arterial     Status: Abnormal   Collection Time: 01/12/16  5:00 AM  Result Value Ref Range   FIO2 0.60    Delivery systems VENTILATOR    Mode PRESSURE REGULATED VOLUME CONTROL    VT 470 mL   LHR 35 resp/min   Peep/cpap 8.0 cm H20   pH, Arterial 7.265 (L) 7.350 - 7.450   pCO2 arterial 52.4 (H) 35.0 - 45.0 mmHg   pO2, Arterial 57.4 (L) 80.0 - 100.0 mmHg   Bicarbonate 22.7 20.0 - 24.0 mEq/L   TCO2 24.2 0 - 100 mmol/L   Acid-base deficit 3.1 (H) 0.0 - 2.0 mmol/L   O2 Saturation 83.6 %   Patient temperature 100.4    Collection site RIGHT RADIAL    Drawn by DR Lindie Spruce    Sample type ARTERIAL DRAW    Allens test (pass/fail) PASS PASS  CBC with Differential/Platelet     Status: Abnormal   Collection Time: 01/12/16  5:19 AM  Result Value Ref Range   WBC 15.6 (H) 4.0 - 10.5 K/uL   RBC 2.60 (L) 4.22 - 5.81 MIL/uL   Hemoglobin 7.3 (L) 13.0 - 17.0 g/dL   HCT 04.5 (L) 40.9 - 81.1 %   MCV 88.5 78.0 - 100.0 fL   MCH 28.1 26.0 - 34.0 pg   MCHC 31.7 30.0 - 36.0 g/dL   RDW 91.4 (H) 78.2 - 95.6 %   Platelets 146 (L) 150 - 400 K/uL   Neutrophils Relative % 81 %   Lymphocytes Relative 9 %   Monocytes Relative 8 %   Eosinophils Relative 2 %   Basophils Relative 0 %   Neutro Abs 12.7 (H) 1.7 - 7.7 K/uL   Lymphs Abs 1.4 0.7 - 4.0 K/uL   Monocytes Absolute 1.2 (H) 0.1 - 1.0 K/uL   Eosinophils Absolute  0.3 0.0 - 0.7 K/uL   Basophils Absolute 0.0 0.0 - 0.1 K/uL   RBC Morphology POLYCHROMASIA PRESENT    WBC Morphology TOXIC GRANULATION   Basic metabolic panel     Status: Abnormal   Collection Time: 01/12/16  5:19 AM  Result Value Ref Range   Sodium 142 135 - 145 mmol/L   Potassium 4.8 3.5 - 5.1 mmol/L   Chloride 116 (H) 101 - 111 mmol/L   CO2 23 22 - 32 mmol/L   Glucose, Bld 123 (H) 65 - 99 mg/dL   BUN  45 (H) 6 - 20 mg/dL   Creatinine, Ser 1.613.38 (H) 0.61 - 1.24 mg/dL   Calcium 7.5 (L) 8.9 - 10.3 mg/dL   GFR calc non Af Amer 24 (L) >60 mL/min   GFR calc Af Amer 28 (L) >60 mL/min   Anion gap 3 (L) 5 - 15  Glucose, capillary     Status: Abnormal   Collection Time: 01/12/16  8:46 AM  Result Value Ref Range   Glucose-Capillary 114 (H) 65 - 99 mg/dL     Assessment/Plan:   NEURO  Delierium, AMs   Plan: continue sedation, may increase due to resp status  PULM  ARDS, aspiration pneumonitis   Plan: permissive hypercapnia, optimized for oxygenation, sedation trial  CARDIO  tachycardic   Plan: supportive care  RENAL  No issues   Plan: continues strict I/Os  GI  S/p ex lap   Plan: on tube feeds  ID  Aspiration pneumonia/pneumonitis   Plan: continue empiric abx  HEME  anemia   Plan: continue to monitor  ENDO No issues   Plan: CPM  Global Issues  Continue tube feeds Switch to versed Continue ards net    LOS: 8 days   Additional comments:I reviewed the patient's new clinical lab test results. CO2 50s, O2 57  Critical Care Total Time*: 30 Minutes  De BlanchLuke Aaron Kinsinger 01/12/2016  *Care during the described time interval was provided by me and/or other providers on the critical care team.  I have reviewed this patient's available data, including medical history, events of note, physical examination and test results as part of my evaluation.

## 2016-01-13 ENCOUNTER — Inpatient Hospital Stay (HOSPITAL_COMMUNITY): Payer: Medicaid Other

## 2016-01-13 DIAGNOSIS — J80 Acute respiratory distress syndrome: Secondary | ICD-10-CM

## 2016-01-13 LAB — POCT I-STAT 3, ART BLOOD GAS (G3+)
Acid-base deficit: 7 mmol/L — ABNORMAL HIGH (ref 0.0–2.0)
Bicarbonate: 21.2 mEq/L (ref 20.0–24.0)
O2 Saturation: 97 %
Patient temperature: 97.7
TCO2: 23 mmol/L (ref 0–100)
pCO2 arterial: 56.7 mmHg — ABNORMAL HIGH (ref 35.0–45.0)
pH, Arterial: 7.177 — CL (ref 7.350–7.450)
pO2, Arterial: 119 mmHg — ABNORMAL HIGH (ref 80.0–100.0)

## 2016-01-13 LAB — PROTIME-INR
INR: 1.46 (ref 0.00–1.49)
Prothrombin Time: 17.8 seconds — ABNORMAL HIGH (ref 11.6–15.2)

## 2016-01-13 LAB — BLOOD GAS, ARTERIAL
Acid-base deficit: 4.6 mmol/L — ABNORMAL HIGH (ref 0.0–2.0)
Bicarbonate: 22.2 mEq/L (ref 20.0–24.0)
Drawn by: 42624
FIO2: 0.8
MECHVT: 470 mL
O2 Saturation: 91.8 %
PEEP: 8 cmH2O
Patient temperature: 98.6
RATE: 35 resp/min
TCO2: 24.1 mmol/L (ref 0–100)
pCO2 arterial: 59.1 mmHg (ref 35.0–45.0)
pH, Arterial: 7.201 — ABNORMAL LOW (ref 7.350–7.450)
pO2, Arterial: 71.4 mmHg — ABNORMAL LOW (ref 80.0–100.0)

## 2016-01-13 LAB — GLUCOSE, CAPILLARY
Glucose-Capillary: 101 mg/dL — ABNORMAL HIGH (ref 65–99)
Glucose-Capillary: 112 mg/dL — ABNORMAL HIGH (ref 65–99)
Glucose-Capillary: 112 mg/dL — ABNORMAL HIGH (ref 65–99)
Glucose-Capillary: 115 mg/dL — ABNORMAL HIGH (ref 65–99)
Glucose-Capillary: 117 mg/dL — ABNORMAL HIGH (ref 65–99)
Glucose-Capillary: 121 mg/dL — ABNORMAL HIGH (ref 65–99)
Glucose-Capillary: 99 mg/dL (ref 65–99)

## 2016-01-13 MED ORDER — DEXMEDETOMIDINE HCL IN NACL 200 MCG/50ML IV SOLN
0.0000 ug/kg/h | INTRAVENOUS | Status: DC
  Filled 2016-01-13: qty 50

## 2016-01-13 MED ORDER — SODIUM CHLORIDE 0.9 % IV SOLN
100.0000 ug/h | INTRAVENOUS | Status: DC
  Administered 2016-01-13 – 2016-01-14 (×3): 300 ug/h via INTRAVENOUS
  Administered 2016-01-15 – 2016-01-16 (×3): 400 ug/h via INTRAVENOUS
  Administered 2016-01-17 (×2): 150 ug/h via INTRAVENOUS
  Administered 2016-01-18 – 2016-01-22 (×6): 200 ug/h via INTRAVENOUS
  Administered 2016-01-22: 225 ug/h via INTRAVENOUS
  Administered 2016-01-22: 250 ug/h via INTRAVENOUS
  Administered 2016-01-23: 200 ug/h via INTRAVENOUS
  Filled 2016-01-13 (×21): qty 50

## 2016-01-13 MED ORDER — MIDAZOLAM HCL 2 MG/2ML IJ SOLN
2.0000 mg | Freq: Once | INTRAMUSCULAR | Status: AC
  Administered 2016-01-13: 2 mg via INTRAVENOUS

## 2016-01-13 MED ORDER — STERILE WATER FOR INJECTION IV SOLN
INTRAVENOUS | Status: DC
  Administered 2016-01-13 – 2016-01-16 (×7): via INTRAVENOUS
  Filled 2016-01-13 (×12): qty 850

## 2016-01-13 MED ORDER — DIATRIZOATE MEGLUMINE & SODIUM 66-10 % PO SOLN
ORAL | Status: AC
  Administered 2016-01-13: 12:00:00
  Filled 2016-01-13: qty 30

## 2016-01-13 MED ORDER — CISATRACURIUM BOLUS VIA INFUSION
0.0500 mg/kg | Freq: Once | INTRAVENOUS | Status: AC
  Administered 2016-01-13: 4.7 mg via INTRAVENOUS
  Filled 2016-01-13: qty 5

## 2016-01-13 MED ORDER — ARTIFICIAL TEARS OP OINT
1.0000 "application " | TOPICAL_OINTMENT | Freq: Three times a day (TID) | OPHTHALMIC | Status: DC
  Administered 2016-01-13 – 2016-01-16 (×11): 1 via OPHTHALMIC
  Filled 2016-01-13: qty 3.5

## 2016-01-13 MED ORDER — FENTANYL CITRATE (PF) 100 MCG/2ML IJ SOLN
100.0000 ug | Freq: Once | INTRAMUSCULAR | Status: AC
  Administered 2016-01-13: 100 ug via INTRAVENOUS

## 2016-01-13 MED ORDER — MUPIROCIN 2 % EX OINT
TOPICAL_OINTMENT | CUTANEOUS | Status: AC
  Filled 2016-01-13: qty 22

## 2016-01-13 MED ORDER — SODIUM CHLORIDE 0.9 % IV SOLN
3.0000 ug/kg/min | INTRAVENOUS | Status: DC
  Administered 2016-01-13: 3 ug/kg/min via INTRAVENOUS
  Administered 2016-01-13: 9 ug/kg/min via INTRAVENOUS
  Administered 2016-01-14 – 2016-01-16 (×13): 10 ug/kg/min via INTRAVENOUS
  Filled 2016-01-13 (×15): qty 20

## 2016-01-13 MED ORDER — FENTANYL BOLUS VIA INFUSION
50.0000 ug | INTRAVENOUS | Status: DC | PRN
  Filled 2016-01-13: qty 50

## 2016-01-13 MED ORDER — ACETAMINOPHEN 160 MG/5ML PO SOLN
650.0000 mg | Freq: Four times a day (QID) | ORAL | Status: DC | PRN
  Administered 2016-01-13 – 2016-02-08 (×6): 650 mg
  Filled 2016-01-13 (×6): qty 20.3

## 2016-01-13 MED ORDER — MIDAZOLAM BOLUS VIA INFUSION
2.0000 mg | INTRAVENOUS | Status: DC | PRN
  Filled 2016-01-13: qty 2

## 2016-01-13 MED ORDER — FENTANYL CITRATE (PF) 100 MCG/2ML IJ SOLN: 100.0000 ug | Freq: Once | INTRAMUSCULAR | Status: DC | PRN

## 2016-01-13 MED ORDER — SODIUM CHLORIDE 0.9 % IV SOLN
2.0000 mg/h | INTRAVENOUS | Status: DC
  Administered 2016-01-13 – 2016-01-15 (×10): 10 mg/h via INTRAVENOUS
  Filled 2016-01-13 (×10): qty 10

## 2016-01-13 MED ORDER — MIDAZOLAM HCL 2 MG/2ML IJ SOLN: 2.0000 mg | Freq: Once | INTRAMUSCULAR | Status: DC | PRN

## 2016-01-13 NOTE — Progress Notes (Addendum)
I was able to reach MD: CRITICAL VALUE ALERT  Critical value received:  ABG at 1704  Date of notification:  01/13/2016  Time of notification:  1720  Critical value read back:Yes.    Nurse who received alert:  Dr. Levada SchillingSummers  MD notified (1st page):  1710  Time of first page:  1710  MD notified (2nd page):  Time of second page:  Responding MD:  Dr Levada SchillingSummers  Time MD responded:  [phone]  Per MD- no vent changes now, MD ordering Bicarb drip.  RN aware.

## 2016-01-13 NOTE — Progress Notes (Signed)
While pt was in CT scan- pt was supine for CT.  PT vomited (appeared to be curdled tube feedings), pt was immediately rolled onto his side, mouth and lungs were immediately suctioned out.  Per RN, tube feedings have been off for approximately 3 hours prior to transport.

## 2016-01-13 NOTE — Progress Notes (Signed)
eLink Physician-Brief Progress Note Patient Name: Samuel PaganShiquan XXXmoore DOB: July 15, 1996 MRN: [id number]   Date of Service  01/13/2016  HPI/Events of Note  ABG on 90%/PRVC 35/TV 470/P 16 = 7.18/56.7/119/21. On ARDS protocol and no room to push the rate or TV up.   eICU Interventions  Will order: 1. NaHCO3 IV infusion to run IV at 100 mL/hour.  2. ABG at 11 PM.     Intervention Category Major Interventions: Acid-Base disturbance - evaluation and management  Sommer,Steven Eugene 01/13/2016, 5:24 PM

## 2016-01-13 NOTE — Progress Notes (Signed)
Spoke with Dr. Delton CoombesByrum about patient still have 4/4 twitches on 9 of Nimbex with 10 being the max dose ordered. Patient is synchronous with the ventilator. Was told to keep patient at 9 regardless of 4/4 twitches on TOF.
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
## 2016-01-13 NOTE — Progress Notes (Signed)
Per Dr Delton CoombesByrum, wait to obtain ABG s/p pt on paralytic.

## 2016-01-13 NOTE — Progress Notes (Signed)
Follow up - Trauma and Critical Care  Patient Details:    Samuel Owens is an 20 y.o. male.  Lines/tubes : Airway 8 mm (Active)  Secured at (cm) 25 cm 01/13/2016  8:25 AM  Measured From Lips 01/13/2016  8:25 AM  Secured Location Center 01/13/2016  8:25 AM  Secured By [employer] Tube Holder 01/13/2016  8:25 AM  Tube Holder Repositioned Yes 01/13/2016  8:25 AM  Cuff Pressure (cm H2O) 24 cm H2O 01/10/2016  8:11 PM  Site Condition Dry 01/13/2016  8:25 AM     PICC Triple Lumen 01/11/16 PICC Left Basilic 41 cm 0 cm (Active)  Indication for Insertion or Continuance of Line Prolonged intravenous therapies 01/13/2016  8:00 AM  Exposed Catheter (cm) 0 cm 01/11/2016  2:00 PM  Site Assessment Clean;Dry;Intact 01/13/2016  8:00 AM  Lumen #1 Status Infusing 01/13/2016  8:00 AM  Lumen #2 Status Infusing 01/13/2016  8:00 AM  Lumen #3 Status Infusing 01/13/2016  8:00 AM  Dressing Type Transparent 01/13/2016  8:00 AM  Dressing Status Clean;Dry;Intact;Antimicrobial disc in place 01/13/2016  8:00 AM  Line Care Connections checked and tightened 01/13/2016  8:00 AM  Dressing Change Due 01/18/16 01/12/2016  8:00 PM     Chest Tube 1 Right (Active)  Suction To water seal 01/13/2016  8:00 AM  Chest Tube Air Leak None 01/13/2016  8:00 AM  Patency Intervention Milked 01/12/2016  8:00 PM  Drainage Description Dark red 01/13/2016  8:00 AM  Dressing Status Old drainage;Dry;Intact 01/13/2016  8:00 AM  Dressing Intervention Dressing reinforced 01/09/2016 12:00 AM  Site Assessment Dry;Intact 01/11/2016  8:00 PM  Surrounding Skin Unable to view 01/12/2016  8:00 PM  Output (mL) 20 mL 01/13/2016  6:00 AM     Closed System Drain 2 Right;Lateral Abdomen Bulb (JP) 19 Fr. (Active)  Site Description Unremarkable 01/13/2016  8:00 AM  Dressing Status Clean;Dry;Intact 01/13/2016  8:00 AM  Drainage Appearance Brown 01/13/2016  8:00 AM  Status To suction (Charged) 01/12/2016  8:00 PM  Output (mL) 40 mL 01/13/2016  6:00 AM     Closed System  Drain 1 Anterior;Midline Abdomen Bulb (JP) (Active)  Site Description Unremarkable 01/13/2016  8:00 AM  Dressing Status Clean;Dry;Intact 01/13/2016  8:00 AM  Drainage Appearance Brown 01/13/2016  8:00 AM  Status To suction (Charged) 01/12/2016  8:00 PM  Output (mL) 5 mL 01/13/2016  6:00 AM     Urethral Catheter [name], MD Temperature probe 16 Fr. (Active)  Indication for Insertion or Continuance of Catheter Other (comment) 01/13/2016  7:30 AM  Site Assessment Clean;Intact 01/13/2016  8:00 AM  Catheter Maintenance Bag emptied prior to transport;No dependent loops;Seal intact;Bag below level of bladder;Catheter secured;Drainage bag/tubing not touching floor;Insertion date on drainage bag 01/13/2016  7:29 AM  Collection Container Standard drainage bag 01/13/2016  8:00 AM  Securement Method Securing device (Describe) 01/13/2016  8:00 AM  Urinary Catheter Interventions Irrigated 01/12/2016  8:00 AM  Input (mL) 60 mL 01/11/2016  8:00 PM  Output (mL) 50 mL 01/13/2016  6:00 AM    Microbiology/Sepsis markers: Results for orders placed or performed during the hospital encounter of 01/03/16  MRSA PCR Screening     Status: None   Collection Time: 01/04/16  2:48 AM  Result Value Ref Range Status   MRSA by PCR NEGATIVE NEGATIVE Final    Comment:        The GeneXpert MRSA Assay (FDA approved for NASAL specimens only), is one component of a comprehensive MRSA colonization surveillance program.  It is not intended to diagnose MRSA infection nor to guide or monitor treatment for MRSA infections.   Culture, respiratory (NON-Expectorated)     Status: None   Collection Time: 01/09/16 12:06 PM  Result Value Ref Range Status   Specimen Description TRACHEAL ASPIRATE  Final   Special Requests Normal  Final   Gram Stain   Final    MODERATE WBC PRESENT, PREDOMINANTLY PMN FEW GRAM VARIABLE ROD RARE GRAM POSITIVE COCCI IN PAIRS    Culture Consistent with normal respiratory flora.  Final   Report Status  01/11/2016 FINAL  Final    Anti-infectives:  Anti-infectives    Start     Dose/Rate Route Frequency Ordered Stop   01/09/16 2300  vancomycin (VANCOCIN) IVPB 750 mg/150 ml premix  Status:  Discontinued     750 mg 150 mL/hr over 60 Minutes Intravenous Every 12 hours 01/09/16 0936 01/09/16 1546   01/09/16 1200  piperacillin-tazobactam (ZOSYN) IVPB 4.5 g  Status:  Discontinued     4.5 g 200 mL/hr over 30 Minutes Intravenous Every 6 hours 01/09/16 0919 01/09/16 0921   01/09/16 1000  piperacillin-tazobactam (ZOSYN) IVPB 3.375 g     3.375 g 12.5 mL/hr over 240 Minutes Intravenous Every 8 hours 01/09/16 0923     01/09/16 1000  vancomycin (VANCOCIN) 1,500 mg in sodium chloride 0.9 % 500 mL IVPB     1,500 mg 250 mL/hr over 120 Minutes Intravenous  Once 01/09/16 0932 01/09/16 1203   01/09/16 0930  vancomycin (VANCOCIN) 1,500 mg in sodium chloride 0.9 % 500 mL IVPB  Status:  Discontinued     1,500 mg 250 mL/hr over 120 Minutes Intravenous Every 12 hours 01/09/16 0919 01/09/16 0921   01/03/16 2145  ceFAZolin (ANCEF) IVPB 1 g/50 mL premix  Status:  Discontinued     1 g 100 mL/hr over 30 Minutes Intravenous  Once 01/03/16 2132 01/03/16 2136   01/03/16 2145  ceFAZolin (ANCEF) IVPB 2g/100 mL premix     2 g 200 mL/hr over 30 Minutes Intravenous  Once 01/03/16 2136 01/03/16 2230      Best Practice/Protocols:  VTE Prophylaxis: Lovenox (prophylaxtic dose) and Mechanical GI Prophylaxis: Proton Pump Inhibitor Continous Sedation  Consults:      Events:  Subjective:    Overnight Issues: Patient has not gotten any better and probably worse.    Objective:  Vital signs for last 24 hours: Temp:  [98.6 F (37 C)-100.5 F (38.1 C)] 99.4 F (37.4 C) (06/25 0800) Pulse Rate:  [91-114] 100 (06/25 1000) Resp:  [23-42] 38 (06/25 1000) BP: (83-112)/(38-79) 85/46 mmHg (06/25 1000) SpO2:  [86 %-100 %] 91 % (06/25 1000) FiO2 (%):  [60 %-80 %] 80 % (06/25 1000) Weight:  [94.6 kg (208 lb 8.9 oz)] 94.6  kg (208 lb 8.9 oz) (06/25 0500)  Hemodynamic parameters for last 24 hours:    Intake/Output from previous day: 06/24 0701 - 06/25 0700 In: 5320 [I.V.:4780; NG/GT:390; IV Piggyback:150] Out: 1841 [Urine:1470; Drains:331; Chest Tube:40]  Intake/Output this shift: Total I/O In: 743.3 [I.V.:603.3; NG/GT:90; IV Piggyback:50] Out: -   Vent settings for last 24 hours: Vent Mode:  [-] PRVC FiO2 (%):  [60 %-80 %] 80 % Set Rate:  [35 bmp] 35 bmp Vt Set:  [470 mL] 470 mL PEEP:  [8 cmH20] 8 cmH20 Plateau Pressure:  [27 cmH20-37 cmH20] 34 cmH20  Physical Exam:  General: struggling on the ventilator Neuro: RASS -1 and max out on sedatives Resp: diminished breath sounds bilaterally, rhonchi bilaterally,  wheezes bilaterally and stiff on the ARDSnet protocol CVS: Sinus tachycardia GI: distended, hypoactive BS and tolerating tue feedings but may the the sorce of continued sepsis Extremities: edema 2+  Results for orders placed or performed during the hospital encounter of 01/03/16 (from the past 24 hour(s))  Glucose, capillary     Status: Abnormal   Collection Time: 01/12/16 12:06 PM  Result Value Ref Range   Glucose-Capillary 117 (H) 65 - 99 mg/dL  Blood gas, arterial     Status: Abnormal   Collection Time: 01/12/16  3:38 PM  Result Value Ref Range   FIO2 0.60    Delivery systems VENTILATOR    Mode PRVC    VT 470 mL   LHR 35 resp/min   Peep/cpap 8.0 cm H20   pH, Arterial 7.241 (L) 7.350 - 7.450   pCO2 arterial 56.7 (H) 35.0 - 45.0 mmHg   pO2, Arterial 66.0 (L) 80.0 - 100.0 mmHg   Bicarbonate 23.5 20.0 - 24.0 mEq/L   TCO2 25.2 0 - 100 mmol/L   Acid-base deficit 2.9 (H) 0.0 - 2.0 mmol/L   O2 Saturation 89.6 %   Patient temperature 98.6    Collection site RIGHT RADIAL    Drawn by [phone]    Sample type ARTERIAL DRAW    Allens test (pass/fail) PASS PASS  Glucose, capillary     Status: Abnormal   Collection Time: 01/12/16  4:23 PM  Result Value Ref Range   Glucose-Capillary 126 (H)  65 - 99 mg/dL  Glucose, capillary     Status: Abnormal   Collection Time: 01/12/16  7:15 PM  Result Value Ref Range   Glucose-Capillary 102 (H) 65 - 99 mg/dL  Glucose, capillary     Status: Abnormal   Collection Time: 01/12/16 11:43 PM  Result Value Ref Range   Glucose-Capillary 112 (H) 65 - 99 mg/dL  Glucose, capillary     Status: Abnormal   Collection Time: 01/13/16  3:37 AM  Result Value Ref Range   Glucose-Capillary 115 (H) 65 - 99 mg/dL  Blood gas, arterial     Status: Abnormal   Collection Time: 01/13/16  4:08 AM  Result Value Ref Range   FIO2 0.80    Delivery systems VENTILATOR    Mode PRESSURE REGULATED VOLUME CONTROL    VT 470 mL   LHR 35 resp/min   Peep/cpap 8.0 cm H20   pH, Arterial 7.201 (L) 7.350 - 7.450   pCO2 arterial 59.1 (HH) 35.0 - 45.0 mmHg   pO2, Arterial 71.4 (L) 80.0 - 100.0 mmHg   Bicarbonate 22.2 20.0 - 24.0 mEq/L   TCO2 24.1 0 - 100 mmol/L   Acid-base deficit 4.6 (H) 0.0 - 2.0 mmol/L   O2 Saturation 91.8 %   Patient temperature 98.6    Collection site RIGHT RADIAL    Drawn by [phone]    Sample type ARTERIAL DRAW    Allens test (pass/fail) PASS PASS  Glucose, capillary     Status: Abnormal   Collection Time: 01/13/16  6:59 AM  Result Value Ref Range   Glucose-Capillary 117 (H) 65 - 99 mg/dL     Assessment/Plan:   NEURO  Altered Mental Status:  agitation and sedation   Plan: Trying to keep sedated with multiple pressors  PULM  ARDS (non-pulmonary etiology likely unknown source of sepsis)   Plan: CT abdomen and pelvis with oral contrast only.  CARDIO  Sinus Tachycardia   Plan: No specific treatment  RENAL  Oliguria (low effective intravascular  volume and suspect ATN) Actue Renal Failure (acute tubular necrosis and due to hepatorenal syndrome)   Plan: Nephrology consultation requested.  May be in response to sepsis  GI  Bowel Trauma with resection, Hepatic Trauma and Penetrating Abdominal Trauma   Plan: CT abdomen with oral contrast only.   ID  No known source of infection or sepsis.   Plan: CT abdomen and pelvis with oral contrast  HEME  Anemia acute blood loss anemia and anemia of critical illness)   Plan: Hemoglobin down to 7.3.  No blood needs to be given now but possible in the near future   ENDO No known issues   Plan: CPM  Global Issues  Patient struggling with ARDS and renal insufficiency.  Good chance there is a continuing source of sepsis causing multiple organ failure.  Even though I would not like to re-explore the abdomen, that may be necessary if there is a source in the abdomen that needs to be controlled.    LOS: 9 days   Additional comments:I reviewed the patient's new clinical lab test results. cbc/bmet and I reviewed the patients new imaging test results. cxr  Critical Care Total Time*: 40 minutes of critical care management and evaluation.  Myah Guynes 01/13/2016  *Care during the described time interval was provided by me and/or other providers on the critical care team.  I have reviewed this patient's available data, including medical history, events of note, physical examination and test results as part of my evaluation.

## 2016-01-13 NOTE — Progress Notes (Signed)
Trauma MD notified of ABG results. No new orders at this time.

## 2016-01-13 NOTE — Progress Notes (Signed)
While suctioning pt, pt desat to 82%.  Pt placed on 100% fio2 breaths for 2 minutes x 2, sat returned to 92-93%. RN aware.

## 2016-01-13 NOTE — Progress Notes (Signed)
1655- I spoke w/ Dr Delton CoombesByrum- okay to go ahead and obtain ABG now.  1710- RN on phone- waiting on hold to give MD panic ABG results.
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
## 2016-01-13 NOTE — Progress Notes (Signed)
No source of sepsis noted in the abdomen on CT.    This patient has been seen and I agree with the findings and treatment plan.  Marta LamasJames O. Gae BonWyatt, III, MD, FACS [phone] (pager) [phone] (direct pager) Trauma Surgeon

## 2016-01-14 ENCOUNTER — Inpatient Hospital Stay (HOSPITAL_COMMUNITY): Payer: Medicaid Other

## 2016-01-14 DIAGNOSIS — J80 Acute respiratory distress syndrome: Secondary | ICD-10-CM | POA: Diagnosis not present

## 2016-01-14 DIAGNOSIS — Z789 Other specified health status: Secondary | ICD-10-CM

## 2016-01-14 DIAGNOSIS — D62 Acute posthemorrhagic anemia: Secondary | ICD-10-CM

## 2016-01-14 DIAGNOSIS — J9601 Acute respiratory failure with hypoxia: Secondary | ICD-10-CM

## 2016-01-14 DIAGNOSIS — W3400XA Accidental discharge from unspecified firearms or gun, initial encounter: Secondary | ICD-10-CM

## 2016-01-14 DIAGNOSIS — Z978 Presence of other specified devices: Secondary | ICD-10-CM | POA: Diagnosis present

## 2016-01-14 DIAGNOSIS — T148 Other injury of unspecified body region: Secondary | ICD-10-CM

## 2016-01-14 LAB — GLUCOSE, CAPILLARY
Glucose-Capillary: 114 mg/dL — ABNORMAL HIGH (ref 65–99)
Glucose-Capillary: 114 mg/dL — ABNORMAL HIGH (ref 65–99)
Glucose-Capillary: 133 mg/dL — ABNORMAL HIGH (ref 65–99)
Glucose-Capillary: 138 mg/dL — ABNORMAL HIGH (ref 65–99)
Glucose-Capillary: 123 mg/dL — ABNORMAL HIGH (ref 65–99)

## 2016-01-14 LAB — BLOOD GAS, ARTERIAL
Acid-base deficit: 4.9 mmol/L — ABNORMAL HIGH (ref 0.0–2.0)
Acid-base deficit: 5.3 mmol/L — ABNORMAL HIGH (ref 0.0–2.0)
Acid-base deficit: 5.4 mmol/L — ABNORMAL HIGH (ref 0.0–2.0)
Acid-base deficit: 5.9 mmol/L — ABNORMAL HIGH (ref 0.0–2.0)
Bicarbonate: 20.7 mEq/L (ref 20.0–24.0)
Bicarbonate: 21.6 mEq/L (ref 20.0–24.0)
Bicarbonate: 21.8 mEq/L (ref 20.0–24.0)
Bicarbonate: 22.3 mEq/L (ref 20.0–24.0)
Drawn by: 23604
Drawn by: 23604
Drawn by: 23604
Drawn by: 313941
FIO2: 0.8
FIO2: 0.8
FIO2: 0.8
FIO2: 0.8
MECHVT: 470 mL
MECHVT: 470 mL
MECHVT: 530 mL
MECHVT: 530 mL
O2 Saturation: 88.9 %
O2 Saturation: 91.1 %
O2 Saturation: 93.2 %
O2 Saturation: 94.7 %
PEEP: 12 cmH2O
PEEP: 12 cmH2O
PEEP: 12 cmH2O
PEEP: 14 cmH2O
Patient temperature: 101
Patient temperature: 101.9
Patient temperature: 97.7
Patient temperature: 98.7
RATE: 35 resp/min
RATE: 35 resp/min
RATE: 35 resp/min
RATE: 35 resp/min
TCO2: 22.1 mmol/L (ref 0–100)
TCO2: 23.4 mmol/L (ref 0–100)
TCO2: 23.8 mmol/L (ref 0–100)
TCO2: 24.2 mmol/L (ref 0–100)
pCO2 arterial: 48.1 mmHg — ABNORMAL HIGH (ref 35.0–45.0)
pCO2 arterial: 60.4 mmHg (ref 35.0–45.0)
pCO2 arterial: 62.2 mmHg (ref 35.0–45.0)
pCO2 arterial: 72 mmHg (ref 35.0–45.0)
pH, Arterial: 7.122 — CL (ref 7.350–7.450)
pH, Arterial: 7.176 — CL (ref 7.350–7.450)
pH, Arterial: 7.188 — CL (ref 7.350–7.450)
pH, Arterial: 7.257 — ABNORMAL LOW (ref 7.350–7.450)
pO2, Arterial: 65.4 mmHg — ABNORMAL LOW (ref 80.0–100.0)
pO2, Arterial: 78.4 mmHg — ABNORMAL LOW (ref 80.0–100.0)
pO2, Arterial: 79.4 mmHg — ABNORMAL LOW (ref 80.0–100.0)
pO2, Arterial: 81 mmHg (ref 80.0–100.0)

## 2016-01-14 LAB — RENAL FUNCTION PANEL
Albumin: 1.1 g/dL — ABNORMAL LOW (ref 3.5–5.0)
Anion gap: 9 (ref 5–15)
BUN: 102 mg/dL — ABNORMAL HIGH (ref 6–20)
CO2: 23 mmol/L (ref 22–32)
Calcium: 7.7 mg/dL — ABNORMAL LOW (ref 8.9–10.3)
Chloride: 107 mmol/L (ref 101–111)
Creatinine, Ser: 7.72 mg/dL — ABNORMAL HIGH (ref 0.61–1.24)
GFR calc Af Amer: 10 mL/min — ABNORMAL LOW (ref >60)
GFR calc non Af Amer: 9 mL/min — ABNORMAL LOW (ref >60)
Glucose, Bld: 132 mg/dL — ABNORMAL HIGH (ref 65–99)
Phosphorus: 9.5 mg/dL — ABNORMAL HIGH (ref 2.5–4.6)
Potassium: 5 mmol/L (ref 3.5–5.1)
Sodium: 139 mmol/L (ref 135–145)

## 2016-01-14 LAB — COMPREHENSIVE METABOLIC PANEL
ALT: 73 U/L — ABNORMAL HIGH (ref 17–63)
AST: 46 U/L — ABNORMAL HIGH (ref 15–41)
Albumin: 1.1 g/dL — ABNORMAL LOW (ref 3.5–5.0)
Alkaline Phosphatase: 60 U/L (ref 38–126)
Anion gap: 7 (ref 5–15)
BUN: 92 mg/dL — ABNORMAL HIGH (ref 6–20)
CO2: 22 mmol/L (ref 22–32)
Calcium: 7.7 mg/dL — ABNORMAL LOW (ref 8.9–10.3)
Chloride: 110 mmol/L (ref 101–111)
Creatinine, Ser: 7.05 mg/dL — ABNORMAL HIGH (ref 0.61–1.24)
GFR calc Af Amer: 12 mL/min — ABNORMAL LOW (ref >60)
GFR calc non Af Amer: 10 mL/min — ABNORMAL LOW (ref >60)
Glucose, Bld: 124 mg/dL — ABNORMAL HIGH (ref 65–99)
Potassium: 5.3 mmol/L — ABNORMAL HIGH (ref 3.5–5.1)
Sodium: 139 mmol/L (ref 135–145)
Total Bilirubin: 0.7 mg/dL (ref 0.3–1.2)
Total Protein: 5.6 g/dL — ABNORMAL LOW (ref 6.5–8.1)

## 2016-01-14 LAB — CBC
HCT: 20.4 % — ABNORMAL LOW (ref 39.0–52.0)
Hemoglobin: 6.3 g/dL — CL (ref 13.0–17.0)
MCH: 29.5 pg (ref 26.0–34.0)
MCHC: 31.9 g/dL (ref 30.0–36.0)
MCV: 92.7 fL (ref 78.0–100.0)
Platelets: 184 10*3/uL (ref 150–400)
RBC: 2.2 MIL/uL — ABNORMAL LOW (ref 4.22–5.81)
RDW: 15.9 % — ABNORMAL HIGH (ref 11.5–15.5)
WBC: 25.1 10*3/uL — ABNORMAL HIGH (ref 4.0–10.5)

## 2016-01-14 LAB — POCT I-STAT 3, ART BLOOD GAS (G3+)
Acid-base deficit: 4 mmol/L — ABNORMAL HIGH (ref 0.0–2.0)
Bicarbonate: 24.4 mEq/L — ABNORMAL HIGH (ref 20.0–24.0)
O2 Saturation: 91 %
Patient temperature: 95.9
TCO2: 26 mmol/L (ref 0–100)
pCO2 arterial: 57.9 mmHg (ref 35.0–45.0)
pH, Arterial: 7.224 — ABNORMAL LOW (ref 7.350–7.450)
pO2, Arterial: 67 mmHg — ABNORMAL LOW (ref 80.0–100.0)

## 2016-01-14 LAB — MAGNESIUM: Magnesium: 2.7 mg/dL — ABNORMAL HIGH (ref 1.7–2.4)

## 2016-01-14 LAB — PREPARE RBC (CROSSMATCH)

## 2016-01-14 LAB — PHOSPHORUS: Phosphorus: 7.5 mg/dL — ABNORMAL HIGH (ref 2.5–4.6)

## 2016-01-14 MED ORDER — SODIUM POLYSTYRENE SULFONATE 15 GM/60ML PO SUSP
30.0000 g | Freq: Once | ORAL | Status: AC
  Administered 2016-01-14: 30 g
  Filled 2016-01-14: qty 120

## 2016-01-14 MED ORDER — SODIUM CHLORIDE 0.9 % IV SOLN
Freq: Once | INTRAVENOUS | Status: AC
  Administered 2016-01-14: 06:00:00 via INTRAVENOUS

## 2016-01-14 MED ORDER — PRISMASOL BGK 0/2.5 32-2.5 MEQ/L IV SOLN
INTRAVENOUS | Status: DC
  Administered 2016-01-14 – 2016-01-18 (×4): via INTRAVENOUS_CENTRAL
  Filled 2016-01-14 (×15): qty 5000

## 2016-01-14 MED ORDER — PIPERACILLIN-TAZOBACTAM 3.375 G IVPB
3.3750 g | Freq: Four times a day (QID) | INTRAVENOUS | Status: DC
Start: 2016-01-14 — End: 2016-01-15
  Administered 2016-01-14 – 2016-01-15 (×3): 3.375 g via INTRAVENOUS
  Filled 2016-01-14 (×6): qty 50

## 2016-01-14 MED ORDER — PRISMASOL BGK 0/2.5 32-2.5 MEQ/L IV SOLN
INTRAVENOUS | Status: DC
  Administered 2016-01-14 – 2016-01-18 (×6): via INTRAVENOUS_CENTRAL
  Filled 2016-01-14 (×17): qty 5000

## 2016-01-14 MED ORDER — IPRATROPIUM-ALBUTEROL 0.5-2.5 (3) MG/3ML IN SOLN
3.0000 mL | RESPIRATORY_TRACT | Status: DC
  Administered 2016-01-14 – 2016-01-19 (×33): 3 mL via RESPIRATORY_TRACT
  Filled 2016-01-14 (×32): qty 3

## 2016-01-14 MED ORDER — ROCURONIUM BROMIDE 50 MG/5ML IV SOLN
66.0000 mg | Freq: Once | INTRAVENOUS | Status: AC
  Administered 2016-01-14: 66 mg via INTRAVENOUS
  Filled 2016-01-14: qty 6.6

## 2016-01-14 MED ORDER — HEPARIN SODIUM (PORCINE) 1000 UNIT/ML DIALYSIS
1000.0000 [IU] | INTRAMUSCULAR | Status: DC | PRN
  Filled 2016-01-14: qty 6

## 2016-01-14 MED ORDER — SODIUM CHLORIDE 0.9 % FOR CRRT
INTRAVENOUS_CENTRAL | Status: DC | PRN
  Filled 2016-01-14: qty 1000

## 2016-01-14 MED ORDER — PRISMASOL BGK 0/2.5 32-2.5 MEQ/L IV SOLN
INTRAVENOUS | Status: DC
  Administered 2016-01-14 – 2016-01-18 (×22): via INTRAVENOUS_CENTRAL
  Filled 2016-01-14 (×46): qty 5000

## 2016-01-14 MED ORDER — IPRATROPIUM-ALBUTEROL 0.5-2.5 (3) MG/3ML IN SOLN
RESPIRATORY_TRACT | Status: AC
  Filled 2016-01-14: qty 3

## 2016-01-14 NOTE — Progress Notes (Signed)
eLink Physician-Brief Progress Note Patient Name: Samuel PaganShiquan Owens DOB: 1996/03/07 MRN: [id number]   Date of Service  01/14/2016  HPI/Events of Note  ?pH 7.188, pCO2 60.4, pO2 65, bicarb 22.3 On 7cc/kg, bicarb at 100cc/h. I am willing to tolerate pH 7/19 at this time, anticipate that CVVHD will help in this regard. Continue current bicarb rate and recheck ABG several h after CVVHD initiated.   eICU Interventions       Intervention Category Major Interventions: Respiratory failure - evaluation and management  Amiya Escamilla S. 01/14/2016, 4:53 PM
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
## 2016-01-14 NOTE — Progress Notes (Signed)
eLink Physician-Brief Progress Note Patient Name: Samuel Owens DOB: 1996-01-21 MRN: [id number]   Date of Service  01/14/2016  HPI/Events of Note  PPeak ~ 50, Pplat in the 40's He was changed to 8cc/kg overnight and bicarb added due to pH 7.15. Note that CVVHD about to start. CXR with no change in B infiltrates. R chest tube is kinked but no apparent PTX post HD cath placement  eICU Interventions  - will decrease to 7cc/kg - continue current bicarb and possibly increase based on n4xt ABG - consider lengthening I time, although likely not able given his high RR - hopefully CVVHD will also help with pH     Intervention Category Major Interventions: Respiratory failure - evaluation and management  [name] S. 01/14/2016, 3:42 PM

## 2016-01-14 NOTE — Progress Notes (Signed)
eLink Physician-Brief Progress Note Patient Name: Samuel Owens DOB: [birth year] MRN: [id number]   Date of Service  01/14/2016  HPI/Events of Note  AM labs show anemia with hemoglobin 6.3. Hypercarbia significantly improved. Last type and screen a.m. 6/23. Peak pressure approximately 45 on recheck. Nurse reports 1/4 twitches on paralytic.   eICU Interventions  1. Stat type and screen 2. Transfuse 1 unit packed red blood cell 3. Continue weaning FiO2 for saturation greater than 94%      Intervention Category Major Interventions: Other:;Respiratory failure - evaluation and management  Lawanda CousinsJennings Avante Carneiro 01/14/2016, 4:59 AM

## 2016-01-14 NOTE — Progress Notes (Signed)
Pharmacy Antibiotic Note  Janit PaganShiquan XXXmoore is a 20 y.o. male admitted on 01/03/2016 with sepsis of unknown source.  Pharmacy has been consulted for renally adjusting medications for CRRT. Day #6 of abx for possible PNA, source unknown. Tmax of 101, WBC elevated at 15, Resp cx: neg. Pt continuing on Zosyn.  Plan: Adjust Zosyn to 3.375g IV q6h for CRRT dosing Monitor clinical picture, renal function F/U C&S, abx deescalation / LOT  Height: 5\' 7"  (170.2 cm) Weight: 224 lb 6.9 oz (101.8 kg) IBW/kg (Calculated) : 66.1  Temp (24hrs), Avg:99.2 F (37.3 C), Min:97.5 F (36.4 C), Max:101.7 F (38.7 C)   Recent Labs Lab 01/09/16 0320 01/10/16 [id number] 01/11/16 0537 01/12/16 0519 01/14/16 0356  WBC 14.3* 15.8* 15.1* 15.6* 25.1*  CREATININE 2.02* 2.91* 2.79* 3.38* 7.05*    Estimated Creatinine Clearance: 18.8 mL/min (by C-G formula based on Cr of 7.05).    No Known Allergies  Babs BertinHaley Jerita Wimbush, PharmD, [facility] Clinical Pharmacist Pager [phone] 01/14/2016 1:58 PM
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
## 2016-01-14 NOTE — Progress Notes (Signed)
CRITICAL VALUE ALERT  Critical value received:  PH 7.18, CO2 60.4, PO2 65  Date of notification: 01/14/16  Time of notification:  1700  Critical value read back:Yes.    Nurse who received alert:  Verlin DikeKimberly Flint Hakeem   MD notified (1st page):  Dr. Delton CoombesByrum  Time of first page:  1705  MD notified (2nd page):  Time of second page:  Responding MD: Dr. Delton CoombesByrum  Time MD responded:  [phone]

## 2016-01-14 NOTE — Procedures (Signed)
Hemodialysis Catheter Insertion Procedure Note Janit PaganShiquan XXXmoore [id number] March 26, 1996  Procedure: Insertion of Hemodialysis Catheter Indications: Hemodialysis  Procedure Details Consent: Risks of procedure as well as the alternatives and risks of each were explained to the (patient/caregiver).  Consent for procedure obtained.  Time Out: Verified patient identification, verified procedure, site/side was marked, verified correct patient position, special equipment/implants available, medications/allergies/relevent history reviewed, required imaging and test results available.  Performed  Maximum sterile technique was used including antiseptics, cap, gloves, gown, hand hygiene, mask and sheet.  Skin prep: Chlorhexidine; local anesthetic administered  A Trialysis HD catheter was placed in the right internal jugular vein using the Seldinger technique.  Evaluation Blood flow good Complications: No apparent complications Patient did tolerate procedure well. Chest X-ray ordered to verify placement.  CXR: pending.   Procedure performed with ultrasound guidance for real time vessel cannulation.      db  Canary BrimBrandi Dietra Stokely, NP-C [facility] Pgr: [phone] or [phone] 01/14/2016, 2:29 PM
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
## 2016-01-14 NOTE — Progress Notes (Signed)
eLink Physician-Brief Progress Note Patient Name: Samuel PaganShiquan Owens DOB: 31-Mar-1996 MRN: [id number]   Date of Service  01/14/2016  HPI/Events of Note  Camera check on patient shows still with peak pressures in the upper 40s. Repeat ABG pH 7.176/PCO2 62/PO2 81. Nurse reports patient still having 4/4 twitches on maximum Nimbex infusion.   eICU Interventions  1. Rocuronium bolus 1 mg/kg ideal body weight now 2. Continue current ventilator settings 3. Repeat ABG at 0500 hrs.      Intervention Category Major Interventions: Respiratory failure - evaluation and management;Acid-Base disturbance - evaluation and management  [name] 01/14/2016, 12:58 AM

## 2016-01-15 ENCOUNTER — Inpatient Hospital Stay (HOSPITAL_COMMUNITY): Payer: Medicaid Other

## 2016-01-15 LAB — BLOOD GAS, ARTERIAL
Acid-base deficit: 1.1 mmol/L (ref 0.0–2.0)
Bicarbonate: 25.3 mEq/L — ABNORMAL HIGH (ref 20.0–24.0)
Drawn by: 419771
FIO2: 0.6
MECHVT: 470 mL
O2 Saturation: 91.4 %
PEEP: 12 cmH2O
Patient temperature: 98.6
RATE: 12 resp/min
TCO2: 27.1 mmol/L (ref 0–100)
pCO2 arterial: 59.6 mmHg (ref 35.0–45.0)
pH, Arterial: 7.25 — ABNORMAL LOW (ref 7.350–7.450)
pO2, Arterial: 68.8 mmHg — ABNORMAL LOW (ref 80.0–100.0)

## 2016-01-15 LAB — CBC WITH DIFFERENTIAL/PLATELET
Basophils Absolute: 0 10*3/uL (ref 0.0–0.1)
Basophils Relative: 0 %
Eosinophils Absolute: 0.2 10*3/uL (ref 0.0–0.7)
Eosinophils Relative: 1 %
HCT: 21.4 % — ABNORMAL LOW (ref 39.0–52.0)
Hemoglobin: 7 g/dL — ABNORMAL LOW (ref 13.0–17.0)
Lymphocytes Relative: 3 %
Lymphs Abs: 0.7 10*3/uL (ref 0.7–4.0)
MCH: 29.3 pg (ref 26.0–34.0)
MCHC: 32.7 g/dL (ref 30.0–36.0)
MCV: 89.5 fL (ref 78.0–100.0)
Monocytes Absolute: 1.9 10*3/uL — ABNORMAL HIGH (ref 0.1–1.0)
Monocytes Relative: 8 %
Neutro Abs: 20.4 10*3/uL — ABNORMAL HIGH (ref 1.7–7.7)
Neutrophils Relative %: 88 %
Platelets: 205 10*3/uL (ref 150–400)
RBC: 2.39 MIL/uL — ABNORMAL LOW (ref 4.22–5.81)
RDW: 15.6 % — ABNORMAL HIGH (ref 11.5–15.5)
WBC: 23.2 10*3/uL — ABNORMAL HIGH (ref 4.0–10.5)

## 2016-01-15 LAB — TYPE AND SCREEN
ABO/RH(D): AB POS
Antibody Screen: POSITIVE
DAT, IgG: NEGATIVE
DAT, complement: NEGATIVE
Donor AG Type: NEGATIVE
Unit division: 0
Unit division: 0

## 2016-01-15 LAB — GLUCOSE, CAPILLARY
Glucose-Capillary: 112 mg/dL — ABNORMAL HIGH (ref 65–99)
Glucose-Capillary: 115 mg/dL — ABNORMAL HIGH (ref 65–99)
Glucose-Capillary: 116 mg/dL — ABNORMAL HIGH (ref 65–99)
Glucose-Capillary: 117 mg/dL — ABNORMAL HIGH (ref 65–99)
Glucose-Capillary: 134 mg/dL — ABNORMAL HIGH (ref 65–99)
Glucose-Capillary: 140 mg/dL — ABNORMAL HIGH (ref 65–99)

## 2016-01-15 LAB — MAGNESIUM: Magnesium: 2.6 mg/dL — ABNORMAL HIGH (ref 1.7–2.4)

## 2016-01-15 LAB — POCT ACTIVATED CLOTTING TIME
Activated Clotting Time: 169 seconds
Activated Clotting Time: 164 seconds
Activated Clotting Time: 169 seconds
Activated Clotting Time: 169 seconds
Activated Clotting Time: 169 seconds
Activated Clotting Time: 169 seconds
Activated Clotting Time: 169 seconds
Activated Clotting Time: 164 seconds
Activated Clotting Time: 169 seconds

## 2016-01-15 LAB — BASIC METABOLIC PANEL
Anion gap: 11 (ref 5–15)
BUN: 76 mg/dL — ABNORMAL HIGH (ref 6–20)
CO2: 27 mmol/L (ref 22–32)
Calcium: 7.6 mg/dL — ABNORMAL LOW (ref 8.9–10.3)
Chloride: 99 mmol/L — ABNORMAL LOW (ref 101–111)
Creatinine, Ser: 6 mg/dL — ABNORMAL HIGH (ref 0.61–1.24)
GFR calc Af Amer: 14 mL/min — ABNORMAL LOW (ref >60)
GFR calc non Af Amer: 12 mL/min — ABNORMAL LOW (ref >60)
Glucose, Bld: 119 mg/dL — ABNORMAL HIGH (ref 65–99)
Potassium: 4.6 mmol/L (ref 3.5–5.1)
Sodium: 137 mmol/L (ref 135–145)

## 2016-01-15 LAB — PREPARE RBC (CROSSMATCH)

## 2016-01-15 LAB — RENAL FUNCTION PANEL
Albumin: 1.1 g/dL — ABNORMAL LOW (ref 3.5–5.0)
Albumin: 1.1 g/dL — ABNORMAL LOW (ref 3.5–5.0)
Anion gap: 12 (ref 5–15)
Anion gap: 9 (ref 5–15)
BUN: 79 mg/dL — ABNORMAL HIGH (ref 6–20)
BUN: 81 mg/dL — ABNORMAL HIGH (ref 6–20)
CO2: 26 mmol/L (ref 22–32)
CO2: 27 mmol/L (ref 22–32)
Calcium: 7.6 mg/dL — ABNORMAL LOW (ref 8.9–10.3)
Calcium: 7.3 mg/dL — ABNORMAL LOW (ref 8.9–10.3)
Chloride: 100 mmol/L — ABNORMAL LOW (ref 101–111)
Chloride: 100 mmol/L — ABNORMAL LOW (ref 101–111)
Creatinine, Ser: 5.95 mg/dL — ABNORMAL HIGH (ref 0.61–1.24)
Creatinine, Ser: 5.97 mg/dL — ABNORMAL HIGH (ref 0.61–1.24)
GFR calc Af Amer: 14 mL/min — ABNORMAL LOW (ref >60)
GFR calc Af Amer: 14 mL/min — ABNORMAL LOW (ref >60)
GFR calc non Af Amer: 12 mL/min — ABNORMAL LOW (ref >60)
GFR calc non Af Amer: 12 mL/min — ABNORMAL LOW (ref >60)
Glucose, Bld: 122 mg/dL — ABNORMAL HIGH (ref 65–99)
Glucose, Bld: 134 mg/dL — ABNORMAL HIGH (ref 65–99)
Phosphorus: 7 mg/dL — ABNORMAL HIGH (ref 2.5–4.6)
Phosphorus: 6.5 mg/dL — ABNORMAL HIGH (ref 2.5–4.6)
Potassium: 4.6 mmol/L (ref 3.5–5.1)
Potassium: 4.5 mmol/L (ref 3.5–5.1)
Sodium: 138 mmol/L (ref 135–145)
Sodium: 136 mmol/L (ref 135–145)

## 2016-01-15 MED ORDER — ANTICOAGULANT SODIUM CITRATE 4% (200MG/5ML) IV SOLN
Status: DC
  Filled 2016-01-15 (×3): qty 1500

## 2016-01-15 MED ORDER — HEPARIN SODIUM (PORCINE) 5000 UNIT/ML IJ SOLN
250.0000 [IU]/h | INTRAMUSCULAR | Status: DC
  Administered 2016-01-15: 750 [IU]/h via INTRAVENOUS_CENTRAL
  Administered 2016-01-15: 550 [IU]/h via INTRAVENOUS_CENTRAL
  Administered 2016-01-15: 250 [IU]/h via INTRAVENOUS_CENTRAL
  Administered 2016-01-15: 950 [IU]/h via INTRAVENOUS_CENTRAL
  Administered 2016-01-16: 1250 [IU]/h via INTRAVENOUS_CENTRAL
  Administered 2016-01-16 (×2): 1600 [IU]/h via INTRAVENOUS_CENTRAL
  Administered 2016-01-16: 1000 [IU]/h via INTRAVENOUS_CENTRAL
  Administered 2016-01-16: 1400 [IU]/h via INTRAVENOUS_CENTRAL
  Administered 2016-01-16 (×2): 1900 [IU]/h via INTRAVENOUS_CENTRAL
  Administered 2016-01-16 – 2016-01-18 (×7): 2200 [IU]/h via INTRAVENOUS_CENTRAL
  Administered 2016-01-18 (×2): 2100 [IU]/h via INTRAVENOUS_CENTRAL
  Administered 2016-01-18 (×3): 2200 [IU]/h via INTRAVENOUS_CENTRAL
  Administered 2016-01-19 – 2016-01-20 (×9): 2100 [IU]/h via INTRAVENOUS_CENTRAL
  Administered 2016-01-20 – 2016-01-21 (×4): 2050 [IU]/h via INTRAVENOUS_CENTRAL
  Administered 2016-01-21: 2150 [IU]/h via INTRAVENOUS_CENTRAL
  Administered 2016-01-21 (×2): 2050 [IU]/h via INTRAVENOUS_CENTRAL
  Administered 2016-01-21 – 2016-01-22 (×2): 2250 [IU]/h via INTRAVENOUS_CENTRAL
  Administered 2016-01-22: 2300 [IU]/h via INTRAVENOUS_CENTRAL
  Administered 2016-01-22: 2250 [IU]/h via INTRAVENOUS_CENTRAL
  Administered 2016-01-22: 2300 [IU]/h via INTRAVENOUS_CENTRAL
  Administered 2016-01-22: 2250 [IU]/h via INTRAVENOUS_CENTRAL
  Administered 2016-01-23 (×6): 2300 [IU]/h via INTRAVENOUS_CENTRAL
  Administered 2016-01-24: 2200 [IU]/h via INTRAVENOUS_CENTRAL
  Administered 2016-01-24: 2250 [IU]/h via INTRAVENOUS_CENTRAL
  Administered 2016-01-24: 2150 [IU]/h via INTRAVENOUS_CENTRAL
  Administered 2016-01-24: 2300 [IU]/h via INTRAVENOUS_CENTRAL
  Administered 2016-01-24: 2250 [IU]/h via INTRAVENOUS_CENTRAL
  Filled 2016-01-15 (×52): qty 2

## 2016-01-15 MED ORDER — HEPARIN BOLUS VIA INFUSION (CRRT)
1000.0000 [IU] | INTRAVENOUS | Status: DC | PRN
  Filled 2016-01-15: qty 1000

## 2016-01-15 MED ORDER — HEPARIN SODIUM (PORCINE) 1000 UNIT/ML DIALYSIS
1000.0000 [IU] | INTRAMUSCULAR | Status: DC | PRN
  Administered 2016-01-24: 2400 [IU] via INTRAVENOUS_CENTRAL
  Filled 2016-01-15: qty 6

## 2016-01-15 MED ORDER — SODIUM CHLORIDE 0.9 % IV SOLN: INTRAVENOUS | Status: DC | PRN

## 2016-01-15 MED ORDER — SODIUM CHLORIDE 0.9 % IV SOLN: Freq: Once | INTRAVENOUS | Status: AC

## 2016-01-15 MED ORDER — SODIUM CHLORIDE 0.9 % IV SOLN
0.5000 mg/kg/h | INTRAVENOUS | Status: DC
  Filled 2016-01-15: qty 100

## 2016-01-15 NOTE — Progress Notes (Signed)
Follow up - Trauma and Critical Care  Patient Details:    Samuel Owens is an 20 y.o. male.  Lines/tubes : Airway 8 mm (Active)  Secured at (cm) 25 cm 01/15/2016  9:23 AM  Measured From Lips 01/15/2016  9:23 AM  Secured Location Left 01/15/2016  9:23 AM  Secured By [employer] 01/15/2016  9:23 AM  Tube Holder Repositioned Yes 01/15/2016  9:23 AM  Cuff Pressure (cm H2O) 24 cm H2O 01/15/2016  3:28 AM  Site Condition Dry 01/15/2016  9:23 AM     PICC Triple Lumen 01/11/16 PICC Left Basilic 41 cm 0 cm (Active)  Indication for Insertion or Continuance of Line Prolonged intravenous therapies;Administration of hyperosmolar/irritating solutions (i.e. TPN, Vancomycin, etc.) 01/15/2016  8:00 AM  Exposed Catheter (cm) 0 cm 01/11/2016  2:00 PM  Site Assessment Clean;Dry;Intact 01/15/2016  8:00 AM  Lumen #1 Status Infusing 01/15/2016  8:00 AM  Lumen #2 Status Infusing 01/15/2016  8:00 AM  Lumen #3 Status Infusing 01/15/2016  8:00 AM  Dressing Type Transparent 01/15/2016  8:00 AM  Dressing Status Clean;Dry;Intact;Antimicrobial disc in place 01/15/2016  8:00 AM  Line Care Connections checked and tightened 01/15/2016  8:00 AM  Dressing Change Due 01/18/16 01/15/2016  8:00 AM     Chest Tube 1 Right (Active)  Suction -20 cm H2O 01/15/2016  8:00 AM  Chest Tube Air Leak None 01/15/2016  8:00 AM  Patency Intervention Milked 01/12/2016  8:00 PM  Drainage Description Dark red 01/15/2016  8:00 AM  Dressing Status Dry;Old drainage 01/15/2016  8:00 AM  Dressing Intervention New dressing 01/15/2016  8:00 AM  Site Assessment Dry;Intact 01/15/2016  8:00 AM  Surrounding Skin Unable to view 01/12/2016  8:00 PM  Output (mL) 30 mL 01/15/2016  6:00 AM     Closed System Drain 2 Right;Lateral Abdomen Bulb (JP) 19 Fr. (Active)  Site Description Unremarkable 01/15/2016  8:00 AM  Dressing Status Clean;Dry;Intact;Other (Comment) 01/15/2016  8:00 AM  Drainage Appearance Brown 01/15/2016  8:00 AM  Status To suction (Charged)  01/15/2016  8:00 AM  Output (mL) 40 mL 01/15/2016  4:00 AM     Closed System Drain 1 Anterior;Midline Abdomen Bulb (JP) (Active)  Site Description Unremarkable 01/15/2016  8:00 AM  Dressing Status Clean;Dry;Intact;Other (Comment) 01/15/2016  8:00 AM  Drainage Appearance Brown 01/15/2016  8:00 AM  Status To suction (Charged) 01/15/2016  8:00 AM  Output (mL) 10 mL 01/15/2016  4:00 AM     Urethral Catheter [name], MD Temperature probe 16 Fr. (Active)  Indication for Insertion or Continuance of Catheter Chemically paralyzed patients 01/15/2016  8:00 AM  Site Assessment Clean;Intact 01/15/2016  8:00 AM  Catheter Maintenance Seal intact;Bag below level of bladder;Catheter secured;Bag emptied prior to transport;Drainage bag/tubing not touching floor;Insertion date on drainage bag 01/15/2016  8:00 AM  Collection Container Standard drainage bag 01/15/2016  8:00 AM  Securement Method Securing device (Describe) 01/15/2016  8:00 AM  Urinary Catheter Interventions Unclamped 01/15/2016  8:00 AM  Input (mL) 25 mL 01/15/2016  1:00 AM  Output (mL) 11 mL 01/15/2016  7:00 AM    Microbiology/Sepsis markers: Results for orders placed or performed during the hospital encounter of 01/03/16  MRSA PCR Screening     Status: None   Collection Time: 01/04/16  2:48 AM  Result Value Ref Range Status   MRSA by PCR NEGATIVE NEGATIVE Final    Comment:        The GeneXpert MRSA Assay (FDA approved for NASAL specimens only), is  one component of a comprehensive MRSA colonization surveillance program. It is not intended to diagnose MRSA infection nor to guide or monitor treatment for MRSA infections.   Culture, respiratory (NON-Expectorated)     Status: None   Collection Time: 01/09/16 12:06 PM  Result Value Ref Range Status   Specimen Description TRACHEAL ASPIRATE  Final   Special Requests Normal  Final   Gram Stain   Final    MODERATE WBC PRESENT, PREDOMINANTLY PMN FEW GRAM VARIABLE ROD RARE GRAM POSITIVE COCCI IN  PAIRS    Culture Consistent with normal respiratory flora.  Final   Report Status 01/11/2016 FINAL  Final    Anti-infectives:  Anti-infectives    Start     Dose/Rate Route Frequency Ordered Stop   01/14/16 1700  piperacillin-tazobactam (ZOSYN) IVPB 3.375 g     3.375 g 100 mL/hr over 30 Minutes Intravenous Every 6 hours 01/14/16 1400     01/09/16 2300  vancomycin (VANCOCIN) IVPB 750 mg/150 ml premix  Status:  Discontinued     750 mg 150 mL/hr over 60 Minutes Intravenous Every 12 hours 01/09/16 0936 01/09/16 1546   01/09/16 1200  piperacillin-tazobactam (ZOSYN) IVPB 4.5 g  Status:  Discontinued     4.5 g 200 mL/hr over 30 Minutes Intravenous Every 6 hours 01/09/16 0919 01/09/16 0921   01/09/16 1000  piperacillin-tazobactam (ZOSYN) IVPB 3.375 g  Status:  Discontinued     3.375 g 12.5 mL/hr over 240 Minutes Intravenous Every 8 hours 01/09/16 0923 01/14/16 1400   01/09/16 1000  vancomycin (VANCOCIN) 1,500 mg in sodium chloride 0.9 % 500 mL IVPB     1,500 mg 250 mL/hr over 120 Minutes Intravenous  Once 01/09/16 0932 01/09/16 1203   01/09/16 0930  vancomycin (VANCOCIN) 1,500 mg in sodium chloride 0.9 % 500 mL IVPB  Status:  Discontinued     1,500 mg 250 mL/hr over 120 Minutes Intravenous Every 12 hours 01/09/16 0919 01/09/16 0921   01/03/16 2145  ceFAZolin (ANCEF) IVPB 1 g/50 mL premix  Status:  Discontinued     1 g 100 mL/hr over 30 Minutes Intravenous  Once 01/03/16 2132 01/03/16 2136   01/03/16 2145  ceFAZolin (ANCEF) IVPB 2g/100 mL premix     2 g 200 mL/hr over 30 Minutes Intravenous  Once 01/03/16 2136 01/03/16 2230      Best Practice/Protocols:  VTE Prophylaxis: Mechanical GI Prophylaxis: Proton Pump Inhibitor Continous Sedation and paralytic  Consults: Treatment Team:  Lauris PoagAlvin C Powell, MD    Events:  Subjective:    Overnight Issues: The same.  On CRT, but filter clotted this AM.  Will use heparin.  Objective:  Vital signs for last 24 hours: Temp:  [95.9 F (35.5  C)-97.7 F (36.5 C)] 97.4 F (36.3 C) (06/27 0800) Pulse Rate:  [108-139] 127 (06/27 1000) Resp:  [0-35] 35 (06/27 1000) BP: (122-159)/(55-134) 156/83 mmHg (06/27 1000) SpO2:  [88 %-100 %] 92 % (06/27 1000) FiO2 (%):  [60 %-80 %] 60 % (06/27 1000) Weight:  [106.5 kg (234 lb 12.6 oz)] 106.5 kg (234 lb 12.6 oz) (06/27 0407)  Hemodynamic parameters for last 24 hours:    Intake/Output from previous day: 06/26 0701 - 06/27 0700 In: 8216.4 [I.V.:7046.4; Blood:380; NG/GT:540; IV Piggyback:200] Out: 3743 [Urine:463; Drains:180; Blood:150; Chest Tube:30]  Intake/Output this shift: Total I/O In: 1015.4 [I.V.:925.4; NG/GT:90] Out: -   Vent settings for last 24 hours: Vent Mode:  [-] PRVC FiO2 (%):  [60 %-80 %] 60 % Set Rate:  [35  bmp] 35 bmp Vt Set:  [470 mL-530 mL] 470 mL PEEP:  [10 cmH20-12 cmH20] 12 cmH20 Plateau Pressure:  [38 cmH20-44 cmH20] 40 cmH20  Physical Exam:  General: no respiratory distress and paralyzed and sedated Neuro: RASS -3 or deeper and paralyzed and sedated Resp: diminished breath sounds bibasilar, rhonchi LLL and wheezes LLL CVS: sinus tachycardia GI: hypoactive BS, wound clean and drain output is dropping.  tolerating tube feedings well. Extremities: edema 3+  Results for orders placed or performed during the hospital encounter of 01/03/16 (from the past 24 hour(s))  Glucose, capillary     Status: Abnormal   Collection Time: 01/14/16 11:26 AM  Result Value Ref Range   Glucose-Capillary 133 (H) 65 - 99 mg/dL   Comment 1 Notify RN    Comment 2 Document in Chart   Glucose, capillary     Status: Abnormal   Collection Time: 01/14/16  3:47 PM  Result Value Ref Range   Glucose-Capillary 138 (H) 65 - 99 mg/dL   Comment 1 Notify RN    Comment 2 Document in Chart   Blood gas, arterial     Status: Abnormal   Collection Time: 01/14/16  4:20 PM  Result Value Ref Range   FIO2 0.80    Delivery systems VENTILATOR    Mode PRVC    VT 470 mL   LHR 35 resp/min    Peep/cpap 12.0 cm H20   pH, Arterial 7.188 (LL) 7.350 - 7.450   pCO2 arterial 60.4 (HH) 35.0 - 45.0 mmHg   pO2, Arterial 65.4 (L) 80.0 - 100.0 mmHg   Bicarbonate 22.3 20.0 - 24.0 mEq/L   TCO2 24.2 0 - 100 mmol/L   Acid-base deficit 4.9 (H) 0.0 - 2.0 mmol/L   O2 Saturation 88.9 %   Patient temperature 97.7    Collection site RIGHT RADIAL    Drawn by [phone]    Sample type ARTERIAL DRAW    Allens test (pass/fail) PASS PASS  Renal function panel (daily at 1600)     Status: Abnormal   Collection Time: 01/14/16  4:40 PM  Result Value Ref Range   Sodium 139 135 - 145 mmol/L   Potassium 5.0 3.5 - 5.1 mmol/L   Chloride 107 101 - 111 mmol/L   CO2 23 22 - 32 mmol/L   Glucose, Bld 132 (H) 65 - 99 mg/dL   BUN 413102 (H) 6 - 20 mg/dL   Creatinine, Ser 2.447.72 (H) 0.61 - 1.24 mg/dL   Calcium 7.7 (L) 8.9 - 10.3 mg/dL   Phosphorus 9.5 (H) 2.5 - 4.6 mg/dL   Albumin 1.1 (L) 3.5 - 5.0 g/dL   GFR calc non Af Amer 9 (L) >60 mL/min   GFR calc Af Amer 10 (L) >60 mL/min   Anion gap 9 5 - 15  Glucose, capillary     Status: Abnormal   Collection Time: 01/14/16  7:25 PM  Result Value Ref Range   Glucose-Capillary 123 (H) 65 - 99 mg/dL  I-STAT 3, arterial blood gas (G3+)     Status: Abnormal   Collection Time: 01/14/16 11:16 PM  Result Value Ref Range   pH, Arterial 7.224 (L) 7.350 - 7.450   pCO2 arterial 57.9 (HH) 35.0 - 45.0 mmHg   pO2, Arterial 67.0 (L) 80.0 - 100.0 mmHg   Bicarbonate 24.4 (H) 20.0 - 24.0 mEq/L   TCO2 26 0 - 100 mmol/L   O2 Saturation 91.0 %   Acid-base deficit 4.0 (H) 0.0 - 2.0 mmol/L  Patient temperature 95.9 F    Sample type ARTERIAL    Comment NOTIFIED PHYSICIAN   Glucose, capillary     Status: Abnormal   Collection Time: 01/14/16 11:34 PM  Result Value Ref Range   Glucose-Capillary 117 (H) 65 - 99 mg/dL  Blood gas, arterial     Status: Abnormal   Collection Time: 01/15/16  3:40 AM  Result Value Ref Range   FIO2 0.60    Delivery systems VENTILATOR    Mode PRESSURE  REGULATED VOLUME CONTROL    VT 470 mL   LHR 12 resp/min   Peep/cpap 12.0 cm H20   pH, Arterial 7.250 (L) 7.350 - 7.450   pCO2 arterial 59.6 (HH) 35.0 - 45.0 mmHg   pO2, Arterial 68.8 (L) 80.0 - 100.0 mmHg   Bicarbonate 25.3 (H) 20.0 - 24.0 mEq/L   TCO2 27.1 0 - 100 mmol/L   Acid-base deficit 1.1 0.0 - 2.0 mmol/L   O2 Saturation 91.4 %   Patient temperature 98.6    Collection site RIGHT RADIAL    Drawn by [phone]    Sample type ARTERIAL DRAW    Allens test (pass/fail) PASS PASS  Glucose, capillary     Status: Abnormal   Collection Time: 01/15/16  4:05 AM  Result Value Ref Range   Glucose-Capillary 112 (H) 65 - 99 mg/dL  Renal function panel (daily at 0500)     Status: Abnormal   Collection Time: 01/15/16  6:20 AM  Result Value Ref Range   Sodium 138 135 - 145 mmol/L   Potassium 4.6 3.5 - 5.1 mmol/L   Chloride 100 (L) 101 - 111 mmol/L   CO2 26 22 - 32 mmol/L   Glucose, Bld 122 (H) 65 - 99 mg/dL   BUN 79 (H) 6 - 20 mg/dL   Creatinine, Ser 2.44 (H) 0.61 - 1.24 mg/dL   Calcium 7.6 (L) 8.9 - 10.3 mg/dL   Phosphorus 7.0 (H) 2.5 - 4.6 mg/dL   Albumin 1.1 (L) 3.5 - 5.0 g/dL   GFR calc non Af Amer 12 (L) >60 mL/min   GFR calc Af Amer 14 (L) >60 mL/min   Anion gap 12 5 - 15  Magnesium     Status: Abnormal   Collection Time: 01/15/16  6:20 AM  Result Value Ref Range   Magnesium 2.6 (H) 1.7 - 2.4 mg/dL  CBC with Differential/Platelet     Status: Abnormal   Collection Time: 01/15/16  6:20 AM  Result Value Ref Range   WBC 23.2 (H) 4.0 - 10.5 K/uL   RBC 2.39 (L) 4.22 - 5.81 MIL/uL   Hemoglobin 7.0 (L) 13.0 - 17.0 g/dL   HCT 01.0 (L) 27.2 - 53.6 %   MCV 89.5 78.0 - 100.0 fL   MCH 29.3 26.0 - 34.0 pg   MCHC 32.7 30.0 - 36.0 g/dL   RDW 64.4 (H) 03.4 - 74.2 %   Platelets 205 150 - 400 K/uL   Neutrophils Relative % 88 %   Lymphocytes Relative 3 %   Monocytes Relative 8 %   Eosinophils Relative 1 %   Basophils Relative 0 %   Neutro Abs 20.4 (H) 1.7 - 7.7 K/uL   Lymphs Abs 0.7 0.7 -  4.0 K/uL   Monocytes Absolute 1.9 (H) 0.1 - 1.0 K/uL   Eosinophils Absolute 0.2 0.0 - 0.7 K/uL   Basophils Absolute 0.0 0.0 - 0.1 K/uL   RBC Morphology POLYCHROMASIA PRESENT    WBC Morphology MILD LEFT SHIFT (1-5% METAS,  OCC MYELO, OCC BANDS)   Basic metabolic panel     Status: Abnormal   Collection Time: 01/15/16  6:20 AM  Result Value Ref Range   Sodium 137 135 - 145 mmol/L   Potassium 4.6 3.5 - 5.1 mmol/L   Chloride 99 (L) 101 - 111 mmol/L   CO2 27 22 - 32 mmol/L   Glucose, Bld 119 (H) 65 - 99 mg/dL   BUN 76 (H) 6 - 20 mg/dL   Creatinine, Ser 1.61 (H) 0.61 - 1.24 mg/dL   Calcium 7.6 (L) 8.9 - 10.3 mg/dL   GFR calc non Af Amer 12 (L) >60 mL/min   GFR calc Af Amer 14 (L) >60 mL/min   Anion gap 11 5 - 15  Glucose, capillary     Status: Abnormal   Collection Time: 01/15/16  7:43 AM  Result Value Ref Range   Glucose-Capillary 116 (H) 65 - 99 mg/dL   Comment 1 Notify RN    Comment 2 Document in Chart      Assessment/Plan:   NEURO  Altered Mental Status:  coma, sedation and chemically paralyzed ans sedated   Plan: CPM  PULM  Respiratory Acidosis (due to intrinsic lung disease) Atelectasis/collapse (focal and bibasilar) ARDS (non-pulmonary etiology Likely sepsis and shock)   Plan: Continue to paralyze and sedate as lung continues to improve.  CARDIO  Sinus Tachycardia   Plan: No specific treatment  RENAL  Oliguria (low effective intravascular volume and suspect ATN) Actue Renal Failure (etiology unknown)   Plan: Possible due to sepsis, but source has not been identified.  GI  Bowel Trauma with resectiono, Hepatic Trauma and Penetrating Abdominal Trauma   Plan: Drain output has decreased.    ID  No known infectious source, but on empiric Zosyn.     Plan: May discontinue Zosyn  HEME  Anemia acute blood loss anemia and anemia of critical illness)   Plan: Transfuse two units of blood  ENDO No known issues   Plan: CPm  Global Issues  Patient to start CRRT again today.   Okay to use heparin.  Pulmonary function is poor, but slowly improving on paralyzation and sedation.    Anemic to 7 and will give two units of blood today.  Getting tube feedings.    LOS: 11 days   Additional comments:I reviewed the patient's new clinical lab test results. cbc/bmet and I reviewed the patients new imaging test results. cxr  Critical Care Total Time*: 30 Minutes  Delbert Darley 01/15/2016  *Care during the described time interval was provided by me and/or other providers on the critical care team.  I have reviewed this patient's available data, including medical history, events of note, physical examination and test results as part of my evaluation.

## 2016-01-15 NOTE — Progress Notes (Signed)
RT note- decreased PEEP per ARDS protocol.

## 2016-01-15 NOTE — Procedures (Signed)
Arterial Catheter Insertion Procedure Note Janit PaganShiquan XXXmoore [id number] Jul 18, 1996  Procedure: Insertion of Arterial Catheter  Indications: Blood pressure monitoring  Procedure Details Consent: Risks of procedure as well as the alternatives and risks of each were explained to the (patient/caregiver).  Consent for procedure obtained. Time Out: Verified patient identification, verified procedure, site/side was marked, verified correct patient position, special equipment/implants available, medications/allergies/relevent history reviewed, required imaging and test results available.  Performed  Maximum sterile technique was used including cap, gloves, gown, hand hygiene, mask and sheet. Skin prep: Chlorhexidine; local anesthetic administered 20 gauge catheter was inserted into right radial artery using the Seldinger technique.  Evaluation Blood flow good; BP tracing good. Complications: No apparent complications.   Newt LukesGroendal, Anquinette Pierro Ann 01/15/2016
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
## 2016-01-16 ENCOUNTER — Inpatient Hospital Stay (HOSPITAL_COMMUNITY): Payer: Medicaid Other

## 2016-01-16 LAB — POCT ACTIVATED CLOTTING TIME
Activated Clotting Time: 164 seconds
Activated Clotting Time: 164 seconds
Activated Clotting Time: 169 seconds
Activated Clotting Time: 175 seconds
Activated Clotting Time: 186 seconds
Activated Clotting Time: 180 seconds
Activated Clotting Time: 186 seconds
Activated Clotting Time: 180 seconds
Activated Clotting Time: 180 seconds
Activated Clotting Time: 186 seconds
Activated Clotting Time: 175 seconds
Activated Clotting Time: 180 seconds
Activated Clotting Time: 186 seconds
Activated Clotting Time: 186 seconds
Activated Clotting Time: 191 seconds
Activated Clotting Time: 186 seconds
Activated Clotting Time: 186 seconds
Activated Clotting Time: 197 seconds
Activated Clotting Time: 197 seconds

## 2016-01-16 LAB — RENAL FUNCTION PANEL
Albumin: 1.2 g/dL — ABNORMAL LOW (ref 3.5–5.0)
Albumin: 1.1 g/dL — ABNORMAL LOW (ref 3.5–5.0)
Anion gap: 7 (ref 5–15)
Anion gap: 6 (ref 5–15)
BUN: 66 mg/dL — ABNORMAL HIGH (ref 6–20)
BUN: 66 mg/dL — ABNORMAL HIGH (ref 6–20)
CO2: 29 mmol/L (ref 22–32)
CO2: 28 mmol/L (ref 22–32)
Calcium: 7.4 mg/dL — ABNORMAL LOW (ref 8.9–10.3)
Calcium: 7.4 mg/dL — ABNORMAL LOW (ref 8.9–10.3)
Chloride: 100 mmol/L — ABNORMAL LOW (ref 101–111)
Chloride: 101 mmol/L (ref 101–111)
Creatinine, Ser: 5.02 mg/dL — ABNORMAL HIGH (ref 0.61–1.24)
Creatinine, Ser: 4.74 mg/dL — ABNORMAL HIGH (ref 0.61–1.24)
GFR calc Af Amer: 18 mL/min — ABNORMAL LOW (ref >60)
GFR calc Af Amer: 19 mL/min — ABNORMAL LOW (ref >60)
GFR calc non Af Amer: 15 mL/min — ABNORMAL LOW (ref >60)
GFR calc non Af Amer: 16 mL/min — ABNORMAL LOW (ref >60)
Glucose, Bld: 111 mg/dL — ABNORMAL HIGH (ref 65–99)
Glucose, Bld: 92 mg/dL (ref 65–99)
Phosphorus: 5.6 mg/dL — ABNORMAL HIGH (ref 2.5–4.6)
Phosphorus: 5.3 mg/dL — ABNORMAL HIGH (ref 2.5–4.6)
Potassium: 4.2 mmol/L (ref 3.5–5.1)
Potassium: 4.2 mmol/L (ref 3.5–5.1)
Sodium: 136 mmol/L (ref 135–145)
Sodium: 135 mmol/L (ref 135–145)

## 2016-01-16 LAB — MAGNESIUM: Magnesium: 2.6 mg/dL — ABNORMAL HIGH (ref 1.7–2.4)

## 2016-01-16 LAB — BLOOD GAS, ARTERIAL
Acid-Base Excess: 4.7 mmol/L — ABNORMAL HIGH (ref 0.0–2.0)
Bicarbonate: 30.3 mEq/L — ABNORMAL HIGH (ref 20.0–24.0)
Drawn by: 129711
FIO2: 0.6
O2 Saturation: 92.1 %
PEEP: 12 cmH2O
Patient temperature: 97.9
RATE: 35 resp/min
TCO2: 32.1 mmol/L (ref 0–100)
pCO2 arterial: 58.2 mmHg (ref 35.0–45.0)
pH, Arterial: 7.334 — ABNORMAL LOW (ref 7.350–7.450)
pO2, Arterial: 66.8 mmHg — ABNORMAL LOW (ref 80.0–100.0)

## 2016-01-16 LAB — HEPATIC FUNCTION PANEL
ALT: 53 U/L (ref 17–63)
AST: 42 U/L — ABNORMAL HIGH (ref 15–41)
Albumin: 1.2 g/dL — ABNORMAL LOW (ref 3.5–5.0)
Alkaline Phosphatase: 82 U/L (ref 38–126)
Bilirubin, Direct: 0.4 mg/dL (ref 0.1–0.5)
Indirect Bilirubin: 0.4 mg/dL (ref 0.3–0.9)
Total Bilirubin: 0.8 mg/dL (ref 0.3–1.2)
Total Protein: 6.2 g/dL — ABNORMAL LOW (ref 6.5–8.1)

## 2016-01-16 LAB — GLUCOSE, CAPILLARY
Glucose-Capillary: 100 mg/dL — ABNORMAL HIGH (ref 65–99)
Glucose-Capillary: 101 mg/dL — ABNORMAL HIGH (ref 65–99)
Glucose-Capillary: 88 mg/dL (ref 65–99)
Glucose-Capillary: 88 mg/dL (ref 65–99)
Glucose-Capillary: 92 mg/dL (ref 65–99)

## 2016-01-16 LAB — CBC WITH DIFFERENTIAL/PLATELET
Basophils Absolute: 0.1 10*3/uL (ref 0.0–0.1)
Basophils Relative: 0 %
Eosinophils Absolute: 0.2 10*3/uL (ref 0.0–0.7)
Eosinophils Relative: 1 %
HCT: 25.5 % — ABNORMAL LOW (ref 39.0–52.0)
Hemoglobin: 8.3 g/dL — ABNORMAL LOW (ref 13.0–17.0)
Lymphocytes Relative: 4 %
Lymphs Abs: 0.9 10*3/uL (ref 0.7–4.0)
MCH: 27.6 pg (ref 26.0–34.0)
MCHC: 32.5 g/dL (ref 30.0–36.0)
MCV: 84.7 fL (ref 78.0–100.0)
Monocytes Absolute: 1.7 10*3/uL — ABNORMAL HIGH (ref 0.1–1.0)
Monocytes Relative: 7 %
Neutro Abs: 21 10*3/uL — ABNORMAL HIGH (ref 1.7–7.7)
Neutrophils Relative %: 88 %
Platelets: 178 10*3/uL (ref 150–400)
RBC: 3.01 MIL/uL — ABNORMAL LOW (ref 4.22–5.81)
RDW: 15.6 % — ABNORMAL HIGH (ref 11.5–15.5)
WBC: 23.1 10*3/uL — ABNORMAL HIGH (ref 4.0–10.5)

## 2016-01-16 LAB — APTT: aPTT: 40 seconds — ABNORMAL HIGH (ref 24–37)

## 2016-01-16 MED ORDER — PROPOFOL 1000 MG/100ML IV EMUL
5.0000 ug/kg/min | INTRAVENOUS | Status: DC
  Administered 2016-01-16 – 2016-01-17 (×5): 30 ug/kg/min via INTRAVENOUS
  Administered 2016-01-17 (×2): 25 ug/kg/min via INTRAVENOUS
  Administered 2016-01-17: 30 ug/kg/min via INTRAVENOUS
  Administered 2016-01-18 – 2016-01-19 (×10): 40 ug/kg/min via INTRAVENOUS
  Administered 2016-01-20: 50 ug/kg/min via INTRAVENOUS
  Administered 2016-01-20 (×2): 40 ug/kg/min via INTRAVENOUS
  Administered 2016-01-20: 50 ug/kg/min via INTRAVENOUS
  Administered 2016-01-20 – 2016-01-21 (×4): 40 ug/kg/min via INTRAVENOUS
  Administered 2016-01-21: 50 ug/kg/min via INTRAVENOUS
  Administered 2016-01-21 (×2): 40 ug/kg/min via INTRAVENOUS
  Administered 2016-01-21: 25 ug/kg/min via INTRAVENOUS
  Administered 2016-01-21: 50 ug/kg/min via INTRAVENOUS
  Administered 2016-01-22: 40 ug/kg/min via INTRAVENOUS
  Administered 2016-01-22: 30 ug/kg/min via INTRAVENOUS
  Administered 2016-01-22: 40 ug/kg/min via INTRAVENOUS
  Administered 2016-01-22: 30 ug/kg/min via INTRAVENOUS
  Administered 2016-01-22: 35 ug/kg/min via INTRAVENOUS
  Administered 2016-01-22: 30 ug/kg/min via INTRAVENOUS
  Administered 2016-01-23: 40 ug/kg/min via INTRAVENOUS
  Filled 2016-01-16 (×3): qty 100
  Filled 2016-01-16: qty 200
  Filled 2016-01-16 (×3): qty 100
  Filled 2016-01-16: qty 200
  Filled 2016-01-16 (×30): qty 100
  Filled 2016-01-16: qty 200

## 2016-01-16 MED ORDER — SODIUM CHLORIDE 0.9 % IV SOLN
2.0000 mg/h | INTRAVENOUS | Status: DC
  Administered 2016-01-16 (×2): 12 mg/h via INTRAVENOUS
  Filled 2016-01-16 (×2): qty 20

## 2016-01-16 MED ORDER — METOPROLOL TARTRATE 5 MG/5ML IV SOLN
10.0000 mg | Freq: Four times a day (QID) | INTRAVENOUS | Status: DC | PRN
  Administered 2016-01-19 – 2016-01-29 (×3): 10 mg via INTRAVENOUS
  Filled 2016-01-16 (×4): qty 10

## 2016-01-16 MED ORDER — PROPOFOL 500 MG/50ML IV EMUL
INTRAVENOUS | Status: AC
  Filled 2016-01-16: qty 50

## 2016-01-16 MED ORDER — FENTANYL 50 MCG/HR TD PT72
100.0000 ug | MEDICATED_PATCH | TRANSDERMAL | Status: DC
  Administered 2016-01-17 – 2016-01-23 (×3): 100 ug via TRANSDERMAL
  Filled 2016-01-16 (×4): qty 2

## 2016-01-16 NOTE — Progress Notes (Signed)
CRITICAL VALUE ALERT  Critical value received:  7.33 PH  Date of notification:  01/16/2016  Time of notification:  0915  Critical value read back:Yes.    Nurse who received alert:  J.Daleen Squibbhomasson, RN  MD notified (1st page):  S.Minor, FNP   Time of first page:  0920  MD notified (2nd page):S. Minor, FNP  Time of second ZOXW:[id number]:[id number]  Responding MD: Dr. Marchelle Gearingamaswamy  Time MD responded:  0930

## 2016-01-16 NOTE — Progress Notes (Signed)
RT note- ETT advanced peer order.
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
## 2016-01-16 NOTE — Progress Notes (Signed)
eLink Physician-Brief Progress Note Patient Name: Samuel Owens DOB: [birth year] MRN: [id number]   Date of Service  01/16/2016  HPI/Events of Note  Patient with increased HR and BP in spite of Fentanyl and Versed IV infusions at their ceiling of 400 mcg/hour and 10 mg/hour respectively.   eICU Interventions  Will order: 1. Increase Versed ceiling to 12 mg/hour.  2. Already has frequent PRN doses of Fentanyl and Versed ordered. Nurse encouraged to use currently ordered drugs to maximum allowed.      Intervention Category Minor Interventions: Agitation / anxiety - evaluation and management  Sommer,Steven Eugene 01/16/2016, 1:58 AM
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
## 2016-01-16 NOTE — Progress Notes (Signed)
Versed 55 mg was wasted in sink and witnessed by Verna CzechScott C., RN.
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
## 2016-01-17 ENCOUNTER — Inpatient Hospital Stay (HOSPITAL_COMMUNITY): Payer: Medicaid Other

## 2016-01-17 LAB — TYPE AND SCREEN
ABO/RH(D): AB POS
Antibody Screen: POSITIVE
DAT, IgG: POSITIVE
DAT, complement: NEGATIVE
Donor AG Type: NEGATIVE
Donor AG Type: NEGATIVE
Unit division: 0
Unit division: 0

## 2016-01-17 LAB — RENAL FUNCTION PANEL
Albumin: 1.2 g/dL — ABNORMAL LOW (ref 3.5–5.0)
Albumin: 1.3 g/dL — ABNORMAL LOW (ref 3.5–5.0)
Anion gap: 7 (ref 5–15)
Anion gap: 6 (ref 5–15)
BUN: 61 mg/dL — ABNORMAL HIGH (ref 6–20)
BUN: 56 mg/dL — ABNORMAL HIGH (ref 6–20)
CO2: 28 mmol/L (ref 22–32)
CO2: 27 mmol/L (ref 22–32)
Calcium: 7.8 mg/dL — ABNORMAL LOW (ref 8.9–10.3)
Calcium: 7.8 mg/dL — ABNORMAL LOW (ref 8.9–10.3)
Chloride: 100 mmol/L — ABNORMAL LOW (ref 101–111)
Chloride: 101 mmol/L (ref 101–111)
Creatinine, Ser: 4.35 mg/dL — ABNORMAL HIGH (ref 0.61–1.24)
Creatinine, Ser: 4.28 mg/dL — ABNORMAL HIGH (ref 0.61–1.24)
GFR calc Af Amer: 21 mL/min — ABNORMAL LOW (ref >60)
GFR calc Af Amer: 21 mL/min — ABNORMAL LOW (ref >60)
GFR calc non Af Amer: 18 mL/min — ABNORMAL LOW (ref >60)
GFR calc non Af Amer: 18 mL/min — ABNORMAL LOW (ref >60)
Glucose, Bld: 86 mg/dL (ref 65–99)
Glucose, Bld: 99 mg/dL (ref 65–99)
Phosphorus: 5 mg/dL — ABNORMAL HIGH (ref 2.5–4.6)
Phosphorus: 5 mg/dL — ABNORMAL HIGH (ref 2.5–4.6)
Potassium: 3.7 mmol/L (ref 3.5–5.1)
Potassium: 3.7 mmol/L (ref 3.5–5.1)
Sodium: 135 mmol/L (ref 135–145)
Sodium: 134 mmol/L — ABNORMAL LOW (ref 135–145)

## 2016-01-17 LAB — BASIC METABOLIC PANEL
Anion gap: 8 (ref 5–15)
BUN: 62 mg/dL — ABNORMAL HIGH (ref 6–20)
CO2: 27 mmol/L (ref 22–32)
Calcium: 7.8 mg/dL — ABNORMAL LOW (ref 8.9–10.3)
Chloride: 100 mmol/L — ABNORMAL LOW (ref 101–111)
Creatinine, Ser: 4.5 mg/dL — ABNORMAL HIGH (ref 0.61–1.24)
GFR calc Af Amer: 20 mL/min — ABNORMAL LOW (ref >60)
GFR calc non Af Amer: 17 mL/min — ABNORMAL LOW (ref >60)
Glucose, Bld: 88 mg/dL (ref 65–99)
Potassium: 3.6 mmol/L (ref 3.5–5.1)
Sodium: 135 mmol/L (ref 135–145)

## 2016-01-17 LAB — CBC WITH DIFFERENTIAL/PLATELET
Basophils Absolute: 0 10*3/uL (ref 0.0–0.1)
Basophils Relative: 0 %
Eosinophils Absolute: 0.5 10*3/uL (ref 0.0–0.7)
Eosinophils Relative: 2 %
HCT: 24.4 % — ABNORMAL LOW (ref 39.0–52.0)
Hemoglobin: 8 g/dL — ABNORMAL LOW (ref 13.0–17.0)
Lymphocytes Relative: 11 %
Lymphs Abs: 2.5 10*3/uL (ref 0.7–4.0)
MCH: 28.7 pg (ref 26.0–34.0)
MCHC: 32.8 g/dL (ref 30.0–36.0)
MCV: 87.5 fL (ref 78.0–100.0)
Monocytes Absolute: 1.4 10*3/uL — ABNORMAL HIGH (ref 0.1–1.0)
Monocytes Relative: 6 %
Neutro Abs: 18.3 10*3/uL — ABNORMAL HIGH (ref 1.7–7.7)
Neutrophils Relative %: 81 %
Platelets: 167 10*3/uL (ref 150–400)
RBC: 2.79 MIL/uL — ABNORMAL LOW (ref 4.22–5.81)
RDW: 16.2 % — ABNORMAL HIGH (ref 11.5–15.5)
WBC: 22.7 10*3/uL — ABNORMAL HIGH (ref 4.0–10.5)

## 2016-01-17 LAB — POCT ACTIVATED CLOTTING TIME
Activated Clotting Time: 136 seconds
Activated Clotting Time: 142 seconds
Activated Clotting Time: 158 seconds
Activated Clotting Time: 191 seconds
Activated Clotting Time: 191 seconds
Activated Clotting Time: 191 seconds
Activated Clotting Time: 197 seconds
Activated Clotting Time: 202 seconds
Activated Clotting Time: 208 seconds
Activated Clotting Time: 213 seconds
Activated Clotting Time: 219 seconds
Activated Clotting Time: 219 seconds

## 2016-01-17 LAB — PHOSPHORUS: Phosphorus: 5 mg/dL — ABNORMAL HIGH (ref 2.5–4.6)

## 2016-01-17 LAB — APTT: aPTT: 80 seconds — ABNORMAL HIGH (ref 24–37)

## 2016-01-17 LAB — GLUCOSE, CAPILLARY
Glucose-Capillary: 82 mg/dL (ref 65–99)
Glucose-Capillary: 83 mg/dL (ref 65–99)
Glucose-Capillary: 96 mg/dL (ref 65–99)
Glucose-Capillary: 89 mg/dL (ref 65–99)
Glucose-Capillary: 101 mg/dL — ABNORMAL HIGH (ref 65–99)
Glucose-Capillary: 91 mg/dL (ref 65–99)
Glucose-Capillary: 89 mg/dL (ref 65–99)

## 2016-01-17 LAB — MAGNESIUM: Magnesium: 2.6 mg/dL — ABNORMAL HIGH (ref 1.7–2.4)

## 2016-01-17 LAB — TRIGLYCERIDES: Triglycerides: 292 mg/dL — ABNORMAL HIGH (ref <150)

## 2016-01-17 MED ORDER — CLONAZEPAM 0.1 MG/ML ORAL SUSPENSION
2.0000 mg | Freq: Three times a day (TID) | ORAL | Status: DC
  Filled 2016-01-17: qty 20

## 2016-01-17 MED ORDER — PRO-STAT SUGAR FREE PO LIQD
60.0000 mL | Freq: Four times a day (QID) | ORAL | Status: DC
  Administered 2016-01-17 – 2016-01-23 (×23): 60 mL
  Filled 2016-01-17 (×22): qty 60

## 2016-01-17 MED ORDER — CLONAZEPAM 1 MG PO TABS
2.0000 mg | ORAL_TABLET | Freq: Three times a day (TID) | ORAL | Status: DC
  Administered 2016-01-17 – 2016-01-23 (×18): 2 mg
  Filled 2016-01-17 (×18): qty 2

## 2016-01-17 MED ORDER — QUETIAPINE FUMARATE 200 MG PO TABS
200.0000 mg | ORAL_TABLET | Freq: Three times a day (TID) | ORAL | Status: DC
  Administered 2016-01-17 – 2016-01-23 (×18): 200 mg
  Filled 2016-01-17 (×18): qty 1
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
## 2016-01-18 ENCOUNTER — Inpatient Hospital Stay (HOSPITAL_COMMUNITY): Payer: Medicaid Other

## 2016-01-18 ENCOUNTER — Encounter (HOSPITAL_COMMUNITY): Payer: Self-pay | Admitting: *Deleted

## 2016-01-18 LAB — BASIC METABOLIC PANEL
Anion gap: 7 (ref 5–15)
BUN: 56 mg/dL — ABNORMAL HIGH (ref 6–20)
CO2: 27 mmol/L (ref 22–32)
Calcium: 8 mg/dL — ABNORMAL LOW (ref 8.9–10.3)
Chloride: 98 mmol/L — ABNORMAL LOW (ref 101–111)
Creatinine, Ser: 4.11 mg/dL — ABNORMAL HIGH (ref 0.61–1.24)
GFR calc Af Amer: 22 mL/min — ABNORMAL LOW (ref >60)
GFR calc non Af Amer: 19 mL/min — ABNORMAL LOW (ref >60)
Glucose, Bld: 106 mg/dL — ABNORMAL HIGH (ref 65–99)
Potassium: 3.2 mmol/L — ABNORMAL LOW (ref 3.5–5.1)
Sodium: 132 mmol/L — ABNORMAL LOW (ref 135–145)

## 2016-01-18 LAB — RENAL FUNCTION PANEL
Albumin: 1.4 g/dL — ABNORMAL LOW (ref 3.5–5.0)
Albumin: 1.5 g/dL — ABNORMAL LOW (ref 3.5–5.0)
Anion gap: 8 (ref 5–15)
Anion gap: 7 (ref 5–15)
BUN: 56 mg/dL — ABNORMAL HIGH (ref 6–20)
BUN: 53 mg/dL — ABNORMAL HIGH (ref 6–20)
CO2: 27 mmol/L (ref 22–32)
CO2: 27 mmol/L (ref 22–32)
Calcium: 8 mg/dL — ABNORMAL LOW (ref 8.9–10.3)
Calcium: 8.2 mg/dL — ABNORMAL LOW (ref 8.9–10.3)
Chloride: 99 mmol/L — ABNORMAL LOW (ref 101–111)
Chloride: 102 mmol/L (ref 101–111)
Creatinine, Ser: 4.08 mg/dL — ABNORMAL HIGH (ref 0.61–1.24)
Creatinine, Ser: 4.03 mg/dL — ABNORMAL HIGH (ref 0.61–1.24)
GFR calc Af Amer: 23 mL/min — ABNORMAL LOW (ref >60)
GFR calc Af Amer: 23 mL/min — ABNORMAL LOW (ref >60)
GFR calc non Af Amer: 19 mL/min — ABNORMAL LOW (ref >60)
GFR calc non Af Amer: 20 mL/min — ABNORMAL LOW (ref >60)
Glucose, Bld: 107 mg/dL — ABNORMAL HIGH (ref 65–99)
Glucose, Bld: 99 mg/dL (ref 65–99)
Phosphorus: 5.5 mg/dL — ABNORMAL HIGH (ref 2.5–4.6)
Phosphorus: 5.6 mg/dL — ABNORMAL HIGH (ref 2.5–4.6)
Potassium: 3.3 mmol/L — ABNORMAL LOW (ref 3.5–5.1)
Potassium: 3.4 mmol/L — ABNORMAL LOW (ref 3.5–5.1)
Sodium: 134 mmol/L — ABNORMAL LOW (ref 135–145)
Sodium: 136 mmol/L (ref 135–145)

## 2016-01-18 LAB — CBC WITH DIFFERENTIAL/PLATELET
Basophils Absolute: 0 10*3/uL (ref 0.0–0.1)
Basophils Relative: 0 %
Eosinophils Absolute: 0.5 10*3/uL (ref 0.0–0.7)
Eosinophils Relative: 2 %
HCT: 24.8 % — ABNORMAL LOW (ref 39.0–52.0)
Hemoglobin: 8 g/dL — ABNORMAL LOW (ref 13.0–17.0)
Lymphocytes Relative: 6 %
Lymphs Abs: 1.6 10*3/uL (ref 0.7–4.0)
MCH: 28.3 pg (ref 26.0–34.0)
MCHC: 32.3 g/dL (ref 30.0–36.0)
MCV: 87.6 fL (ref 78.0–100.0)
Monocytes Absolute: 1.6 10*3/uL — ABNORMAL HIGH (ref 0.1–1.0)
Monocytes Relative: 6 %
Neutro Abs: 23.3 10*3/uL — ABNORMAL HIGH (ref 1.7–7.7)
Neutrophils Relative %: 86 %
Platelets: 168 10*3/uL (ref 150–400)
RBC: 2.83 MIL/uL — ABNORMAL LOW (ref 4.22–5.81)
RDW: 16 % — ABNORMAL HIGH (ref 11.5–15.5)
WBC: 27 10*3/uL — ABNORMAL HIGH (ref 4.0–10.5)

## 2016-01-18 LAB — POCT I-STAT 3, ART BLOOD GAS (G3+)
Acid-Base Excess: 3 mmol/L — ABNORMAL HIGH (ref 0.0–2.0)
Bicarbonate: 29.7 mEq/L — ABNORMAL HIGH (ref 20.0–24.0)
O2 Saturation: 97 %
Patient temperature: 35.6
TCO2: 31 mmol/L (ref 0–100)
pCO2 arterial: 53.8 mmHg — ABNORMAL HIGH (ref 35.0–45.0)
pH, Arterial: 7.344 — ABNORMAL LOW (ref 7.350–7.450)
pO2, Arterial: 89 mmHg (ref 80.0–100.0)

## 2016-01-18 LAB — POCT ACTIVATED CLOTTING TIME
Activated Clotting Time: 197 seconds
Activated Clotting Time: 208 seconds
Activated Clotting Time: 208 seconds
Activated Clotting Time: 213 seconds
Activated Clotting Time: 213 seconds
Activated Clotting Time: 213 seconds
Activated Clotting Time: 230 seconds
Activated Clotting Time: 230 seconds

## 2016-01-18 LAB — CLOSTRIDIUM DIFFICILE BY PCR: Toxigenic C. Difficile by PCR: NEGATIVE

## 2016-01-18 LAB — APTT: aPTT: 75 seconds — ABNORMAL HIGH (ref 24–37)

## 2016-01-18 LAB — GLUCOSE, CAPILLARY
Glucose-Capillary: 100 mg/dL — ABNORMAL HIGH (ref 65–99)
Glucose-Capillary: 82 mg/dL (ref 65–99)
Glucose-Capillary: 109 mg/dL — ABNORMAL HIGH (ref 65–99)
Glucose-Capillary: 98 mg/dL (ref 65–99)
Glucose-Capillary: 97 mg/dL (ref 65–99)
Glucose-Capillary: 98 mg/dL (ref 65–99)

## 2016-01-18 LAB — C DIFFICILE QUICK SCREEN W PCR REFLEX
C Diff antigen: POSITIVE — AB
C Diff toxin: NEGATIVE

## 2016-01-18 LAB — MAGNESIUM: Magnesium: 2.7 mg/dL — ABNORMAL HIGH (ref 1.7–2.4)

## 2016-01-18 MED ORDER — PRISMASOL BGK 4/2.5 32-4-2.5 MEQ/L IV SOLN
INTRAVENOUS | Status: DC
  Administered 2016-01-18 – 2016-01-23 (×10): via INTRAVENOUS_CENTRAL
  Filled 2016-01-18 (×11): qty 5000

## 2016-01-18 MED ORDER — PRISMASOL BGK 4/2.5 32-4-2.5 MEQ/L IV SOLN
INTRAVENOUS | Status: DC
  Administered 2016-01-18 – 2016-01-23 (×37): via INTRAVENOUS_CENTRAL
  Filled 2016-01-18 (×51): qty 5000

## 2016-01-18 MED ORDER — ENOXAPARIN SODIUM 40 MG/0.4ML ~~LOC~~ SOLN
40.0000 mg | SUBCUTANEOUS | Status: DC
  Administered 2016-01-18 – 2016-01-25 (×8): 40 mg via SUBCUTANEOUS
  Filled 2016-01-18 (×9): qty 0.4

## 2016-01-18 MED ORDER — PRISMASOL BGK 4/2.5 32-4-2.5 MEQ/L IV SOLN
INTRAVENOUS | Status: DC
  Administered 2016-01-18 – 2016-01-23 (×13): via INTRAVENOUS_CENTRAL
  Filled 2016-01-18 (×16): qty 5000
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
## 2016-01-18 NOTE — Progress Notes (Signed)
ABG results called back to Dr Janee Mornhompson by Nehemiah MassedSharp, Tige Meas S RRT at 1310 on 01/18/16. No changes at this time per MD.   ABG    Component Value Date/Time   PHART 7.344* 01/18/2016 1304   PCO2ART 53.8* 01/18/2016 1304   PO2ART 89.0 01/18/2016 1304   HCO3 29.7* 01/18/2016 1304   TCO2 31 01/18/2016 1304   ACIDBASEDEF 1.1 01/15/2016 0340   O2SAT 97.0 01/18/2016 1304

## 2016-01-19 ENCOUNTER — Inpatient Hospital Stay (HOSPITAL_COMMUNITY): Payer: Medicaid Other

## 2016-01-19 LAB — GLUCOSE, CAPILLARY
Glucose-Capillary: 83 mg/dL (ref 65–99)
Glucose-Capillary: 91 mg/dL (ref 65–99)
Glucose-Capillary: 80 mg/dL (ref 65–99)
Glucose-Capillary: 86 mg/dL (ref 65–99)
Glucose-Capillary: 107 mg/dL — ABNORMAL HIGH (ref 65–99)

## 2016-01-19 LAB — RENAL FUNCTION PANEL
Albumin: 1.5 g/dL — ABNORMAL LOW (ref 3.5–5.0)
Albumin: 1.6 g/dL — ABNORMAL LOW (ref 3.5–5.0)
Anion gap: 9 (ref 5–15)
Anion gap: 6 (ref 5–15)
BUN: 54 mg/dL — ABNORMAL HIGH (ref 6–20)
BUN: 52 mg/dL — ABNORMAL HIGH (ref 6–20)
CO2: 26 mmol/L (ref 22–32)
CO2: 26 mmol/L (ref 22–32)
Calcium: 8.5 mg/dL — ABNORMAL LOW (ref 8.9–10.3)
Calcium: 8.4 mg/dL — ABNORMAL LOW (ref 8.9–10.3)
Chloride: 101 mmol/L (ref 101–111)
Chloride: 103 mmol/L (ref 101–111)
Creatinine, Ser: 3.82 mg/dL — ABNORMAL HIGH (ref 0.61–1.24)
Creatinine, Ser: 3.56 mg/dL — ABNORMAL HIGH (ref 0.61–1.24)
GFR calc Af Amer: 24 mL/min — ABNORMAL LOW (ref >60)
GFR calc Af Amer: 27 mL/min — ABNORMAL LOW (ref >60)
GFR calc non Af Amer: 21 mL/min — ABNORMAL LOW (ref >60)
GFR calc non Af Amer: 23 mL/min — ABNORMAL LOW (ref >60)
Glucose, Bld: 113 mg/dL — ABNORMAL HIGH (ref 65–99)
Glucose, Bld: 95 mg/dL (ref 65–99)
Phosphorus: 4.5 mg/dL (ref 2.5–4.6)
Phosphorus: 4.8 mg/dL — ABNORMAL HIGH (ref 2.5–4.6)
Potassium: 3.6 mmol/L (ref 3.5–5.1)
Potassium: 3.8 mmol/L (ref 3.5–5.1)
Sodium: 136 mmol/L (ref 135–145)
Sodium: 135 mmol/L (ref 135–145)

## 2016-01-19 LAB — POCT ACTIVATED CLOTTING TIME
Activated Clotting Time: 191 seconds
Activated Clotting Time: 197 seconds
Activated Clotting Time: 213 seconds
Activated Clotting Time: 191 seconds
Activated Clotting Time: 191 seconds
Activated Clotting Time: 191 seconds
Activated Clotting Time: 202 seconds

## 2016-01-19 LAB — BLOOD GAS, ARTERIAL
Acid-Base Excess: 1.7 mmol/L (ref 0.0–2.0)
Bicarbonate: 26.7 mEq/L — ABNORMAL HIGH (ref 20.0–24.0)
Drawn by: 345601
FIO2: 0.4
MECHVT: 470 mL
O2 Saturation: 97.6 %
PEEP: 8 cmH2O
Patient temperature: 98.2
RATE: 35 resp/min
TCO2: 28.2 mmol/L (ref 0–100)
pCO2 arterial: 48.6 mmHg — ABNORMAL HIGH (ref 35.0–45.0)
pH, Arterial: 7.358 (ref 7.350–7.450)
pO2, Arterial: 105 mmHg — ABNORMAL HIGH (ref 80.0–100.0)

## 2016-01-19 LAB — MAGNESIUM: Magnesium: 2.6 mg/dL — ABNORMAL HIGH (ref 1.7–2.4)

## 2016-01-19 LAB — APTT: aPTT: 78 seconds — ABNORMAL HIGH (ref 24–37)

## 2016-01-19 MED ORDER — IPRATROPIUM-ALBUTEROL 0.5-2.5 (3) MG/3ML IN SOLN
3.0000 mL | Freq: Four times a day (QID) | RESPIRATORY_TRACT | Status: DC
  Administered 2016-01-19 – 2016-01-22 (×12): 3 mL via RESPIRATORY_TRACT
  Filled 2016-01-19 (×12): qty 3
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
## 2016-01-20 LAB — RENAL FUNCTION PANEL
Albumin: 1.6 g/dL — ABNORMAL LOW (ref 3.5–5.0)
Albumin: 1.5 g/dL — ABNORMAL LOW (ref 3.5–5.0)
Anion gap: 8 (ref 5–15)
Anion gap: 5 (ref 5–15)
BUN: 53 mg/dL — ABNORMAL HIGH (ref 6–20)
BUN: 52 mg/dL — ABNORMAL HIGH (ref 6–20)
CO2: 26 mmol/L (ref 22–32)
CO2: 26 mmol/L (ref 22–32)
Calcium: 8.7 mg/dL — ABNORMAL LOW (ref 8.9–10.3)
Calcium: 8.2 mg/dL — ABNORMAL LOW (ref 8.9–10.3)
Chloride: 100 mmol/L — ABNORMAL LOW (ref 101–111)
Chloride: 103 mmol/L (ref 101–111)
Creatinine, Ser: 3.54 mg/dL — ABNORMAL HIGH (ref 0.61–1.24)
Creatinine, Ser: 3.58 mg/dL — ABNORMAL HIGH (ref 0.61–1.24)
GFR calc Af Amer: 27 mL/min — ABNORMAL LOW (ref >60)
GFR calc Af Amer: 26 mL/min — ABNORMAL LOW (ref >60)
GFR calc non Af Amer: 23 mL/min — ABNORMAL LOW (ref >60)
GFR calc non Af Amer: 23 mL/min — ABNORMAL LOW (ref >60)
Glucose, Bld: 99 mg/dL (ref 65–99)
Glucose, Bld: 99 mg/dL (ref 65–99)
Phosphorus: 4.5 mg/dL (ref 2.5–4.6)
Phosphorus: 4.2 mg/dL (ref 2.5–4.6)
Potassium: 4.3 mmol/L (ref 3.5–5.1)
Potassium: 4.2 mmol/L (ref 3.5–5.1)
Sodium: 134 mmol/L — ABNORMAL LOW (ref 135–145)
Sodium: 134 mmol/L — ABNORMAL LOW (ref 135–145)

## 2016-01-20 LAB — BLOOD GAS, ARTERIAL
Acid-Base Excess: 2.1 mmol/L — ABNORMAL HIGH (ref 0.0–2.0)
Acid-Base Excess: 0.9 mmol/L (ref 0.0–2.0)
Bicarbonate: 26.9 mEq/L — ABNORMAL HIGH (ref 20.0–24.0)
Bicarbonate: 26 mEq/L — ABNORMAL HIGH (ref 20.0–24.0)
Drawn by: 236041
Drawn by: 236041
FIO2: 0.4
FIO2: 0.4
MECHVT: 470 mL
MECHVT: 660 mL
O2 Saturation: 98.1 %
O2 Saturation: 96.7 %
PEEP: 5 cmH2O
PEEP: 5 cmH2O
Patient temperature: 98.6
Patient temperature: 98
RATE: 35 resp/min
RATE: 15 resp/min
TCO2: 28.4 mmol/L (ref 0–100)
TCO2: 27.5 mmol/L (ref 0–100)
pCO2 arterial: 48.1 mmHg — ABNORMAL HIGH (ref 35.0–45.0)
pCO2 arterial: 48.8 mmHg — ABNORMAL HIGH (ref 35.0–45.0)
pH, Arterial: 7.366 (ref 7.350–7.450)
pH, Arterial: 7.344 — ABNORMAL LOW (ref 7.350–7.450)
pO2, Arterial: 106 mmHg — ABNORMAL HIGH (ref 80.0–100.0)
pO2, Arterial: 91.3 mmHg (ref 80.0–100.0)

## 2016-01-20 LAB — POCT ACTIVATED CLOTTING TIME
Activated Clotting Time: 191 seconds
Activated Clotting Time: 191 seconds
Activated Clotting Time: 197 seconds
Activated Clotting Time: 197 seconds
Activated Clotting Time: 202 seconds
Activated Clotting Time: 202 seconds
Activated Clotting Time: 208 seconds
Activated Clotting Time: 208 seconds
Activated Clotting Time: 230 seconds

## 2016-01-20 LAB — GLUCOSE, CAPILLARY
Glucose-Capillary: 100 mg/dL — ABNORMAL HIGH (ref 65–99)
Glucose-Capillary: 87 mg/dL (ref 65–99)
Glucose-Capillary: 88 mg/dL (ref 65–99)
Glucose-Capillary: 89 mg/dL (ref 65–99)
Glucose-Capillary: 89 mg/dL (ref 65–99)
Glucose-Capillary: 90 mg/dL (ref 65–99)
Glucose-Capillary: 91 mg/dL (ref 65–99)
Glucose-Capillary: 95 mg/dL (ref 65–99)

## 2016-01-20 LAB — APTT: aPTT: 87 seconds — ABNORMAL HIGH (ref 24–37)

## 2016-01-20 LAB — MAGNESIUM: Magnesium: 2.6 mg/dL — ABNORMAL HIGH (ref 1.7–2.4)
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
## 2016-01-21 ENCOUNTER — Inpatient Hospital Stay (HOSPITAL_COMMUNITY): Payer: Medicaid Other

## 2016-01-21 LAB — RENAL FUNCTION PANEL
Albumin: 1.8 g/dL — ABNORMAL LOW (ref 3.5–5.0)
Albumin: 1.7 g/dL — ABNORMAL LOW (ref 3.5–5.0)
Anion gap: 16 — ABNORMAL HIGH (ref 5–15)
Anion gap: 7 (ref 5–15)
BUN: 51 mg/dL — ABNORMAL HIGH (ref 6–20)
BUN: 45 mg/dL — ABNORMAL HIGH (ref 6–20)
CO2: 27 mmol/L (ref 22–32)
CO2: 24 mmol/L (ref 22–32)
Calcium: 10 mg/dL (ref 8.9–10.3)
Calcium: 8.6 mg/dL — ABNORMAL LOW (ref 8.9–10.3)
Chloride: 94 mmol/L — ABNORMAL LOW (ref 101–111)
Chloride: 103 mmol/L (ref 101–111)
Creatinine, Ser: 3.4 mg/dL — ABNORMAL HIGH (ref 0.61–1.24)
Creatinine, Ser: 3.17 mg/dL — ABNORMAL HIGH (ref 0.61–1.24)
GFR calc Af Amer: 28 mL/min — ABNORMAL LOW (ref >60)
GFR calc Af Amer: 31 mL/min — ABNORMAL LOW (ref >60)
GFR calc non Af Amer: 24 mL/min — ABNORMAL LOW (ref >60)
GFR calc non Af Amer: 26 mL/min — ABNORMAL LOW (ref >60)
Glucose, Bld: 95 mg/dL (ref 65–99)
Glucose, Bld: 98 mg/dL (ref 65–99)
Phosphorus: 4.8 mg/dL — ABNORMAL HIGH (ref 2.5–4.6)
Phosphorus: 6.4 mg/dL — ABNORMAL HIGH (ref 2.5–4.6)
Potassium: 4.6 mmol/L (ref 3.5–5.1)
Potassium: 4.6 mmol/L (ref 3.5–5.1)
Sodium: 137 mmol/L (ref 135–145)
Sodium: 134 mmol/L — ABNORMAL LOW (ref 135–145)

## 2016-01-21 LAB — CBC
HCT: 25.9 % — ABNORMAL LOW (ref 39.0–52.0)
Hemoglobin: 8.1 g/dL — ABNORMAL LOW (ref 13.0–17.0)
MCH: 28.7 pg (ref 26.0–34.0)
MCHC: 31.3 g/dL (ref 30.0–36.0)
MCV: 91.8 fL (ref 78.0–100.0)
Platelets: 286 10*3/uL (ref 150–400)
RBC: 2.82 MIL/uL — ABNORMAL LOW (ref 4.22–5.81)
RDW: 17.4 % — ABNORMAL HIGH (ref 11.5–15.5)
WBC: 25.5 10*3/uL — ABNORMAL HIGH (ref 4.0–10.5)

## 2016-01-21 LAB — BLOOD GAS, ARTERIAL
Acid-Base Excess: 1.7 mmol/L (ref 0.0–2.0)
Bicarbonate: 27 mEq/L — ABNORMAL HIGH (ref 20.0–24.0)
Drawn by: 23604
FIO2: 40
MECHVT: 660 mL
O2 Saturation: 99.7 %
PEEP: 5 cmH2O
Patient temperature: 98.6
RATE: 15 resp/min
TCO2: 28.6 mmol/L (ref 0–100)
pCO2 arterial: 52 mmHg — ABNORMAL HIGH (ref 35.0–45.0)
pH, Arterial: 7.335 — ABNORMAL LOW (ref 7.350–7.450)
pO2, Arterial: 285 mmHg — ABNORMAL HIGH (ref 80.0–100.0)

## 2016-01-21 LAB — POCT ACTIVATED CLOTTING TIME
Activated Clotting Time: 180 seconds
Activated Clotting Time: 186 seconds
Activated Clotting Time: 186 seconds
Activated Clotting Time: 186 seconds
Activated Clotting Time: 191 seconds
Activated Clotting Time: 191 seconds
Activated Clotting Time: 202 seconds
Activated Clotting Time: 202 seconds

## 2016-01-21 LAB — BASIC METABOLIC PANEL
Anion gap: 17 — ABNORMAL HIGH (ref 5–15)
BUN: 52 mg/dL — ABNORMAL HIGH (ref 6–20)
CO2: 26 mmol/L (ref 22–32)
Calcium: 10 mg/dL (ref 8.9–10.3)
Chloride: 94 mmol/L — ABNORMAL LOW (ref 101–111)
Creatinine, Ser: 3.41 mg/dL — ABNORMAL HIGH (ref 0.61–1.24)
GFR calc Af Amer: 28 mL/min — ABNORMAL LOW (ref >60)
GFR calc non Af Amer: 24 mL/min — ABNORMAL LOW (ref >60)
Glucose, Bld: 96 mg/dL (ref 65–99)
Potassium: 4.6 mmol/L (ref 3.5–5.1)
Sodium: 137 mmol/L (ref 135–145)

## 2016-01-21 LAB — TRIGLYCERIDES: Triglycerides: 261 mg/dL — ABNORMAL HIGH (ref <150)

## 2016-01-21 LAB — GLUCOSE, CAPILLARY
Glucose-Capillary: 100 mg/dL — ABNORMAL HIGH (ref 65–99)
Glucose-Capillary: 103 mg/dL — ABNORMAL HIGH (ref 65–99)
Glucose-Capillary: 89 mg/dL (ref 65–99)
Glucose-Capillary: 91 mg/dL (ref 65–99)
Glucose-Capillary: 92 mg/dL (ref 65–99)
Glucose-Capillary: 96 mg/dL (ref 65–99)

## 2016-01-21 LAB — MAGNESIUM: Magnesium: 2.7 mg/dL — ABNORMAL HIGH (ref 1.7–2.4)

## 2016-01-21 LAB — APTT: aPTT: 90 seconds — ABNORMAL HIGH (ref 24–37)
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
## 2016-01-22 ENCOUNTER — Inpatient Hospital Stay (HOSPITAL_COMMUNITY): Payer: Medicaid Other

## 2016-01-22 LAB — CBC WITH DIFFERENTIAL/PLATELET
Basophils Absolute: 0 10*3/uL (ref 0.0–0.1)
Basophils Relative: 0 %
Eosinophils Absolute: 0.6 10*3/uL (ref 0.0–0.7)
Eosinophils Relative: 2 %
HCT: 25.4 % — ABNORMAL LOW (ref 39.0–52.0)
Hemoglobin: 7.8 g/dL — ABNORMAL LOW (ref 13.0–17.0)
Lymphocytes Relative: 5 %
Lymphs Abs: 1.4 10*3/uL (ref 0.7–4.0)
MCH: 29.1 pg (ref 26.0–34.0)
MCHC: 30.7 g/dL (ref 30.0–36.0)
MCV: 94.8 fL (ref 78.0–100.0)
Monocytes Absolute: 1.9 10*3/uL — ABNORMAL HIGH (ref 0.1–1.0)
Monocytes Relative: 7 %
Neutro Abs: 23.6 10*3/uL — ABNORMAL HIGH (ref 1.7–7.7)
Neutrophils Relative %: 86 %
Platelets: 289 10*3/uL (ref 150–400)
RBC: 2.68 MIL/uL — ABNORMAL LOW (ref 4.22–5.81)
RDW: 17.5 % — ABNORMAL HIGH (ref 11.5–15.5)
WBC: 27.5 10*3/uL — ABNORMAL HIGH (ref 4.0–10.5)

## 2016-01-22 LAB — RENAL FUNCTION PANEL
Albumin: 1.8 g/dL — ABNORMAL LOW (ref 3.5–5.0)
Albumin: 1.7 g/dL — ABNORMAL LOW (ref 3.5–5.0)
Anion gap: 6 (ref 5–15)
Anion gap: 7 (ref 5–15)
BUN: 55 mg/dL — ABNORMAL HIGH (ref 6–20)
BUN: 56 mg/dL — ABNORMAL HIGH (ref 6–20)
CO2: 27 mmol/L (ref 22–32)
CO2: 25 mmol/L (ref 22–32)
Calcium: 9.2 mg/dL (ref 8.9–10.3)
Calcium: 9.1 mg/dL (ref 8.9–10.3)
Chloride: 100 mmol/L — ABNORMAL LOW (ref 101–111)
Chloride: 102 mmol/L (ref 101–111)
Creatinine, Ser: 3.52 mg/dL — ABNORMAL HIGH (ref 0.61–1.24)
Creatinine, Ser: 3.6 mg/dL — ABNORMAL HIGH (ref 0.61–1.24)
GFR calc Af Amer: 27 mL/min — ABNORMAL LOW (ref >60)
GFR calc Af Amer: 26 mL/min — ABNORMAL LOW (ref >60)
GFR calc non Af Amer: 23 mL/min — ABNORMAL LOW (ref >60)
GFR calc non Af Amer: 23 mL/min — ABNORMAL LOW (ref >60)
Glucose, Bld: 104 mg/dL — ABNORMAL HIGH (ref 65–99)
Glucose, Bld: 96 mg/dL (ref 65–99)
Phosphorus: 5.8 mg/dL — ABNORMAL HIGH (ref 2.5–4.6)
Phosphorus: 5.5 mg/dL — ABNORMAL HIGH (ref 2.5–4.6)
Potassium: 5.2 mmol/L — ABNORMAL HIGH (ref 3.5–5.1)
Potassium: 4.5 mmol/L (ref 3.5–5.1)
Sodium: 133 mmol/L — ABNORMAL LOW (ref 135–145)
Sodium: 134 mmol/L — ABNORMAL LOW (ref 135–145)

## 2016-01-22 LAB — GLUCOSE, CAPILLARY
Glucose-Capillary: 105 mg/dL — ABNORMAL HIGH (ref 65–99)
Glucose-Capillary: 97 mg/dL (ref 65–99)
Glucose-Capillary: 87 mg/dL (ref 65–99)
Glucose-Capillary: 83 mg/dL (ref 65–99)
Glucose-Capillary: 105 mg/dL — ABNORMAL HIGH (ref 65–99)

## 2016-01-22 LAB — POCT ACTIVATED CLOTTING TIME
Activated Clotting Time: 191 seconds
Activated Clotting Time: 191 seconds
Activated Clotting Time: 191 seconds
Activated Clotting Time: 191 seconds
Activated Clotting Time: 197 seconds
Activated Clotting Time: 197 seconds
Activated Clotting Time: 208 seconds
Activated Clotting Time: 191 seconds
Activated Clotting Time: 191 seconds
Activated Clotting Time: 186 seconds
Activated Clotting Time: 191 seconds
Activated Clotting Time: 191 seconds
Activated Clotting Time: 197 seconds

## 2016-01-22 LAB — MAGNESIUM: Magnesium: 2.9 mg/dL — ABNORMAL HIGH (ref 1.7–2.4)

## 2016-01-22 LAB — APTT: aPTT: 108 seconds — ABNORMAL HIGH (ref 24–37)

## 2016-01-22 MED ORDER — IPRATROPIUM-ALBUTEROL 0.5-2.5 (3) MG/3ML IN SOLN: 3.0000 mL | RESPIRATORY_TRACT | Status: DC | PRN
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
## 2016-01-23 LAB — GLUCOSE, CAPILLARY
Glucose-Capillary: 107 mg/dL — ABNORMAL HIGH (ref 65–99)
Glucose-Capillary: 107 mg/dL — ABNORMAL HIGH (ref 65–99)
Glucose-Capillary: 85 mg/dL (ref 65–99)
Glucose-Capillary: 89 mg/dL (ref 65–99)
Glucose-Capillary: 92 mg/dL (ref 65–99)
Glucose-Capillary: 95 mg/dL (ref 65–99)
Glucose-Capillary: 96 mg/dL (ref 65–99)
Glucose-Capillary: 98 mg/dL (ref 65–99)

## 2016-01-23 LAB — RENAL FUNCTION PANEL
Albumin: 1.8 g/dL — ABNORMAL LOW (ref 3.5–5.0)
Albumin: 1.9 g/dL — ABNORMAL LOW (ref 3.5–5.0)
Anion gap: 8 (ref 5–15)
Anion gap: 5 (ref 5–15)
BUN: 50 mg/dL — ABNORMAL HIGH (ref 6–20)
BUN: 47 mg/dL — ABNORMAL HIGH (ref 6–20)
CO2: 27 mmol/L (ref 22–32)
CO2: 27 mmol/L (ref 22–32)
Calcium: 9.1 mg/dL (ref 8.9–10.3)
Calcium: 9 mg/dL (ref 8.9–10.3)
Chloride: 100 mmol/L — ABNORMAL LOW (ref 101–111)
Chloride: 100 mmol/L — ABNORMAL LOW (ref 101–111)
Creatinine, Ser: 3.08 mg/dL — ABNORMAL HIGH (ref 0.61–1.24)
Creatinine, Ser: 2.92 mg/dL — ABNORMAL HIGH (ref 0.61–1.24)
GFR calc Af Amer: 32 mL/min — ABNORMAL LOW (ref >60)
GFR calc Af Amer: 34 mL/min — ABNORMAL LOW (ref >60)
GFR calc non Af Amer: 27 mL/min — ABNORMAL LOW (ref >60)
GFR calc non Af Amer: 29 mL/min — ABNORMAL LOW (ref >60)
Glucose, Bld: 103 mg/dL — ABNORMAL HIGH (ref 65–99)
Glucose, Bld: 101 mg/dL — ABNORMAL HIGH (ref 65–99)
Phosphorus: 4.5 mg/dL (ref 2.5–4.6)
Phosphorus: 3.8 mg/dL (ref 2.5–4.6)
Potassium: 4.8 mmol/L (ref 3.5–5.1)
Potassium: 5.7 mmol/L — ABNORMAL HIGH (ref 3.5–5.1)
Sodium: 135 mmol/L (ref 135–145)
Sodium: 132 mmol/L — ABNORMAL LOW (ref 135–145)

## 2016-01-23 LAB — POCT I-STAT 3, ART BLOOD GAS (G3+)
Acid-Base Excess: 3 mmol/L — ABNORMAL HIGH (ref 0.0–2.0)
Bicarbonate: 29.5 mEq/L — ABNORMAL HIGH (ref 20.0–24.0)
O2 Saturation: 96 %
TCO2: 31 mmol/L (ref 0–100)
pCO2 arterial: 53.3 mmHg — ABNORMAL HIGH (ref 35.0–45.0)
pH, Arterial: 7.351 (ref 7.350–7.450)
pO2, Arterial: 85 mmHg (ref 80.0–100.0)

## 2016-01-23 LAB — MAGNESIUM: Magnesium: 2.8 mg/dL — ABNORMAL HIGH (ref 1.7–2.4)

## 2016-01-23 LAB — POCT ACTIVATED CLOTTING TIME
Activated Clotting Time: 191 seconds
Activated Clotting Time: 191 seconds
Activated Clotting Time: 202 seconds
Activated Clotting Time: 191 seconds
Activated Clotting Time: 208 seconds
Activated Clotting Time: 213 seconds
Activated Clotting Time: 202 seconds

## 2016-01-23 LAB — FERRITIN: Ferritin: 337 ng/mL — ABNORMAL HIGH (ref 24–336)

## 2016-01-23 LAB — IRON AND TIBC
Iron: 29 ug/dL — ABNORMAL LOW (ref 45–182)
Saturation Ratios: 12 % — ABNORMAL LOW (ref 17.9–39.5)
TIBC: 252 ug/dL (ref 250–450)
UIBC: 223 ug/dL

## 2016-01-23 LAB — APTT: aPTT: 110 seconds — ABNORMAL HIGH (ref 24–37)

## 2016-01-23 MED ORDER — PRISMASOL BGK 0/2.5 32-2.5 MEQ/L IV SOLN
INTRAVENOUS | Status: DC
Start: 2016-01-23 — End: 2016-01-27
  Administered 2016-01-23 – 2016-01-24 (×8): via INTRAVENOUS_CENTRAL
  Filled 2016-01-23 (×42): qty 5000

## 2016-01-23 MED ORDER — DIAZEPAM 5 MG/ML IJ SOLN
5.0000 mg | Freq: Four times a day (QID) | INTRAMUSCULAR | Status: DC
  Administered 2016-01-23 – 2016-01-24 (×6): 5 mg via INTRAVENOUS
  Filled 2016-01-23 (×6): qty 2

## 2016-01-23 MED ORDER — HALOPERIDOL LACTATE 5 MG/ML IJ SOLN
5.0000 mg | Freq: Four times a day (QID) | INTRAMUSCULAR | Status: DC | PRN
  Administered 2016-01-23: 5 mg via INTRAVENOUS
  Filled 2016-01-23: qty 1

## 2016-01-23 MED ORDER — FAMOTIDINE IN NACL 20-0.9 MG/50ML-% IV SOLN
20.0000 mg | Freq: Two times a day (BID) | INTRAVENOUS | Status: DC
  Administered 2016-01-23 – 2016-01-24 (×3): 20 mg via INTRAVENOUS
  Filled 2016-01-23 (×3): qty 50

## 2016-01-23 MED ORDER — FENTANYL CITRATE (PF) 100 MCG/2ML IJ SOLN
50.0000 ug | INTRAMUSCULAR | Status: DC | PRN
  Administered 2016-01-23 – 2016-01-25 (×8): 100 ug via INTRAVENOUS
  Administered 2016-01-26: 50 ug via INTRAVENOUS
  Administered 2016-01-26: 100 ug via INTRAVENOUS
  Administered 2016-01-26: 50 ug via INTRAVENOUS
  Administered 2016-01-26: 100 ug via INTRAVENOUS
  Administered 2016-01-27: 50 ug via INTRAVENOUS
  Filled 2016-01-23 (×11): qty 2

## 2016-01-23 MED ORDER — CHLORHEXIDINE GLUCONATE 0.12 % MT SOLN
OROMUCOSAL | Status: AC
  Filled 2016-01-23: qty 15

## 2016-01-23 MED ORDER — PRISMASOL BGK 0/2.5 32-2.5 MEQ/L IV SOLN
INTRAVENOUS | Status: DC
  Administered 2016-01-23 – 2016-01-24 (×2): via INTRAVENOUS_CENTRAL
  Filled 2016-01-23 (×11): qty 5000

## 2016-01-23 MED ORDER — DOUBLE ANTIBIOTIC 500-10000 UNIT/GM EX OINT
TOPICAL_OINTMENT | Freq: Two times a day (BID) | CUTANEOUS | Status: DC
  Administered 2016-01-23 (×2): via TOPICAL
  Administered 2016-01-24: 1 via TOPICAL
  Administered 2016-01-25 – 2016-01-26 (×3): via TOPICAL
  Administered 2016-01-26: 1 via TOPICAL
  Administered 2016-01-27 – 2016-01-30 (×5): via TOPICAL
  Administered 2016-01-30: 1 via TOPICAL
  Administered 2016-01-31 (×2): via TOPICAL
  Filled 2016-01-23 (×11): qty 1
  Filled 2016-01-23: qty 14.17
  Filled 2016-01-23 (×5): qty 1

## 2016-01-23 MED ORDER — FENTANYL 50 MCG/HR TD PT72: 100.0000 ug | MEDICATED_PATCH | TRANSDERMAL | Status: DC

## 2016-01-23 MED ORDER — LORAZEPAM 2 MG/ML IJ SOLN
1.0000 mg | INTRAMUSCULAR | Status: DC | PRN
  Administered 2016-01-23: 1 mg via INTRAVENOUS
  Filled 2016-01-23: qty 1

## 2016-01-23 NOTE — Procedures (Addendum)
Extubation Procedure Note  Patient Details:   Name: Samuel Owens DOB: [birth year] MRN: [id number]   Airway Documentation:  Airway 8 mm (Active)  Secured at (cm) 26 cm 01/23/2016  8:10 AM  Measured From Lips 01/23/2016  8:10 AM  Secured Location Right 01/23/2016  8:10 AM  Secured By [employer] Tube Holder 01/23/2016  8:10 AM  Tube Holder Repositioned Yes 01/23/2016  8:10 AM  Cuff Pressure (cm H2O) 26 cm H2O 01/22/2016  7:19 PM  Site Condition Dry 01/23/2016  8:10 AM    Evaluation  O2 sats: stable throughout Complications: No apparent complications Patient did tolerate procedure well. Bilateral Breath Sounds: Clear, Diminished   Yes   Pt extubated per MD order.  Pt tolerated well.  Pt on 4l [name] with sats of 100.  RT will continue to monitor.  Pt had + cuff leak prior to extubation  Ronny FlurryMorgan B Aljean Horiuchi 01/23/2016, 10:40 AM

## 2016-01-23 NOTE — Progress Notes (Signed)
Wasted 200 ml of fentanyl in the sink. Witnessed by Docia BarrierJosh McDaniel, RN
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
## 2016-01-23 NOTE — Progress Notes (Signed)
Pt had + cuff leak.

## 2016-01-23 NOTE — Progress Notes (Signed)
C diff antigen positive, however PCR was negative. Enteric precautions discontinued per new protocol effective 01/23/2016 since PCR negative

## 2016-01-23 NOTE — Progress Notes (Signed)
Patients 5pm K 5.7.  Notified nephrology. Dr Juel BurrowLin placed new orders to change K bath to prismasol 0K/2.5 solution.  Orders carried out.  Darrelle Wiberg, [place] Marie
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
## 2016-01-24 LAB — CBC WITH DIFFERENTIAL/PLATELET
Basophils Absolute: 0 10*3/uL (ref 0.0–0.1)
Basophils Relative: 0 %
Eosinophils Absolute: 0.3 10*3/uL (ref 0.0–0.7)
Eosinophils Relative: 1 %
HCT: 24.7 % — ABNORMAL LOW (ref 39.0–52.0)
Hemoglobin: 7.7 g/dL — ABNORMAL LOW (ref 13.0–17.0)
Lymphocytes Relative: 7 %
Lymphs Abs: 1.8 10*3/uL (ref 0.7–4.0)
MCH: 28.6 pg (ref 26.0–34.0)
MCHC: 31.2 g/dL (ref 30.0–36.0)
MCV: 91.8 fL (ref 78.0–100.0)
Monocytes Absolute: 2 10*3/uL — ABNORMAL HIGH (ref 0.1–1.0)
Monocytes Relative: 8 %
Neutro Abs: 21 10*3/uL — ABNORMAL HIGH (ref 1.7–7.7)
Neutrophils Relative %: 84 %
Platelets: 327 10*3/uL (ref 150–400)
RBC: 2.69 MIL/uL — ABNORMAL LOW (ref 4.22–5.81)
RDW: 17.2 % — ABNORMAL HIGH (ref 11.5–15.5)
WBC: 25.1 10*3/uL — ABNORMAL HIGH (ref 4.0–10.5)

## 2016-01-24 LAB — RENAL FUNCTION PANEL
Albumin: 1.9 g/dL — ABNORMAL LOW (ref 3.5–5.0)
Albumin: 1.9 g/dL — ABNORMAL LOW (ref 3.5–5.0)
Anion gap: 9 (ref 5–15)
Anion gap: 6 (ref 5–15)
BUN: 39 mg/dL — ABNORMAL HIGH (ref 6–20)
BUN: 39 mg/dL — ABNORMAL HIGH (ref 6–20)
CO2: 26 mmol/L (ref 22–32)
CO2: 28 mmol/L (ref 22–32)
Calcium: 8.8 mg/dL — ABNORMAL LOW (ref 8.9–10.3)
Calcium: 8.7 mg/dL — ABNORMAL LOW (ref 8.9–10.3)
Chloride: 99 mmol/L — ABNORMAL LOW (ref 101–111)
Chloride: 97 mmol/L — ABNORMAL LOW (ref 101–111)
Creatinine, Ser: 2.7 mg/dL — ABNORMAL HIGH (ref 0.61–1.24)
Creatinine, Ser: 2.89 mg/dL — ABNORMAL HIGH (ref 0.61–1.24)
GFR calc Af Amer: 37 mL/min — ABNORMAL LOW (ref >60)
GFR calc Af Amer: 34 mL/min — ABNORMAL LOW (ref >60)
GFR calc non Af Amer: 32 mL/min — ABNORMAL LOW (ref >60)
GFR calc non Af Amer: 29 mL/min — ABNORMAL LOW (ref >60)
Glucose, Bld: 103 mg/dL — ABNORMAL HIGH (ref 65–99)
Glucose, Bld: 100 mg/dL — ABNORMAL HIGH (ref 65–99)
Phosphorus: 4 mg/dL (ref 2.5–4.6)
Phosphorus: 4.2 mg/dL (ref 2.5–4.6)
Potassium: 4 mmol/L (ref 3.5–5.1)
Potassium: 3.4 mmol/L — ABNORMAL LOW (ref 3.5–5.1)
Sodium: 134 mmol/L — ABNORMAL LOW (ref 135–145)
Sodium: 131 mmol/L — ABNORMAL LOW (ref 135–145)

## 2016-01-24 LAB — MAGNESIUM: Magnesium: 2.8 mg/dL — ABNORMAL HIGH (ref 1.7–2.4)

## 2016-01-24 LAB — GLUCOSE, CAPILLARY
Glucose-Capillary: 100 mg/dL — ABNORMAL HIGH (ref 65–99)
Glucose-Capillary: 102 mg/dL — ABNORMAL HIGH (ref 65–99)
Glucose-Capillary: 81 mg/dL (ref 65–99)
Glucose-Capillary: 91 mg/dL (ref 65–99)
Glucose-Capillary: 93 mg/dL (ref 65–99)
Glucose-Capillary: 94 mg/dL (ref 65–99)

## 2016-01-24 LAB — POCT ACTIVATED CLOTTING TIME
Activated Clotting Time: 202 seconds
Activated Clotting Time: 208 seconds
Activated Clotting Time: 208 seconds
Activated Clotting Time: 208 seconds
Activated Clotting Time: 213 seconds
Activated Clotting Time: 213 seconds
Activated Clotting Time: 219 seconds
Activated Clotting Time: 219 seconds
Activated Clotting Time: 219 seconds
Activated Clotting Time: 230 seconds
Activated Clotting Time: 230 seconds
Activated Clotting Time: 230 seconds

## 2016-01-24 LAB — APTT: aPTT: 124 seconds — ABNORMAL HIGH (ref 24–37)

## 2016-01-24 MED ORDER — SODIUM CHLORIDE 0.9 % IV SOLN
125.0000 mg | Freq: Every day | INTRAVENOUS | Status: AC
  Administered 2016-01-24 – 2016-01-28 (×3): 125 mg via INTRAVENOUS
  Filled 2016-01-24 (×7): qty 10
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
## 2016-01-25 LAB — BASIC METABOLIC PANEL
Anion gap: 10 (ref 5–15)
BUN: 60 mg/dL — ABNORMAL HIGH (ref 6–20)
CO2: 25 mmol/L (ref 22–32)
Calcium: 9.2 mg/dL (ref 8.9–10.3)
Chloride: 93 mmol/L — ABNORMAL LOW (ref 101–111)
Creatinine, Ser: 4.56 mg/dL — ABNORMAL HIGH (ref 0.61–1.24)
GFR calc Af Amer: 20 mL/min — ABNORMAL LOW (ref >60)
GFR calc non Af Amer: 17 mL/min — ABNORMAL LOW (ref >60)
Glucose, Bld: 89 mg/dL (ref 65–99)
Potassium: 3.6 mmol/L (ref 3.5–5.1)
Sodium: 128 mmol/L — ABNORMAL LOW (ref 135–145)

## 2016-01-25 LAB — RENAL FUNCTION PANEL
Albumin: 1.9 g/dL — ABNORMAL LOW (ref 3.5–5.0)
Albumin: 2 g/dL — ABNORMAL LOW (ref 3.5–5.0)
Anion gap: 10 (ref 5–15)
Anion gap: 12 (ref 5–15)
BUN: 60 mg/dL — ABNORMAL HIGH (ref 6–20)
BUN: 73 mg/dL — ABNORMAL HIGH (ref 6–20)
CO2: 25 mmol/L (ref 22–32)
CO2: 21 mmol/L — ABNORMAL LOW (ref 22–32)
Calcium: 9.1 mg/dL (ref 8.9–10.3)
Calcium: 8.8 mg/dL — ABNORMAL LOW (ref 8.9–10.3)
Chloride: 92 mmol/L — ABNORMAL LOW (ref 101–111)
Chloride: 92 mmol/L — ABNORMAL LOW (ref 101–111)
Creatinine, Ser: 4.58 mg/dL — ABNORMAL HIGH (ref 0.61–1.24)
Creatinine, Ser: 5.58 mg/dL — ABNORMAL HIGH (ref 0.61–1.24)
GFR calc Af Amer: 20 mL/min — ABNORMAL LOW (ref >60)
GFR calc Af Amer: 15 mL/min — ABNORMAL LOW (ref >60)
GFR calc non Af Amer: 17 mL/min — ABNORMAL LOW (ref >60)
GFR calc non Af Amer: 13 mL/min — ABNORMAL LOW (ref >60)
Glucose, Bld: 91 mg/dL (ref 65–99)
Glucose, Bld: 100 mg/dL — ABNORMAL HIGH (ref 65–99)
Phosphorus: 6.2 mg/dL — ABNORMAL HIGH (ref 2.5–4.6)
Phosphorus: 6.1 mg/dL — ABNORMAL HIGH (ref 2.5–4.6)
Potassium: 3.6 mmol/L (ref 3.5–5.1)
Potassium: 3.5 mmol/L (ref 3.5–5.1)
Sodium: 127 mmol/L — ABNORMAL LOW (ref 135–145)
Sodium: 125 mmol/L — ABNORMAL LOW (ref 135–145)

## 2016-01-25 LAB — GLUCOSE, CAPILLARY
Glucose-Capillary: 101 mg/dL — ABNORMAL HIGH (ref 65–99)
Glucose-Capillary: 106 mg/dL — ABNORMAL HIGH (ref 65–99)
Glucose-Capillary: 121 mg/dL — ABNORMAL HIGH (ref 65–99)
Glucose-Capillary: 125 mg/dL — ABNORMAL HIGH (ref 65–99)

## 2016-01-25 LAB — CBC
HCT: 23.9 % — ABNORMAL LOW (ref 39.0–52.0)
Hemoglobin: 7.6 g/dL — ABNORMAL LOW (ref 13.0–17.0)
MCH: 28.6 pg (ref 26.0–34.0)
MCHC: 31.8 g/dL (ref 30.0–36.0)
MCV: 89.8 fL (ref 78.0–100.0)
Platelets: 343 10*3/uL (ref 150–400)
RBC: 2.66 MIL/uL — ABNORMAL LOW (ref 4.22–5.81)
RDW: 16.4 % — ABNORMAL HIGH (ref 11.5–15.5)
WBC: 22 10*3/uL — ABNORMAL HIGH (ref 4.0–10.5)

## 2016-01-25 LAB — MAGNESIUM: Magnesium: 2.8 mg/dL — ABNORMAL HIGH (ref 1.7–2.4)

## 2016-01-25 LAB — APTT: aPTT: 40 seconds — ABNORMAL HIGH (ref 24–37)

## 2016-01-25 MED ORDER — ENOXAPARIN SODIUM 30 MG/0.3ML ~~LOC~~ SOLN
30.0000 mg | SUBCUTANEOUS | Status: DC
Start: 2016-01-26 — End: 2016-01-27
  Administered 2016-01-26 – 2016-01-27 (×2): 30 mg via SUBCUTANEOUS
  Filled 2016-01-25 (×2): qty 0.3

## 2016-01-25 MED ORDER — DIAZEPAM 5 MG/ML IJ SOLN
5.0000 mg | Freq: Three times a day (TID) | INTRAMUSCULAR | Status: DC
  Administered 2016-01-25 – 2016-01-28 (×7): 5 mg via INTRAVENOUS
  Filled 2016-01-25 (×7): qty 2

## 2016-01-25 MED ORDER — FENTANYL 25 MCG/HR TD PT72
50.0000 ug | MEDICATED_PATCH | TRANSDERMAL | Status: DC
  Administered 2016-01-26: 50 ug via TRANSDERMAL
  Filled 2016-01-25: qty 2

## 2016-01-25 MED ORDER — FAMOTIDINE 20 MG PO TABS
20.0000 mg | ORAL_TABLET | Freq: Every day | ORAL | Status: DC
  Administered 2016-01-25 – 2016-02-06 (×12): 20 mg via ORAL
  Filled 2016-01-25 (×15): qty 1

## 2016-01-25 MED ORDER — BOOST / RESOURCE BREEZE PO LIQD
1.0000 | Freq: Three times a day (TID) | ORAL | Status: DC
  Administered 2016-01-28 – 2016-01-30 (×5): 1 via ORAL

## 2016-01-25 NOTE — Progress Notes (Signed)
Patient resting quietly with family at bedside. Vital signs are stable, no complaint of pain, no nausea or vomitting. Patient able to do for him self concerning eating and moving in the bed, he just needs encouragement. No questions or concerns at this time. Call bell in reach, will continue to monitor.
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
## 2016-01-26 LAB — RENAL FUNCTION PANEL
Albumin: 2 g/dL — ABNORMAL LOW (ref 3.5–5.0)
Albumin: 2 g/dL — ABNORMAL LOW (ref 3.5–5.0)
Anion gap: 12 (ref 5–15)
Anion gap: 10 (ref 5–15)
BUN: 84 mg/dL — ABNORMAL HIGH (ref 6–20)
BUN: 58 mg/dL — ABNORMAL HIGH (ref 6–20)
CO2: 22 mmol/L (ref 22–32)
CO2: 21 mmol/L — ABNORMAL LOW (ref 22–32)
Calcium: 8.8 mg/dL — ABNORMAL LOW (ref 8.9–10.3)
Calcium: 8.5 mg/dL — ABNORMAL LOW (ref 8.9–10.3)
Chloride: 89 mmol/L — ABNORMAL LOW (ref 101–111)
Chloride: 95 mmol/L — ABNORMAL LOW (ref 101–111)
Creatinine, Ser: 6.68 mg/dL — ABNORMAL HIGH (ref 0.61–1.24)
Creatinine, Ser: 5.66 mg/dL — ABNORMAL HIGH (ref 0.61–1.24)
GFR calc Af Amer: 12 mL/min — ABNORMAL LOW (ref >60)
GFR calc Af Amer: 15 mL/min — ABNORMAL LOW (ref >60)
GFR calc non Af Amer: 11 mL/min — ABNORMAL LOW (ref >60)
GFR calc non Af Amer: 13 mL/min — ABNORMAL LOW (ref >60)
Glucose, Bld: 100 mg/dL — ABNORMAL HIGH (ref 65–99)
Glucose, Bld: 127 mg/dL — ABNORMAL HIGH (ref 65–99)
Phosphorus: 6 mg/dL — ABNORMAL HIGH (ref 2.5–4.6)
Phosphorus: 4 mg/dL (ref 2.5–4.6)
Potassium: 3.5 mmol/L (ref 3.5–5.1)
Potassium: 3.3 mmol/L — ABNORMAL LOW (ref 3.5–5.1)
Sodium: 123 mmol/L — ABNORMAL LOW (ref 135–145)
Sodium: 126 mmol/L — ABNORMAL LOW (ref 135–145)

## 2016-01-26 LAB — BASIC METABOLIC PANEL
Anion gap: 13 (ref 5–15)
BUN: 85 mg/dL — ABNORMAL HIGH (ref 6–20)
CO2: 21 mmol/L — ABNORMAL LOW (ref 22–32)
Calcium: 8.8 mg/dL — ABNORMAL LOW (ref 8.9–10.3)
Chloride: 88 mmol/L — ABNORMAL LOW (ref 101–111)
Creatinine, Ser: 6.77 mg/dL — ABNORMAL HIGH (ref 0.61–1.24)
GFR calc Af Amer: 12 mL/min — ABNORMAL LOW (ref >60)
GFR calc non Af Amer: 11 mL/min — ABNORMAL LOW (ref >60)
Glucose, Bld: 99 mg/dL (ref 65–99)
Potassium: 3.4 mmol/L — ABNORMAL LOW (ref 3.5–5.1)
Sodium: 122 mmol/L — ABNORMAL LOW (ref 135–145)

## 2016-01-26 LAB — CBC WITH DIFFERENTIAL/PLATELET
Basophils Absolute: 0 10*3/uL (ref 0.0–0.1)
Basophils Relative: 0 %
Eosinophils Absolute: 0.2 10*3/uL (ref 0.0–0.7)
Eosinophils Relative: 1 %
HCT: 22.5 % — ABNORMAL LOW (ref 39.0–52.0)
Hemoglobin: 7.6 g/dL — ABNORMAL LOW (ref 13.0–17.0)
Lymphocytes Relative: 12 %
Lymphs Abs: 3 10*3/uL (ref 0.7–4.0)
MCH: 29.2 pg (ref 26.0–34.0)
MCHC: 33.8 g/dL (ref 30.0–36.0)
MCV: 86.5 fL (ref 78.0–100.0)
Monocytes Absolute: 1.5 10*3/uL — ABNORMAL HIGH (ref 0.1–1.0)
Monocytes Relative: 6 %
Neutro Abs: 20 10*3/uL — ABNORMAL HIGH (ref 1.7–7.7)
Neutrophils Relative %: 81 %
Platelets: 357 10*3/uL (ref 150–400)
RBC: 2.6 MIL/uL — ABNORMAL LOW (ref 4.22–5.81)
RDW: 15.7 % — ABNORMAL HIGH (ref 11.5–15.5)
WBC: 24.7 10*3/uL — ABNORMAL HIGH (ref 4.0–10.5)

## 2016-01-26 LAB — GLUCOSE, CAPILLARY
Glucose-Capillary: 101 mg/dL — ABNORMAL HIGH (ref 65–99)
Glucose-Capillary: 106 mg/dL — ABNORMAL HIGH (ref 65–99)
Glucose-Capillary: 108 mg/dL — ABNORMAL HIGH (ref 65–99)
Glucose-Capillary: 112 mg/dL — ABNORMAL HIGH (ref 65–99)
Glucose-Capillary: 116 mg/dL — ABNORMAL HIGH (ref 65–99)
Glucose-Capillary: 125 mg/dL — ABNORMAL HIGH (ref 65–99)

## 2016-01-26 LAB — MAGNESIUM: Magnesium: 2.6 mg/dL — ABNORMAL HIGH (ref 1.7–2.4)

## 2016-01-26 LAB — APTT: aPTT: 38 seconds — ABNORMAL HIGH (ref 24–37)

## 2016-01-26 MED ORDER — ALTEPLASE 2 MG IJ SOLR
2.0000 mg | Freq: Once | INTRAMUSCULAR | Status: AC
  Administered 2016-01-26: 2 mg
  Filled 2016-01-26: qty 2

## 2016-01-26 MED ORDER — FENTANYL CITRATE (PF) 100 MCG/2ML IJ SOLN
INTRAMUSCULAR | Status: AC
  Administered 2016-01-26: 50 ug via INTRAVENOUS
  Filled 2016-01-26: qty 2

## 2016-01-26 MED ORDER — ALTEPLASE 2 MG IJ SOLR
2.0000 mg | Freq: Once | INTRAMUSCULAR | Status: AC
Start: 2016-01-26 — End: 2016-01-26
  Administered 2016-01-26: 2 mg
  Filled 2016-01-26 (×2): qty 2
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
## 2016-01-27 LAB — CBC
HCT: 21.6 % — ABNORMAL LOW (ref 39.0–52.0)
Hemoglobin: 7.3 g/dL — ABNORMAL LOW (ref 13.0–17.0)
MCH: 29.3 pg (ref 26.0–34.0)
MCHC: 33.8 g/dL (ref 30.0–36.0)
MCV: 86.7 fL (ref 78.0–100.0)
Platelets: 361 10*3/uL (ref 150–400)
RBC: 2.49 MIL/uL — ABNORMAL LOW (ref 4.22–5.81)
RDW: 15.7 % — ABNORMAL HIGH (ref 11.5–15.5)
WBC: 28 10*3/uL — ABNORMAL HIGH (ref 4.0–10.5)

## 2016-01-27 LAB — RENAL FUNCTION PANEL
Albumin: 1.9 g/dL — ABNORMAL LOW (ref 3.5–5.0)
Albumin: 1.9 g/dL — ABNORMAL LOW (ref 3.5–5.0)
Anion gap: 10 (ref 5–15)
Anion gap: 9 (ref 5–15)
BUN: 64 mg/dL — ABNORMAL HIGH (ref 6–20)
BUN: 69 mg/dL — ABNORMAL HIGH (ref 6–20)
CO2: 21 mmol/L — ABNORMAL LOW (ref 22–32)
CO2: 22 mmol/L (ref 22–32)
Calcium: 8.4 mg/dL — ABNORMAL LOW (ref 8.9–10.3)
Calcium: 8.3 mg/dL — ABNORMAL LOW (ref 8.9–10.3)
Chloride: 92 mmol/L — ABNORMAL LOW (ref 101–111)
Chloride: 91 mmol/L — ABNORMAL LOW (ref 101–111)
Creatinine, Ser: 6.22 mg/dL — ABNORMAL HIGH (ref 0.61–1.24)
Creatinine, Ser: 6.41 mg/dL — ABNORMAL HIGH (ref 0.61–1.24)
GFR calc Af Amer: 14 mL/min — ABNORMAL LOW (ref >60)
GFR calc Af Amer: 13 mL/min — ABNORMAL LOW (ref >60)
GFR calc non Af Amer: 12 mL/min — ABNORMAL LOW (ref >60)
GFR calc non Af Amer: 11 mL/min — ABNORMAL LOW (ref >60)
Glucose, Bld: 107 mg/dL — ABNORMAL HIGH (ref 65–99)
Glucose, Bld: 101 mg/dL — ABNORMAL HIGH (ref 65–99)
Phosphorus: 4.8 mg/dL — ABNORMAL HIGH (ref 2.5–4.6)
Phosphorus: 4.9 mg/dL — ABNORMAL HIGH (ref 2.5–4.6)
Potassium: 3.4 mmol/L — ABNORMAL LOW (ref 3.5–5.1)
Potassium: 3.6 mmol/L (ref 3.5–5.1)
Sodium: 123 mmol/L — ABNORMAL LOW (ref 135–145)
Sodium: 122 mmol/L — ABNORMAL LOW (ref 135–145)

## 2016-01-27 LAB — APTT: aPTT: 39 seconds — ABNORMAL HIGH (ref 24–37)

## 2016-01-27 LAB — GLUCOSE, CAPILLARY
Glucose-Capillary: 115 mg/dL — ABNORMAL HIGH (ref 65–99)
Glucose-Capillary: 122 mg/dL — ABNORMAL HIGH (ref 65–99)
Glucose-Capillary: 108 mg/dL — ABNORMAL HIGH (ref 65–99)
Glucose-Capillary: 122 mg/dL — ABNORMAL HIGH (ref 65–99)
Glucose-Capillary: 124 mg/dL — ABNORMAL HIGH (ref 65–99)
Glucose-Capillary: 115 mg/dL — ABNORMAL HIGH (ref 65–99)

## 2016-01-27 LAB — MAGNESIUM: Magnesium: 2.1 mg/dL (ref 1.7–2.4)

## 2016-01-27 MED ORDER — HEPARIN SODIUM (PORCINE) 5000 UNIT/ML IJ SOLN
5000.0000 [IU] | Freq: Three times a day (TID) | INTRAMUSCULAR | Status: DC
  Administered 2016-01-28 (×2): 5000 [IU] via SUBCUTANEOUS
  Filled 2016-01-27 (×2): qty 1

## 2016-01-27 MED ORDER — HEPARIN SODIUM (PORCINE) 5000 UNIT/ML IJ SOLN: 5000.0000 [IU] | Freq: Three times a day (TID) | INTRAMUSCULAR | Status: DC

## 2016-01-27 MED ORDER — DIATRIZOATE MEGLUMINE & SODIUM 66-10 % PO SOLN
ORAL | Status: AC
  Filled 2016-01-27: qty 30

## 2016-01-27 NOTE — Progress Notes (Signed)
Visitors in the waiting room wanting to visit patient however didn't have password then another visitor gave the  password around in the waiting area. Patient created new password [email or web address].
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
## 2016-01-27 NOTE — Progress Notes (Signed)
Patient has had many visitors today showing up without the password. It was explained to family that we require the password to be given not the name of the patient for patient safety.

## 2016-01-27 NOTE — Progress Notes (Signed)
Peripherally Inserted Central Catheter/Midline Placement  The IV Nurse has discussed with the patient and/or persons authorized to consent for the patient, the purpose of this procedure and the potential benefits and risks involved with this procedure.  The benefits include less needle sticks, lab draws from the catheter and patient may be discharged home with the catheter.  Risks include, but not limited to, infection, bleeding, blood clot (thrombus formation), and puncture of an artery; nerve damage and irregular heat beat.  Alternatives to this procedure were also discussed. Verbal consent given for PICC exchange by pt.  Bard educational information left at bedside.  PICC/Midline Placement Documentation  PICC Single Lumen 01/27/16 PICC Left Basilic 41 cm 0 cm (Active)  Indication for Insertion or Continuance of Line Prolonged intravenous therapies 01/27/2016 10:50 AM  Exposed Catheter (cm) 0 cm 01/27/2016 10:50 AM  Site Assessment Clean;Dry;Intact 01/27/2016 10:50 AM  Line Status Saline locked;Flushed;Blood return noted 01/27/2016 10:50 AM  Dressing Type Transparent 01/27/2016 10:50 AM  Dressing Status Clean;Dry;Intact;Antimicrobial disc in place 01/27/2016 10:50 AM  Line Care Connections checked and tightened 01/27/2016 10:50 AM  Line Adjustment (NICU/IV Team Only) No 01/27/2016 10:50 AM  Dressing Intervention New dressing 01/27/2016 10:50 AM  Dressing Change Due 02/03/16 01/27/2016 10:50 AM       Elliot Dallyiggs, Norie Latendresse Wright 01/27/2016, 10:52 AM

## 2016-01-27 NOTE — Progress Notes (Signed)
Patient was incontinent of bowel and bladder and attempted to get himself up to the bathroom but was at the foot of his bed still. Patient was cleaned up and patient needed to be moved back up to the top of the bed. Bed alarm is on and the importance of using the call bell reviewed with patient and family. Pt and family has been asking for more fluids that patient is allowed to have and it is suspected that patient's family is giving him more.

## 2016-01-28 ENCOUNTER — Inpatient Hospital Stay (HOSPITAL_COMMUNITY): Payer: Medicaid Other

## 2016-01-28 LAB — RENAL FUNCTION PANEL
Albumin: 1.9 g/dL — ABNORMAL LOW (ref 3.5–5.0)
Albumin: 2 g/dL — ABNORMAL LOW (ref 3.5–5.0)
Anion gap: 10 (ref 5–15)
Anion gap: 12 (ref 5–15)
BUN: 80 mg/dL — ABNORMAL HIGH (ref 6–20)
BUN: 89 mg/dL — ABNORMAL HIGH (ref 6–20)
CO2: 21 mmol/L — ABNORMAL LOW (ref 22–32)
CO2: 20 mmol/L — ABNORMAL LOW (ref 22–32)
Calcium: 8.5 mg/dL — ABNORMAL LOW (ref 8.9–10.3)
Calcium: 8.9 mg/dL (ref 8.9–10.3)
Chloride: 90 mmol/L — ABNORMAL LOW (ref 101–111)
Chloride: 88 mmol/L — ABNORMAL LOW (ref 101–111)
Creatinine, Ser: 7.39 mg/dL — ABNORMAL HIGH (ref 0.61–1.24)
Creatinine, Ser: 7.99 mg/dL — ABNORMAL HIGH (ref 0.61–1.24)
GFR calc Af Amer: 11 mL/min — ABNORMAL LOW (ref >60)
GFR calc Af Amer: 10 mL/min — ABNORMAL LOW (ref >60)
GFR calc non Af Amer: 9 mL/min — ABNORMAL LOW (ref >60)
GFR calc non Af Amer: 9 mL/min — ABNORMAL LOW (ref >60)
Glucose, Bld: 117 mg/dL — ABNORMAL HIGH (ref 65–99)
Glucose, Bld: 121 mg/dL — ABNORMAL HIGH (ref 65–99)
Phosphorus: 5.1 mg/dL — ABNORMAL HIGH (ref 2.5–4.6)
Phosphorus: 5.9 mg/dL — ABNORMAL HIGH (ref 2.5–4.6)
Potassium: 4 mmol/L (ref 3.5–5.1)
Potassium: 4.4 mmol/L (ref 3.5–5.1)
Sodium: 121 mmol/L — ABNORMAL LOW (ref 135–145)
Sodium: 120 mmol/L — ABNORMAL LOW (ref 135–145)

## 2016-01-28 LAB — URINALYSIS, ROUTINE W REFLEX MICROSCOPIC
Bilirubin Urine: NEGATIVE
Glucose, UA: NEGATIVE mg/dL
Ketones, ur: NEGATIVE mg/dL
Nitrite: NEGATIVE
Protein, ur: 30 mg/dL — AB
Specific Gravity, Urine: 1.02 (ref 1.005–1.030)
pH: 5 (ref 5.0–8.0)

## 2016-01-28 LAB — MAGNESIUM: Magnesium: 1.8 mg/dL (ref 1.7–2.4)

## 2016-01-28 LAB — GLUCOSE, CAPILLARY: Glucose-Capillary: 115 mg/dL — ABNORMAL HIGH (ref 65–99)

## 2016-01-28 LAB — URINE MICROSCOPIC-ADD ON

## 2016-01-28 LAB — CBC
HCT: 22.3 % — ABNORMAL LOW (ref 39.0–52.0)
Hemoglobin: 7.6 g/dL — ABNORMAL LOW (ref 13.0–17.0)
MCH: 29 pg (ref 26.0–34.0)
MCHC: 34.1 g/dL (ref 30.0–36.0)
MCV: 85.1 fL (ref 78.0–100.0)
Platelets: 301 10*3/uL (ref 150–400)
RBC: 2.62 MIL/uL — ABNORMAL LOW (ref 4.22–5.81)
RDW: 15.9 % — ABNORMAL HIGH (ref 11.5–15.5)
WBC: 28.5 10*3/uL — ABNORMAL HIGH (ref 4.0–10.5)

## 2016-01-28 LAB — APTT: aPTT: 38 seconds — ABNORMAL HIGH (ref 24–37)

## 2016-01-28 MED ORDER — PIPERACILLIN-TAZOBACTAM 3.375 G IVPB: 3.3750 g | Freq: Four times a day (QID) | INTRAVENOUS | Status: DC

## 2016-01-28 MED ORDER — DOCUSATE SODIUM 100 MG PO CAPS
100.0000 mg | ORAL_CAPSULE | Freq: Two times a day (BID) | ORAL | Status: DC
  Administered 2016-01-28 – 2016-02-03 (×5): 100 mg via ORAL
  Filled 2016-01-28 (×15): qty 1

## 2016-01-28 MED ORDER — OXYCODONE HCL 5 MG PO TABS
5.0000 mg | ORAL_TABLET | ORAL | Status: DC | PRN
  Administered 2016-01-28: 10 mg via ORAL
  Administered 2016-01-29: 5 mg via ORAL
  Administered 2016-01-29 – 2016-02-07 (×13): 15 mg via ORAL
  Filled 2016-01-28: qty 2
  Filled 2016-01-28 (×15): qty 3

## 2016-01-28 MED ORDER — POLYETHYLENE GLYCOL 3350 17 G PO PACK
17.0000 g | PACK | Freq: Every day | ORAL | Status: DC
  Administered 2016-02-03: 17 g via ORAL
  Filled 2016-01-28 (×7): qty 1

## 2016-01-28 MED ORDER — FUROSEMIDE 10 MG/ML IJ SOLN
160.0000 mg | Freq: Two times a day (BID) | INTRAVENOUS | Status: DC
  Administered 2016-01-28 – 2016-01-31 (×7): 160 mg via INTRAVENOUS
  Filled 2016-01-28 (×9): qty 16

## 2016-01-28 MED ORDER — PIPERACILLIN-TAZOBACTAM IN DEX 2-0.25 GM/50ML IV SOLN
2.2500 g | Freq: Four times a day (QID) | INTRAVENOUS | Status: DC
  Administered 2016-01-28 – 2016-01-31 (×12): 2.25 g via INTRAVENOUS
  Filled 2016-01-28 (×15): qty 50

## 2016-01-28 MED ORDER — MORPHINE SULFATE (PF) 2 MG/ML IV SOLN
2.0000 mg | INTRAVENOUS | Status: DC | PRN
  Administered 2016-01-29 – 2016-02-07 (×17): 2 mg via INTRAVENOUS
  Filled 2016-01-28 (×17): qty 1

## 2016-01-28 MED ORDER — SULFAMETHOXAZOLE-TRIMETHOPRIM 800-160 MG PO TABS: 1.0000 | ORAL_TABLET | Freq: Two times a day (BID) | ORAL | Status: DC

## 2016-01-28 MED ORDER — DIAZEPAM 5 MG PO TABS
5.0000 mg | ORAL_TABLET | Freq: Three times a day (TID) | ORAL | Status: DC | PRN
Start: 2016-01-28 — End: 2016-02-07
  Administered 2016-02-04: 5 mg via ORAL
  Filled 2016-01-28 (×2): qty 1

## 2016-01-28 MED ORDER — HEPARIN SODIUM (PORCINE) 5000 UNIT/ML IJ SOLN
5000.0000 [IU] | Freq: Three times a day (TID) | INTRAMUSCULAR | Status: DC
  Administered 2016-01-30 (×2): 5000 [IU] via SUBCUTANEOUS
  Filled 2016-01-28 (×2): qty 1
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
## 2016-01-28 NOTE — Progress Notes (Signed)
Contrast for CT begun at this time.
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
## 2016-01-28 NOTE — Progress Notes (Signed)
Patient refused to drink contrast to do CT testing. I explained the importance of doing the test but patient still refusing.

## 2016-01-29 ENCOUNTER — Inpatient Hospital Stay (HOSPITAL_COMMUNITY): Payer: Medicaid Other

## 2016-01-29 LAB — RENAL FUNCTION PANEL
Albumin: 1.8 g/dL — ABNORMAL LOW (ref 3.5–5.0)
Albumin: 1.9 g/dL — ABNORMAL LOW (ref 3.5–5.0)
Anion gap: 16 — ABNORMAL HIGH (ref 5–15)
Anion gap: 15 (ref 5–15)
BUN: 102 mg/dL — ABNORMAL HIGH (ref 6–20)
BUN: 110 mg/dL — ABNORMAL HIGH (ref 6–20)
CO2: 19 mmol/L — ABNORMAL LOW (ref 22–32)
CO2: 18 mmol/L — ABNORMAL LOW (ref 22–32)
Calcium: 9 mg/dL (ref 8.9–10.3)
Calcium: 8.5 mg/dL — ABNORMAL LOW (ref 8.9–10.3)
Chloride: 86 mmol/L — ABNORMAL LOW (ref 101–111)
Chloride: 85 mmol/L — ABNORMAL LOW (ref 101–111)
Creatinine, Ser: 8.53 mg/dL — ABNORMAL HIGH (ref 0.61–1.24)
Creatinine, Ser: 8.41 mg/dL — ABNORMAL HIGH (ref 0.61–1.24)
GFR calc Af Amer: 9 mL/min — ABNORMAL LOW (ref >60)
GFR calc Af Amer: 9 mL/min — ABNORMAL LOW (ref >60)
GFR calc non Af Amer: 8 mL/min — ABNORMAL LOW (ref >60)
GFR calc non Af Amer: 8 mL/min — ABNORMAL LOW (ref >60)
Glucose, Bld: 114 mg/dL — ABNORMAL HIGH (ref 65–99)
Glucose, Bld: 118 mg/dL — ABNORMAL HIGH (ref 65–99)
Phosphorus: 7.7 mg/dL — ABNORMAL HIGH (ref 2.5–4.6)
Phosphorus: 8.4 mg/dL — ABNORMAL HIGH (ref 2.5–4.6)
Potassium: 4.4 mmol/L (ref 3.5–5.1)
Potassium: 4.4 mmol/L (ref 3.5–5.1)
Sodium: 121 mmol/L — ABNORMAL LOW (ref 135–145)
Sodium: 118 mmol/L — CL (ref 135–145)

## 2016-01-29 LAB — GLUCOSE, CAPILLARY
Glucose-Capillary: 126 mg/dL — ABNORMAL HIGH (ref 65–99)
Glucose-Capillary: 127 mg/dL — ABNORMAL HIGH (ref 65–99)

## 2016-01-29 LAB — MAGNESIUM: Magnesium: 1.9 mg/dL (ref 1.7–2.4)

## 2016-01-29 LAB — PROTIME-INR
INR: 1.68 — ABNORMAL HIGH (ref 0.00–1.49)
Prothrombin Time: 19.8 seconds — ABNORMAL HIGH (ref 11.6–15.2)

## 2016-01-29 LAB — APTT: aPTT: 41 seconds — ABNORMAL HIGH (ref 24–37)

## 2016-01-29 MED ORDER — MIDAZOLAM HCL 2 MG/2ML IJ SOLN
INTRAMUSCULAR | Status: AC
  Filled 2016-01-29: qty 4

## 2016-01-29 MED ORDER — SODIUM CHLORIDE 3 % IV SOLN
INTRAVENOUS | Status: DC
  Administered 2016-01-30 (×2): 50 mL/h via INTRAVENOUS
  Filled 2016-01-29 (×3): qty 500

## 2016-01-29 MED ORDER — MIDAZOLAM HCL 2 MG/2ML IJ SOLN
INTRAMUSCULAR | Status: AC | PRN
  Administered 2016-01-29: 0.5 mg via INTRAVENOUS
  Administered 2016-01-29: 1 mg via INTRAVENOUS
  Administered 2016-01-29: 0.5 mg via INTRAVENOUS

## 2016-01-29 MED ORDER — FENTANYL CITRATE (PF) 100 MCG/2ML IJ SOLN
INTRAMUSCULAR | Status: AC | PRN
  Administered 2016-01-29 (×2): 25 ug via INTRAVENOUS
  Administered 2016-01-29: 50 ug via INTRAVENOUS

## 2016-01-29 MED ORDER — SODIUM CHLORIDE 1 G PO TABS
1.0000 g | ORAL_TABLET | Freq: Three times a day (TID) | ORAL | Status: DC
  Filled 2016-01-29: qty 1

## 2016-01-29 MED ORDER — FENTANYL CITRATE (PF) 100 MCG/2ML IJ SOLN
INTRAMUSCULAR | Status: AC
  Filled 2016-01-29: qty 4

## 2016-01-29 MED ORDER — LIDOCAINE-EPINEPHRINE 1 %-1:100000 IJ SOLN
INTRAMUSCULAR | Status: AC
  Filled 2016-01-29: qty 1

## 2016-01-29 NOTE — Sedation Documentation (Signed)
Patient is resting comfortably. 

## 2016-01-29 NOTE — Progress Notes (Signed)
OT Cancellation Note  Patient Details Name: Samuel Owens MRN: [id number] DOB: 10/21/1995   Cancelled Treatment:    Reason Eval/Treat Not Completed: Patient at procedure or test/ unavailable. Pt currently in CT-imaging. Will try back for treatment at later time.  Evette GeorgesLeonard, Nolan Tuazon Eva [phone] 01/29/2016, 10:15 AM
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
## 2016-01-29 NOTE — Sedation Documentation (Signed)
Vital signs stable. No complaints from pt at this time.  
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
## 2016-01-29 NOTE — Progress Notes (Signed)
[facility] [place] [phone]

## 2016-01-29 NOTE — Sedation Documentation (Signed)
Vital signs stable. 

## 2016-01-29 NOTE — Sedation Documentation (Signed)
Pt tolerated procedure well. Attempted to call report, nurse unavailable at this time. RN will try back

## 2016-01-29 NOTE — Progress Notes (Signed)
CRITICAL VALUE ALERT  Critical value received:  Na + 118   Date of notification:  01/29/2016   Time of notification:  15:10  Critical value read back: yes  Nurse who received alert:  Freddy FinnerHannah M. Daniil Labarge   MD notified (1st page):  Charma IgoMichael Jeffery, PA  Time of first page:  15:11  MD notified (2nd page): n/a   Time of second page: n/a   Responding MD:  Same   Time MD responded:  15:13

## 2016-01-29 NOTE — Procedures (Signed)
Technically successful CT guided placed of a 10 Fr drainage catheter placement into the right kidney yielding 120 cc of blood colored fluid.  Technically successful CT guided placed of a 12 Fr drainage catheter placement into the peri hepatic space yielding 50 cc of blood colored fluid.    Samples from each collection sent to the laboratory for analysis.    EBL: None  No immediate post procedural complications.   Samuel RightJay Osiel Stick, MD Pager #: [phone]

## 2016-01-30 ENCOUNTER — Inpatient Hospital Stay (HOSPITAL_COMMUNITY): Payer: Medicaid Other

## 2016-01-30 DIAGNOSIS — D72829 Elevated white blood cell count, unspecified: Secondary | ICD-10-CM

## 2016-01-30 LAB — RENAL FUNCTION PANEL
Albumin: 1.9 g/dL — ABNORMAL LOW (ref 3.5–5.0)
Albumin: 1.9 g/dL — ABNORMAL LOW (ref 3.5–5.0)
Anion gap: 17 — ABNORMAL HIGH (ref 5–15)
Anion gap: 15 (ref 5–15)
BUN: 117 mg/dL — ABNORMAL HIGH (ref 6–20)
BUN: 122 mg/dL — ABNORMAL HIGH (ref 6–20)
CO2: 17 mmol/L — ABNORMAL LOW (ref 22–32)
CO2: 19 mmol/L — ABNORMAL LOW (ref 22–32)
Calcium: 8.6 mg/dL — ABNORMAL LOW (ref 8.9–10.3)
Calcium: 8.2 mg/dL — ABNORMAL LOW (ref 8.9–10.3)
Chloride: 87 mmol/L — ABNORMAL LOW (ref 101–111)
Chloride: 86 mmol/L — ABNORMAL LOW (ref 101–111)
Creatinine, Ser: 7.91 mg/dL — ABNORMAL HIGH (ref 0.61–1.24)
Creatinine, Ser: 6.81 mg/dL — ABNORMAL HIGH (ref 0.61–1.24)
GFR calc Af Amer: 10 mL/min — ABNORMAL LOW (ref >60)
GFR calc Af Amer: 12 mL/min — ABNORMAL LOW (ref >60)
GFR calc non Af Amer: 9 mL/min — ABNORMAL LOW (ref >60)
GFR calc non Af Amer: 10 mL/min — ABNORMAL LOW (ref >60)
Glucose, Bld: 100 mg/dL — ABNORMAL HIGH (ref 65–99)
Glucose, Bld: 112 mg/dL — ABNORMAL HIGH (ref 65–99)
Phosphorus: 9.3 mg/dL — ABNORMAL HIGH (ref 2.5–4.6)
Phosphorus: 8 mg/dL — ABNORMAL HIGH (ref 2.5–4.6)
Potassium: 3.9 mmol/L (ref 3.5–5.1)
Potassium: 3.7 mmol/L (ref 3.5–5.1)
Sodium: 121 mmol/L — ABNORMAL LOW (ref 135–145)
Sodium: 120 mmol/L — ABNORMAL LOW (ref 135–145)

## 2016-01-30 LAB — CBC WITH DIFFERENTIAL/PLATELET
Basophils Absolute: 0 10*3/uL (ref 0.0–0.1)
Basophils Relative: 0 %
Eosinophils Absolute: 0 10*3/uL (ref 0.0–0.7)
Eosinophils Relative: 0 %
HCT: 21.4 % — ABNORMAL LOW (ref 39.0–52.0)
Hemoglobin: 7.5 g/dL — ABNORMAL LOW (ref 13.0–17.0)
Lymphocytes Relative: 8 %
Lymphs Abs: 2.8 10*3/uL (ref 0.7–4.0)
MCH: 29 pg (ref 26.0–34.0)
MCHC: 35 g/dL (ref 30.0–36.0)
MCV: 82.6 fL (ref 78.0–100.0)
Monocytes Absolute: 1.1 10*3/uL — ABNORMAL HIGH (ref 0.1–1.0)
Monocytes Relative: 3 %
Neutro Abs: 31.5 10*3/uL — ABNORMAL HIGH (ref 1.7–7.7)
Neutrophils Relative %: 89 %
Platelets: 335 10*3/uL (ref 150–400)
RBC: 2.59 MIL/uL — ABNORMAL LOW (ref 4.22–5.81)
RDW: 16.2 % — ABNORMAL HIGH (ref 11.5–15.5)
WBC: 35.4 10*3/uL — ABNORMAL HIGH (ref 4.0–10.5)

## 2016-01-30 LAB — BASIC METABOLIC PANEL
Anion gap: 13 (ref 5–15)
BUN: 118 mg/dL — ABNORMAL HIGH (ref 6–20)
CO2: 19 mmol/L — ABNORMAL LOW (ref 22–32)
Calcium: 8.3 mg/dL — ABNORMAL LOW (ref 8.9–10.3)
Chloride: 91 mmol/L — ABNORMAL LOW (ref 101–111)
Creatinine, Ser: 7.21 mg/dL — ABNORMAL HIGH (ref 0.61–1.24)
GFR calc Af Amer: 11 mL/min — ABNORMAL LOW (ref >60)
GFR calc non Af Amer: 10 mL/min — ABNORMAL LOW (ref >60)
Glucose, Bld: 146 mg/dL — ABNORMAL HIGH (ref 65–99)
Potassium: 3.6 mmol/L (ref 3.5–5.1)
Sodium: 123 mmol/L — ABNORMAL LOW (ref 135–145)

## 2016-01-30 LAB — TRIGLYCERIDES: Triglycerides: 342 mg/dL — ABNORMAL HIGH (ref <150)

## 2016-01-30 LAB — MAGNESIUM: Magnesium: 1.9 mg/dL (ref 1.7–2.4)

## 2016-01-30 LAB — APTT: aPTT: 41 seconds — ABNORMAL HIGH (ref 24–37)

## 2016-01-30 LAB — CREATININE, FLUID (PLEURAL, PERITONEAL, JP DRAINAGE): Creat, Fluid: 12.8 mg/dL

## 2016-01-30 LAB — SODIUM: Sodium: 124 mmol/L — ABNORMAL LOW (ref 135–145)

## 2016-01-30 LAB — OSMOLALITY: Osmolality: 301 mOsm/kg — ABNORMAL HIGH (ref 275–295)

## 2016-01-30 MED ORDER — ENOXAPARIN SODIUM 80 MG/0.8ML ~~LOC~~ SOLN
1.0000 mg/kg | SUBCUTANEOUS | Status: DC
  Administered 2016-01-30 – 2016-02-02 (×4): 65 mg via SUBCUTANEOUS
  Filled 2016-01-30 (×5): qty 0.8
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
## 2016-01-30 NOTE — Progress Notes (Signed)
PT Cancellation Note  Patient Details Name: Samuel PaganShiquan Owens MRN: [id number] DOB: 04/10/1996   Cancelled Treatment:    Reason Eval/Treat Not Completed: Patient declined, no reason specifiedPt declined mobility despite max encouragement. Visitors present. Pt communicated very little and reported that speaking is painful. PT will continue to follow acutely.    Derek MoundKellyn R Stephano Arrants Otelia Hettinger, PTA Pager: [phone]   01/30/2016, 3:00 PM
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
## 2016-01-30 NOTE — Progress Notes (Signed)
OT Cancellation Note  Patient Details Name: Samuel Owens MRN: [id number] DOB: 01-14-1996   Cancelled Treatment:    Reason Eval/Treat Not Completed: Other (comment) -- Attempted OT session. Patient received in bed; visitors at bedside. Patient did not wish to participate in OT due to discomfort from drains placed yesterday. Will continue OT per plan of care.  Ramel Tobon A 01/30/2016, 12:52 PM

## 2016-01-30 NOTE — Progress Notes (Signed)
ANTICOAGULATION CONSULT NOTE - Initial Consult  Pharmacy Consult for Lovenox Indication: DVT  No Known Allergies  Patient Measurements: Height: 6' (182.9 cm) Weight: 141 lb 5 oz (64.1 kg) IBW/kg (Calculated) : 77.6 Heparin Dosing Weight:   Vital Signs: Temp: 98.6 F (37 C) (07/12 1523) Temp Source: Oral (07/12 1523) BP: 141/89 mmHg (07/12 1523) Pulse Rate: 96 (07/12 1523)  Labs:  Recent Labs  01/28/16 0520  01/29/16 0630 01/29/16 1440 01/30/16 0425 01/30/16 1135  HGB 7.6*  --   --   --  7.5*  --   HCT 22.3*  --   --   --  21.4*  --   PLT 301  --   --   --  335  --   APTT 38*  --  41*  --  41*  --   LABPROT  --   --  19.8*  --   --   --   INR  --   --  1.68*  --   --   --   CREATININE 7.39*  < > 8.53* 8.41* 7.91* 7.21*  < > = values in this interval not displayed.  Estimated Creatinine Clearance: 14.7 mL/min (by C-G formula based on Cr of 7.21).   Medical History: Past Medical History  Diagnosis Date  . Non-smoker     Assessment: 20yo male with doppler revealing acute mobile DVT in R internal jugular vein and L subclavian vein. Pharmacy is consulted to dose lovenox for DVT.   Goal of Therapy:  Monitor platelets by anticoagulation protocol: Yes   Plan:  Lovenox 1mg /kg subcutaneously q24h Monitor renal function and s/sx of bleeding F/u long-term anticoag plan  Arlean Hoppingorey M. Newman PiesBall, PharmD, BCPS Clinical Pharmacist Pager [phone] 01/30/2016,7:02 PM

## 2016-01-30 NOTE — Progress Notes (Signed)
*  Preliminary Results* Bilateral upper extremity venous duplex completed. Bilateral upper extremities are positive for acute mobile deep vein thrombosis involving the right internal jugular vein and left subclavian vein.   Bilateral lower extremity venous duplex completed. Bilateral lower extremities are negative for deep vein thrombosis. There is no evidence of Baker's cyst bilaterally.   Preliminary results discussed with Dr. Donell BeersByerly.  01/30/2016  Gertie FeyMichelle Teigan Manner, RVT, RDCS, RDMS
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
## 2016-01-31 ENCOUNTER — Other Ambulatory Visit: Payer: Self-pay | Admitting: Urology

## 2016-01-31 LAB — CBC
HCT: 22.1 % — ABNORMAL LOW (ref 39.0–52.0)
Hemoglobin: 7.5 g/dL — ABNORMAL LOW (ref 13.0–17.0)
MCH: 28.3 pg (ref 26.0–34.0)
MCHC: 33.9 g/dL (ref 30.0–36.0)
MCV: 83.4 fL (ref 78.0–100.0)
Platelets: 325 10*3/uL (ref 150–400)
RBC: 2.65 MIL/uL — ABNORMAL LOW (ref 4.22–5.81)
RDW: 15.8 % — ABNORMAL HIGH (ref 11.5–15.5)
WBC: 25.9 10*3/uL — ABNORMAL HIGH (ref 4.0–10.5)

## 2016-01-31 LAB — SODIUM
Sodium: 120 mmol/L — ABNORMAL LOW (ref 135–145)
Sodium: 123 mmol/L — ABNORMAL LOW (ref 135–145)
Sodium: 123 mmol/L — ABNORMAL LOW (ref 135–145)
Sodium: 122 mmol/L — ABNORMAL LOW (ref 135–145)

## 2016-01-31 LAB — RENAL FUNCTION PANEL
Albumin: 2 g/dL — ABNORMAL LOW (ref 3.5–5.0)
Albumin: 2 g/dL — ABNORMAL LOW (ref 3.5–5.0)
Anion gap: 16 — ABNORMAL HIGH (ref 5–15)
Anion gap: 16 — ABNORMAL HIGH (ref 5–15)
BUN: 120 mg/dL — ABNORMAL HIGH (ref 6–20)
BUN: 119 mg/dL — ABNORMAL HIGH (ref 6–20)
CO2: 19 mmol/L — ABNORMAL LOW (ref 22–32)
CO2: 21 mmol/L — ABNORMAL LOW (ref 22–32)
Calcium: 8.3 mg/dL — ABNORMAL LOW (ref 8.9–10.3)
Calcium: 8.4 mg/dL — ABNORMAL LOW (ref 8.9–10.3)
Chloride: 88 mmol/L — ABNORMAL LOW (ref 101–111)
Chloride: 86 mmol/L — ABNORMAL LOW (ref 101–111)
Creatinine, Ser: 6.22 mg/dL — ABNORMAL HIGH (ref 0.61–1.24)
Creatinine, Ser: 5.73 mg/dL — ABNORMAL HIGH (ref 0.61–1.24)
GFR calc Af Amer: 14 mL/min — ABNORMAL LOW (ref >60)
GFR calc Af Amer: 15 mL/min — ABNORMAL LOW (ref >60)
GFR calc non Af Amer: 12 mL/min — ABNORMAL LOW (ref >60)
GFR calc non Af Amer: 13 mL/min — ABNORMAL LOW (ref >60)
Glucose, Bld: 123 mg/dL — ABNORMAL HIGH (ref 65–99)
Glucose, Bld: 112 mg/dL — ABNORMAL HIGH (ref 65–99)
Phosphorus: 7.4 mg/dL — ABNORMAL HIGH (ref 2.5–4.6)
Phosphorus: 7.3 mg/dL — ABNORMAL HIGH (ref 2.5–4.6)
Potassium: 3.5 mmol/L (ref 3.5–5.1)
Potassium: 3.8 mmol/L (ref 3.5–5.1)
Sodium: 123 mmol/L — ABNORMAL LOW (ref 135–145)
Sodium: 123 mmol/L — ABNORMAL LOW (ref 135–145)

## 2016-01-31 LAB — URINE CULTURE: Culture: >=100000 — AB

## 2016-01-31 LAB — MAGNESIUM: Magnesium: 1.9 mg/dL (ref 1.7–2.4)

## 2016-01-31 LAB — APTT: aPTT: 38 seconds — ABNORMAL HIGH (ref 24–37)

## 2016-01-31 MED ORDER — CIPROFLOXACIN HCL 500 MG PO TABS
500.0000 mg | ORAL_TABLET | Freq: Two times a day (BID) | ORAL | Status: DC
  Administered 2016-01-31: 500 mg via ORAL
  Filled 2016-01-31: qty 1

## 2016-01-31 MED ORDER — FUROSEMIDE 10 MG/ML IJ SOLN
160.0000 mg | Freq: Every day | INTRAVENOUS | Status: DC
  Administered 2016-02-01 – 2016-02-05 (×5): 160 mg via INTRAVENOUS
  Filled 2016-01-31 (×6): qty 16

## 2016-01-31 MED ORDER — PRO-STAT SUGAR FREE PO LIQD
30.0000 mL | Freq: Two times a day (BID) | ORAL | Status: DC
  Administered 2016-01-31 – 2016-02-06 (×6): 30 mL via ORAL
  Filled 2016-01-31 (×14): qty 30

## 2016-01-31 MED ORDER — NEPRO/CARBSTEADY PO LIQD
237.0000 mL | ORAL | Status: DC
  Administered 2016-01-31 – 2016-02-04 (×3): 237 mL via ORAL
  Filled 2016-01-31 (×9): qty 237

## 2016-01-31 MED ORDER — CIPROFLOXACIN HCL 500 MG PO TABS
500.0000 mg | ORAL_TABLET | Freq: Every day | ORAL | Status: DC
  Administered 2016-02-01 – 2016-02-03 (×3): 500 mg via ORAL
  Filled 2016-01-31 (×4): qty 1
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
## 2016-01-31 NOTE — Progress Notes (Signed)
SLP Cancellation Note  Patient Details Name: Janit PaganShiquan XXXmoore MRN: [id number] DOB: 08-05-1995   Cancelled treatment:       Reason Eval/Treat Not Completed: Other (comment) Pt toileting upon SLP arrival, will return as able for dysphagia treatment.   Maxcine HamLaura Paiewonsky, M.A. CCC-SLP [phone]  Maxcine Hamaiewonsky, Meshia Rau 01/31/2016, 3:40 PM
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
## 2016-02-01 DIAGNOSIS — I82409 Acute embolism and thrombosis of unspecified deep veins of unspecified lower extremity: Secondary | ICD-10-CM

## 2016-02-01 DIAGNOSIS — K651 Peritoneal abscess: Secondary | ICD-10-CM

## 2016-02-01 DIAGNOSIS — T8143XA Infection following a procedure, organ and space surgical site, initial encounter: Secondary | ICD-10-CM | POA: Diagnosis not present

## 2016-02-01 DIAGNOSIS — N179 Acute kidney failure, unspecified: Secondary | ICD-10-CM | POA: Diagnosis not present

## 2016-02-01 DIAGNOSIS — N390 Urinary tract infection, site not specified: Secondary | ICD-10-CM | POA: Diagnosis not present

## 2016-02-01 DIAGNOSIS — T8149XA Infection following a procedure, other surgical site, initial encounter: Secondary | ICD-10-CM

## 2016-02-01 HISTORY — DX: Acute embolism and thrombosis of unspecified deep veins of unspecified lower extremity: I82.409

## 2016-02-01 HISTORY — DX: Peritoneal abscess: K65.1

## 2016-02-01 HISTORY — DX: Infection following a procedure, organ and space surgical site, initial encounter: T81.43XA

## 2016-02-01 HISTORY — DX: Acute kidney failure, unspecified: N17.9

## 2016-02-01 LAB — BASIC METABOLIC PANEL
Anion gap: 16 — ABNORMAL HIGH (ref 5–15)
BUN: 115 mg/dL — ABNORMAL HIGH (ref 6–20)
CO2: 20 mmol/L — ABNORMAL LOW (ref 22–32)
Calcium: 8.7 mg/dL — ABNORMAL LOW (ref 8.9–10.3)
Chloride: 85 mmol/L — ABNORMAL LOW (ref 101–111)
Creatinine, Ser: 5.05 mg/dL — ABNORMAL HIGH (ref 0.61–1.24)
GFR calc Af Amer: 17 mL/min — ABNORMAL LOW (ref >60)
GFR calc non Af Amer: 15 mL/min — ABNORMAL LOW (ref >60)
Glucose, Bld: 127 mg/dL — ABNORMAL HIGH (ref 65–99)
Potassium: 3.4 mmol/L — ABNORMAL LOW (ref 3.5–5.1)
Sodium: 121 mmol/L — ABNORMAL LOW (ref 135–145)

## 2016-02-01 LAB — RENAL FUNCTION PANEL
Albumin: 2 g/dL — ABNORMAL LOW (ref 3.5–5.0)
Albumin: 1.9 g/dL — ABNORMAL LOW (ref 3.5–5.0)
Anion gap: 17 — ABNORMAL HIGH (ref 5–15)
Anion gap: 14 (ref 5–15)
BUN: 115 mg/dL — ABNORMAL HIGH (ref 6–20)
BUN: 111 mg/dL — ABNORMAL HIGH (ref 6–20)
CO2: 20 mmol/L — ABNORMAL LOW (ref 22–32)
CO2: 19 mmol/L — ABNORMAL LOW (ref 22–32)
Calcium: 8.7 mg/dL — ABNORMAL LOW (ref 8.9–10.3)
Calcium: 8.5 mg/dL — ABNORMAL LOW (ref 8.9–10.3)
Chloride: 85 mmol/L — ABNORMAL LOW (ref 101–111)
Chloride: 88 mmol/L — ABNORMAL LOW (ref 101–111)
Creatinine, Ser: 5.04 mg/dL — ABNORMAL HIGH (ref 0.61–1.24)
Creatinine, Ser: 4.57 mg/dL — ABNORMAL HIGH (ref 0.61–1.24)
GFR calc Af Amer: 17 mL/min — ABNORMAL LOW (ref >60)
GFR calc Af Amer: 20 mL/min — ABNORMAL LOW (ref >60)
GFR calc non Af Amer: 15 mL/min — ABNORMAL LOW (ref >60)
GFR calc non Af Amer: 17 mL/min — ABNORMAL LOW (ref >60)
Glucose, Bld: 126 mg/dL — ABNORMAL HIGH (ref 65–99)
Glucose, Bld: 131 mg/dL — ABNORMAL HIGH (ref 65–99)
Phosphorus: 6.4 mg/dL — ABNORMAL HIGH (ref 2.5–4.6)
Phosphorus: 6.2 mg/dL — ABNORMAL HIGH (ref 2.5–4.6)
Potassium: 3.5 mmol/L (ref 3.5–5.1)
Potassium: 3.5 mmol/L (ref 3.5–5.1)
Sodium: 122 mmol/L — ABNORMAL LOW (ref 135–145)
Sodium: 121 mmol/L — ABNORMAL LOW (ref 135–145)

## 2016-02-01 LAB — MAGNESIUM: Magnesium: 1.1 mg/dL — ABNORMAL LOW (ref 1.7–2.4)

## 2016-02-01 LAB — APTT: aPTT: 47 seconds — ABNORMAL HIGH (ref 24–37)

## 2016-02-01 MED ORDER — MAGNESIUM OXIDE 400 (241.3 MG) MG PO TABS
400.0000 mg | ORAL_TABLET | Freq: Two times a day (BID) | ORAL | Status: AC
  Administered 2016-02-01 – 2016-02-03 (×4): 400 mg via ORAL
  Filled 2016-02-01 (×5): qty 1

## 2016-02-01 MED ORDER — BACITRACIN ZINC 500 UNIT/GM EX OINT
TOPICAL_OINTMENT | Freq: Two times a day (BID) | CUTANEOUS | Status: DC
  Administered 2016-02-01 – 2016-02-06 (×8): via TOPICAL
  Administered 2016-02-08 – 2016-02-11 (×8): 31.5556 via TOPICAL
  Administered 2016-02-12: 10:00:00 via TOPICAL
  Filled 2016-02-01 (×4): qty 28.35
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
## 2016-02-02 LAB — RENAL FUNCTION PANEL
Albumin: 2 g/dL — ABNORMAL LOW (ref 3.5–5.0)
Albumin: 2.3 g/dL — ABNORMAL LOW (ref 3.5–5.0)
Anion gap: 16 — ABNORMAL HIGH (ref 5–15)
Anion gap: 13 (ref 5–15)
BUN: 105 mg/dL — ABNORMAL HIGH (ref 6–20)
BUN: 99 mg/dL — ABNORMAL HIGH (ref 6–20)
CO2: 21 mmol/L — ABNORMAL LOW (ref 22–32)
CO2: 20 mmol/L — ABNORMAL LOW (ref 22–32)
Calcium: 8.9 mg/dL (ref 8.9–10.3)
Calcium: 8.8 mg/dL — ABNORMAL LOW (ref 8.9–10.3)
Chloride: 88 mmol/L — ABNORMAL LOW (ref 101–111)
Chloride: 89 mmol/L — ABNORMAL LOW (ref 101–111)
Creatinine, Ser: 4.02 mg/dL — ABNORMAL HIGH (ref 0.61–1.24)
Creatinine, Ser: 3.39 mg/dL — ABNORMAL HIGH (ref 0.61–1.24)
GFR calc Af Amer: 23 mL/min — ABNORMAL LOW (ref >60)
GFR calc Af Amer: 28 mL/min — ABNORMAL LOW (ref >60)
GFR calc non Af Amer: 20 mL/min — ABNORMAL LOW (ref >60)
GFR calc non Af Amer: 24 mL/min — ABNORMAL LOW (ref >60)
Glucose, Bld: 143 mg/dL — ABNORMAL HIGH (ref 65–99)
Glucose, Bld: 107 mg/dL — ABNORMAL HIGH (ref 65–99)
Phosphorus: 5.4 mg/dL — ABNORMAL HIGH (ref 2.5–4.6)
Phosphorus: 5.4 mg/dL — ABNORMAL HIGH (ref 2.5–4.6)
Potassium: 3 mmol/L — ABNORMAL LOW (ref 3.5–5.1)
Potassium: 3.4 mmol/L — ABNORMAL LOW (ref 3.5–5.1)
Sodium: 125 mmol/L — ABNORMAL LOW (ref 135–145)
Sodium: 122 mmol/L — ABNORMAL LOW (ref 135–145)

## 2016-02-02 LAB — CBC
HCT: 23.3 % — ABNORMAL LOW (ref 39.0–52.0)
Hemoglobin: 7.7 g/dL — ABNORMAL LOW (ref 13.0–17.0)
MCH: 27.9 pg (ref 26.0–34.0)
MCHC: 33 g/dL (ref 30.0–36.0)
MCV: 84.4 fL (ref 78.0–100.0)
Platelets: 408 10*3/uL — ABNORMAL HIGH (ref 150–400)
RBC: 2.76 MIL/uL — ABNORMAL LOW (ref 4.22–5.81)
RDW: 15.4 % (ref 11.5–15.5)
WBC: 28.5 10*3/uL — ABNORMAL HIGH (ref 4.0–10.5)

## 2016-02-02 LAB — APTT: aPTT: 45 seconds — ABNORMAL HIGH (ref 24–37)

## 2016-02-02 LAB — MAGNESIUM: Magnesium: 2 mg/dL (ref 1.7–2.4)

## 2016-02-02 NOTE — Progress Notes (Signed)
ANTICOAGULATION CONSULT NOTE - Follow Up Consult  Pharmacy Consult:  Lovenox Indication:  New DVT  No Known Allergies  Patient Measurements: Height: 6' (182.9 cm) Weight: 135 lb 5.8 oz (61.4 kg) IBW/kg (Calculated) : 77.6  Vital Signs: Temp: 98.7 F (37.1 C) (07/15 0532) Temp Source: Oral (07/15 0532) BP: 156/98 mmHg (07/15 0532) Pulse Rate: 98 (07/15 0532)  Labs:  Recent Labs  01/31/16 0954  02/01/16 0527 02/01/16 1559 02/02/16 0403  HGB 7.5*  --   --   --  7.7*  HCT 22.1*  --   --   --  23.3*  PLT 325  --   --   --  408*  APTT 38*  --  47*  --  45*  CREATININE 6.22*  < > 5.05*  5.04* 4.57* 4.02*  < > = values in this interval not displayed.  Estimated Creatinine Clearance: 25 mL/min (by C-G formula based on Cr of 4.02).     Assessment: 20 YOM with acute mobile DVT in the right IJ and left subclavian vein to continue on Lovenox.  Patient's renal function is improving.   Goal of Therapy:  Anti-Xa level 0.6-1 units/ml 4hrs after LMWH dose given Monitor platelets by anticoagulation protocol: Yes    Plan:  - Continue Lovenox 65mg  SQ Q24H for CrCL < 30 ml/min - CBC Q72H while on Lovenox, PRN BMET   Madisin Hasan D. Laney Potashang, PharmD, BCPS Pager:  [phone] 02/02/2016, 9:42 AM
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
## 2016-02-03 LAB — AEROBIC/ANAEROBIC CULTURE (SURGICAL/DEEP WOUND)

## 2016-02-03 LAB — RENAL FUNCTION PANEL
Albumin: 2 g/dL — ABNORMAL LOW (ref 3.5–5.0)
Albumin: 2.3 g/dL — ABNORMAL LOW (ref 3.5–5.0)
Anion gap: 10 (ref 5–15)
Anion gap: 14 (ref 5–15)
BUN: 86 mg/dL — ABNORMAL HIGH (ref 6–20)
BUN: 86 mg/dL — ABNORMAL HIGH (ref 6–20)
CO2: 22 mmol/L (ref 22–32)
CO2: 21 mmol/L — ABNORMAL LOW (ref 22–32)
Calcium: 8.9 mg/dL (ref 8.9–10.3)
Calcium: 8.9 mg/dL (ref 8.9–10.3)
Chloride: 92 mmol/L — ABNORMAL LOW (ref 101–111)
Chloride: 90 mmol/L — ABNORMAL LOW (ref 101–111)
Creatinine, Ser: 2.99 mg/dL — ABNORMAL HIGH (ref 0.61–1.24)
Creatinine, Ser: 2.79 mg/dL — ABNORMAL HIGH (ref 0.61–1.24)
GFR calc Af Amer: 33 mL/min — ABNORMAL LOW (ref >60)
GFR calc Af Amer: 35 mL/min — ABNORMAL LOW (ref >60)
GFR calc non Af Amer: 28 mL/min — ABNORMAL LOW (ref >60)
GFR calc non Af Amer: 31 mL/min — ABNORMAL LOW (ref >60)
Glucose, Bld: 117 mg/dL — ABNORMAL HIGH (ref 65–99)
Glucose, Bld: 128 mg/dL — ABNORMAL HIGH (ref 65–99)
Phosphorus: 4.9 mg/dL — ABNORMAL HIGH (ref 2.5–4.6)
Phosphorus: 5 mg/dL — ABNORMAL HIGH (ref 2.5–4.6)
Potassium: 3.6 mmol/L (ref 3.5–5.1)
Potassium: 3.2 mmol/L — ABNORMAL LOW (ref 3.5–5.1)
Sodium: 124 mmol/L — ABNORMAL LOW (ref 135–145)
Sodium: 125 mmol/L — ABNORMAL LOW (ref 135–145)

## 2016-02-03 LAB — AEROBIC/ANAEROBIC CULTURE W GRAM STAIN (SURGICAL/DEEP WOUND)

## 2016-02-03 MED ORDER — ENOXAPARIN SODIUM 60 MG/0.6ML ~~LOC~~ SOLN
60.0000 mg | Freq: Once | SUBCUTANEOUS | Status: AC
  Administered 2016-02-03: 60 mg via SUBCUTANEOUS

## 2016-02-03 MED ORDER — ENOXAPARIN SODIUM 60 MG/0.6ML ~~LOC~~ SOLN
60.0000 mg | Freq: Two times a day (BID) | SUBCUTANEOUS | Status: DC
  Administered 2016-02-04 – 2016-02-06 (×3): 60 mg via SUBCUTANEOUS
  Filled 2016-02-03 (×5): qty 0.6

## 2016-02-03 NOTE — Progress Notes (Addendum)
ANTICOAGULATION CONSULT NOTE - Follow Up Consult  Pharmacy Consult:  Lovenox Indication:  New DVT  No Known Allergies  Patient Measurements: Height: 6' (182.9 cm) Weight: 135 lb 12.9 oz (61.6 kg) IBW/kg (Calculated) : 77.6  Vital Signs: Temp: 98.7 F (37.1 C) (07/16 0525) Temp Source: Oral (07/16 0525) BP: 154/105 mmHg (07/16 0525) Pulse Rate: 105 (07/16 0525)  Labs:  Recent Labs  02/01/16 0527  02/02/16 0403 02/02/16 1538 02/03/16 1119  HGB  --   --  7.7*  --   --   HCT  --   --  23.3*  --   --   PLT  --   --  408*  --   --   APTT 47*  --  45*  --   --   CREATININE 5.05*  5.04*  < > 4.02* 3.39* 2.99*  < > = values in this interval not displayed.  Estimated Creatinine Clearance: 33.8 mL/min (by C-G formula based on Cr of 2.99).     Assessment: 20 YOM with acute mobile DVT in the right IJ and left subclavian vein to continue on Lovenox.  Patient's renal function is improving further and now appropriate for BID dosing of Lovenox.  No bleeding reported.   Goal of Therapy:  Anti-Xa level 0.6-1 units/ml 4hrs after LMWH dose given Monitor platelets by anticoagulation protocol: Yes    Plan:  - Change Lovenox 65mg  SQ Q12H - CBC Q72H while on Lovenox - Consider increasing Cipro to 500mg  PO BID   Donielle Radziewicz D. Laney Potashang, PharmD, BCPS Pager:  [phone] 02/03/2016, 12:56 PM
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
## 2016-02-03 NOTE — Progress Notes (Signed)
Pt. refusing to have blood work drawn. When asked why he just says no even after importance of explained

## 2016-02-04 ENCOUNTER — Encounter (HOSPITAL_COMMUNITY): Admission: EM | Disposition: A | Discharge status: Home Health | Payer: Self-pay | Source: Home / Self Care

## 2016-02-04 ENCOUNTER — Ambulatory Visit (HOSPITAL_COMMUNITY): Payer: Medicaid Other | Admitting: Certified Registered Nurse Anesthetist

## 2016-02-04 ENCOUNTER — Encounter (HOSPITAL_COMMUNITY): Payer: Self-pay | Admitting: Urology

## 2016-02-04 ENCOUNTER — Ambulatory Visit (HOSPITAL_COMMUNITY): Payer: Medicaid Other

## 2016-02-04 HISTORY — PX: CYSTOSCOPY W/ URETERAL STENT PLACEMENT: SHX1429

## 2016-02-04 LAB — RENAL FUNCTION PANEL
Albumin: 2.2 g/dL — ABNORMAL LOW (ref 3.5–5.0)
Albumin: 2.1 g/dL — ABNORMAL LOW (ref 3.5–5.0)
Anion gap: 12 (ref 5–15)
Anion gap: 9 (ref 5–15)
BUN: 80 mg/dL — ABNORMAL HIGH (ref 6–20)
BUN: 69 mg/dL — ABNORMAL HIGH (ref 6–20)
CO2: 22 mmol/L (ref 22–32)
CO2: 26 mmol/L (ref 22–32)
Calcium: 8.9 mg/dL (ref 8.9–10.3)
Calcium: 8.8 mg/dL — ABNORMAL LOW (ref 8.9–10.3)
Chloride: 89 mmol/L — ABNORMAL LOW (ref 101–111)
Chloride: 92 mmol/L — ABNORMAL LOW (ref 101–111)
Creatinine, Ser: 2.77 mg/dL — ABNORMAL HIGH (ref 0.61–1.24)
Creatinine, Ser: 2.66 mg/dL — ABNORMAL HIGH (ref 0.61–1.24)
GFR calc Af Amer: 36 mL/min — ABNORMAL LOW (ref >60)
GFR calc Af Amer: 38 mL/min — ABNORMAL LOW (ref >60)
GFR calc non Af Amer: 31 mL/min — ABNORMAL LOW (ref >60)
GFR calc non Af Amer: 32 mL/min — ABNORMAL LOW (ref >60)
Glucose, Bld: 118 mg/dL — ABNORMAL HIGH (ref 65–99)
Glucose, Bld: 107 mg/dL — ABNORMAL HIGH (ref 65–99)
Phosphorus: 5.4 mg/dL — ABNORMAL HIGH (ref 2.5–4.6)
Phosphorus: 5.4 mg/dL — ABNORMAL HIGH (ref 2.5–4.6)
Potassium: 3.4 mmol/L — ABNORMAL LOW (ref 3.5–5.1)
Potassium: 3.4 mmol/L — ABNORMAL LOW (ref 3.5–5.1)
Sodium: 123 mmol/L — ABNORMAL LOW (ref 135–145)
Sodium: 127 mmol/L — ABNORMAL LOW (ref 135–145)

## 2016-02-04 LAB — MAGNESIUM: Magnesium: 1.9 mg/dL (ref 1.7–2.4)

## 2016-02-04 LAB — APTT: aPTT: 44 seconds — ABNORMAL HIGH (ref 24–37)

## 2016-02-04 SURGERY — CYSTOSCOPY, WITH RETROGRADE PYELOGRAM AND URETERAL STENT INSERTION
Anesthesia: General | Site: Ureter | Laterality: Right

## 2016-02-04 MED ORDER — MEPERIDINE HCL 50 MG/ML IJ SOLN: 6.2500 mg | INTRAMUSCULAR | Status: DC | PRN

## 2016-02-04 MED ORDER — IOHEXOL 300 MG/ML  SOLN
INTRAMUSCULAR | Status: DC | PRN
  Administered 2016-02-04: 10 mL via INTRAVENOUS

## 2016-02-04 MED ORDER — LACTATED RINGERS IV SOLN
INTRAVENOUS | Status: DC
  Administered 2016-02-04: 10:00:00 via INTRAVENOUS

## 2016-02-04 MED ORDER — DEXAMETHASONE SODIUM PHOSPHATE 10 MG/ML IJ SOLN
INTRAMUSCULAR | Status: AC
  Filled 2016-02-04: qty 1

## 2016-02-04 MED ORDER — MIDAZOLAM HCL 2 MG/2ML IJ SOLN
INTRAMUSCULAR | Status: AC
  Filled 2016-02-04: qty 2

## 2016-02-04 MED ORDER — PROPOFOL 10 MG/ML IV BOLUS
INTRAVENOUS | Status: AC
  Filled 2016-02-04: qty 20

## 2016-02-04 MED ORDER — HYDROMORPHONE HCL 1 MG/ML IJ SOLN
0.2500 mg | INTRAMUSCULAR | Status: DC | PRN
  Administered 2016-02-04: 0.5 mg via INTRAVENOUS

## 2016-02-04 MED ORDER — FENTANYL CITRATE (PF) 250 MCG/5ML IJ SOLN
INTRAMUSCULAR | Status: AC
  Filled 2016-02-04: qty 5

## 2016-02-04 MED ORDER — PIPERACILLIN-TAZOBACTAM 3.375 G IVPB 30 MIN
3.3750 g | INTRAVENOUS | Status: AC
  Administered 2016-02-04: 3.375 g via INTRAVENOUS
  Filled 2016-02-04: qty 50

## 2016-02-04 MED ORDER — SUCCINYLCHOLINE CHLORIDE 20 MG/ML IJ SOLN
INTRAMUSCULAR | Status: DC | PRN
  Administered 2016-02-04: 100 mg via INTRAVENOUS

## 2016-02-04 MED ORDER — ONDANSETRON HCL 4 MG/2ML IJ SOLN
INTRAMUSCULAR | Status: DC | PRN
Start: 2016-02-04 — End: 2016-02-04
  Administered 2016-02-04: 4 mg via INTRAVENOUS

## 2016-02-04 MED ORDER — MIDAZOLAM HCL 5 MG/5ML IJ SOLN
INTRAMUSCULAR | Status: DC | PRN
  Administered 2016-02-04: 2 mg via INTRAVENOUS

## 2016-02-04 MED ORDER — PIPERACILLIN-TAZOBACTAM 3.375 G IVPB
INTRAVENOUS | Status: AC
  Filled 2016-02-04: qty 50

## 2016-02-04 MED ORDER — FENTANYL CITRATE (PF) 100 MCG/2ML IJ SOLN
INTRAMUSCULAR | Status: AC
  Filled 2016-02-04: qty 2

## 2016-02-04 MED ORDER — LIDOCAINE HCL (CARDIAC) 20 MG/ML IV SOLN
INTRAVENOUS | Status: AC
  Filled 2016-02-04: qty 5

## 2016-02-04 MED ORDER — PROPOFOL 10 MG/ML IV BOLUS
INTRAVENOUS | Status: DC | PRN
  Administered 2016-02-04: 150 mg via INTRAVENOUS

## 2016-02-04 MED ORDER — LIDOCAINE HCL (CARDIAC) 20 MG/ML IV SOLN
INTRAVENOUS | Status: DC | PRN
  Administered 2016-02-04: 100 mg via INTRAVENOUS

## 2016-02-04 MED ORDER — HYDROMORPHONE HCL 1 MG/ML IJ SOLN
INTRAMUSCULAR | Status: DC
Start: 2016-02-04 — End: 2016-02-04
  Filled 2016-02-04: qty 1

## 2016-02-04 MED ORDER — CIPROFLOXACIN HCL 500 MG PO TABS
500.0000 mg | ORAL_TABLET | Freq: Two times a day (BID) | ORAL | Status: DC
  Administered 2016-02-04 – 2016-02-06 (×6): 500 mg via ORAL
  Filled 2016-02-04 (×7): qty 1

## 2016-02-04 MED ORDER — ONDANSETRON HCL 4 MG/2ML IJ SOLN: 4.0000 mg | Freq: Once | INTRAMUSCULAR | Status: DC | PRN

## 2016-02-04 MED ORDER — ONDANSETRON HCL 4 MG/2ML IJ SOLN
INTRAMUSCULAR | Status: AC
  Filled 2016-02-04: qty 2

## 2016-02-04 MED ORDER — FENTANYL CITRATE (PF) 100 MCG/2ML IJ SOLN
INTRAMUSCULAR | Status: DC | PRN
  Administered 2016-02-04: 25 ug via INTRAVENOUS
  Administered 2016-02-04: 100 ug via INTRAVENOUS
  Administered 2016-02-04: 25 ug via INTRAVENOUS
  Administered 2016-02-04: 50 ug via INTRAVENOUS

## 2016-02-04 SURGICAL SUPPLY — 23 items
BAG URO CATCHER STRL LF (MISCELLANEOUS) ×3 IMPLANT
BASKET DAKOTA 1.9FR 11X120 (BASKET) ×3 IMPLANT
BASKET LASER NITINOL 1.9FR (BASKET) IMPLANT
BSKT STON RTRVL 120 1.9FR (BASKET)
CATH FOLEY LATEX FREE 20FR (CATHETERS)
CATH FOLEY LF 20FR (CATHETERS) IMPLANT
CATH INTERMIT  6FR 70CM (CATHETERS) ×3 IMPLANT
CLOTH BEACON ORANGE TIMEOUT ST (SAFETY) ×3 IMPLANT
FIBER LASER TRAC TIP (UROLOGICAL SUPPLIES) IMPLANT
GLOVE BIO SURGEON STRL SZ8 (GLOVE) ×3 IMPLANT
GOWN STRL REUS W/TWL XL LVL3 (GOWN DISPOSABLE) ×3 IMPLANT
GUIDEWIRE ANG ZIPWIRE 038X150 (WIRE) ×3 IMPLANT
GUIDEWIRE STR DUAL SENSOR (WIRE) ×3 IMPLANT
IV NS 1000ML (IV SOLUTION) ×3
IV NS 1000ML BAXH (IV SOLUTION) ×1 IMPLANT
MANIFOLD NEPTUNE II (INSTRUMENTS) ×3 IMPLANT
PACK CYSTO (CUSTOM PROCEDURE TRAY) ×3 IMPLANT
SHEATH ACCESS URETERAL 54CM (SHEATH) ×3 IMPLANT
STENT CONTOUR 6FRX26X.038 (STENTS) ×2 IMPLANT
SYR CONTROL 10ML LL (SYRINGE) ×3 IMPLANT
TUBE FEEDING 8FR 16IN STR KANG (MISCELLANEOUS) ×3 IMPLANT
TUBING CONNECTING 10 (TUBING) ×2 IMPLANT
TUBING CONNECTING 10' (TUBING) ×1
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
## 2016-02-04 NOTE — Progress Notes (Signed)
Physical medicine rehabilitation consult requested chart reviewed. Patient with cystoscopy performed today and plan to complete formal rehabilitation consult in a.m.

## 2016-02-04 NOTE — Anesthesia Preprocedure Evaluation (Addendum)
Anesthesia Evaluation  Patient identified by MRN, date of birth, ID band Patient awake    Reviewed: Allergy & Precautions, NPO status , Patient's Chart, lab work & pertinent test results  Airway Mallampati: I  TM Distance: >3 FB Neck ROM: Full    Dental   Pulmonary Current Smoker,    Pulmonary exam normal        Cardiovascular Normal cardiovascular exam     Neuro/Psych    GI/Hepatic   Endo/Other    Renal/GU      Musculoskeletal   Abdominal   Peds  Hematology   Anesthesia Other Findings   Reproductive/Obstetrics                             Anesthesia Physical Anesthesia Plan  ASA: II  Anesthesia Plan: General   Post-op Pain Management:    Induction: Intravenous  Airway Management Planned: Oral ETT  Additional Equipment:   Intra-op Plan:   Post-operative Plan: Extubation in OR  Informed Consent: I have reviewed the patients History and Physical, chart, labs and discussed the procedure including the risks, benefits and alternatives for the proposed anesthesia with the patient or authorized representative who has indicated his/her understanding and acceptance.     Plan Discussed with: CRNA and Surgeon  Anesthesia Plan Comments:         Anesthesia Quick Evaluation  

## 2016-02-04 NOTE — Progress Notes (Signed)
Patient ID: Samuel Owens, male   DOB: [birth year], 20 y.o.   MRN: [id number] S: s/p cysto for stent placement O:BP 155/98 mmHg  Pulse 106  Temp(Src) 98.2 F (36.8 C) (Oral)  Resp 16  Ht 6' (1.829 m)  Wt 61.2 kg (134 lb 14.7 oz)  BMI 18.29 kg/m2  SpO2 99%  Intake/Output Summary (Last 24 hours) at 02/04/16 0908 Last data filed at 02/04/16 0738  Gross per 24 hour  Intake    377 ml  Output   1975 ml  Net  -1598 ml   Intake/Output: I/O last 3 completed shifts: In: 797 [P.O.:747; IV Piggyback:50] Out: 2595 [Urine:2270; Drains:325]  Intake/Output this shift:  Total I/O In: -  Out: 400 [Urine:200; Drains:200] Weight change: -0.4 kg (-14.1 oz)    Recent Labs Lab 02/01/16 0527 02/01/16 1559 02/02/16 0403 02/02/16 1538 02/03/16 1119 02/03/16 1624 02/04/16 0415  NA 121*  122* 121* 125* 122* 124* 125* 123*  K 3.4*  3.5 3.5 3.0* 3.4* 3.6 3.2* 3.4*  CL 85*  85* 88* 88* 89* 92* 90* 89*  CO2 20*  20* 19* 21* 20* 22 21* 22  GLUCOSE 127*  126* 131* 143* 107* 117* 128* 118*  BUN 115*  115* 111* 105* 99* 86* 86* 80*  CREATININE 5.05*  5.04* 4.57* 4.02* 3.39* 2.99* 2.79* 2.77*  ALBUMIN 2.0* 1.9* 2.0* 2.3* 2.0* 2.3* 2.2*  CALCIUM 8.7*  8.7* 8.5* 8.9 8.8* 8.9 8.9 8.9  PHOS 6.4* 6.2* 5.4* 5.4* 4.9* 5.0* 5.4*   Liver Function Tests:  Recent Labs Lab 02/03/16 1119 02/03/16 1624 02/04/16 0415  ALBUMIN 2.0* 2.3* 2.2*   No results for input(s): LIPASE, AMYLASE in the last 168 hours. No results for input(s): AMMONIA in the last 168 hours. CBC:  Recent Labs Lab 01/30/16 0425 01/31/16 0954 02/02/16 0403  WBC 35.4* 25.9* 28.5*  NEUTROABS 31.5*  --   --   HGB 7.5* 7.5* 7.7*  HCT 21.4* 22.1* 23.3*  MCV 82.6 83.4 84.4  PLT 335 325 408*   Cardiac Enzymes: No results for input(s): CKTOTAL, CKMB, CKMBINDEX, TROPONINI in the last 168 hours. CBG:  Recent Labs Lab 01/28/16 2335 01/29/16 0403  GLUCAP 127* 126*    Iron Studies: No results for input(s): IRON, TIBC,  TRANSFERRIN, FERRITIN in the last 72 hours. Studies/Results: No results found. . bacitracin   Topical BID  . ciprofloxacin  500 mg Oral BID  . docusate sodium  100 mg Oral BID  . enoxaparin (LOVENOX) injection  60 mg Subcutaneous Q12H  . famotidine  20 mg Oral Daily  . feeding supplement (NEPRO CARB STEADY)  237 mL Oral Q24H  . feeding supplement (PRO-STAT SUGAR FREE 64)  30 mL Oral BID  . furosemide  160 mg Intravenous Daily  . polyethylene glycol  17 g Oral Daily    BMET    Component Value Date/Time   NA 123* 02/04/2016 0415   K 3.4* 02/04/2016 0415   CL 89* 02/04/2016 0415   CO2 22 02/04/2016 0415   GLUCOSE 118* 02/04/2016 0415   BUN 80* 02/04/2016 0415   CREATININE 2.77* 02/04/2016 0415   CALCIUM 8.9 02/04/2016 0415   GFRNONAA 31* 02/04/2016 0415   GFRAA 36* 02/04/2016 0415   CBC    Component Value Date/Time   WBC 28.5* 02/02/2016 0403   RBC 2.76* 02/02/2016 0403   HGB 7.7* 02/02/2016 0403   HCT 23.3* 02/02/2016 0403   PLT 408* 02/02/2016 0403   MCV 84.4 02/02/2016 0403   MCH  27.9 02/02/2016 0403   MCHC 33.0 02/02/2016 0403   RDW 15.4 02/02/2016 0403   LYMPHSABS 2.8 01/30/2016 0425   MONOABS 1.1* 01/30/2016 0425   EOSABS 0.0 01/30/2016 0425   BASOSABS 0.0 01/30/2016 0425     Assessment/Plan:  1. AKI due to ischemic ATN and CIN, non-oliguric.  1. Baseline Scr 1.5 at admission  2. CVVHD initiated from 6/26-01/24/16 3. Last HD was 01/26/16 with continued improvement of Scr 4. Hold off on further HD as he is showing signs of renal recovery.  2. hyponatremia- due to excessive free water intake and AKI, asymptomatic 1. improving 3. ABLA- cont to follow 4. Possible bladder leak s/p percutaneous drain, urology to evaluate 5. Leukocytosis- WBC continues to rise, plan per surgery  Irena CordsJoseph A Maude Gloor

## 2016-02-04 NOTE — Transfer of Care (Signed)
Immediate Anesthesia Transfer of Care Note  Patient: Samuel Owens  Procedure(s) Performed: Procedure(s): CYSTOSCOPY WITH RETROGRADE PYELOGRAM/URETERAL STENT PLACEMENT (Right)  Patient Location: PACU  Anesthesia Type:General  Level of Consciousness:  sedated, patient cooperative and responds to stimulation  Airway & Oxygen Therapy:Patient Spontanous Breathing and Patient connected to face mask oxgen  Post-op Assessment:  Report given to PACU RN and Post -op Vital signs reviewed and stable  Post vital signs:  Reviewed and stable  Last Vitals: There were no vitals filed for this visit.  Complications: No apparent anesthesia complications

## 2016-02-04 NOTE — Anesthesia Postprocedure Evaluation (Signed)
Anesthesia Post Note  Patient: Samuel Owens  Procedure(s) Performed: Procedure(s) (LRB): CYSTOSCOPY WITH RETROGRADE PYELOGRAM/URETERAL STENT PLACEMENT (Right)  Patient location during evaluation: PACU Anesthesia Type: General Level of consciousness: awake and alert Pain management: pain level controlled Vital Signs Assessment: post-procedure vital signs reviewed and stable Respiratory status: spontaneous breathing, nonlabored ventilation, respiratory function stable and patient connected to nasal cannula oxygen Cardiovascular status: blood pressure returned to baseline and stable Postop Assessment: no signs of nausea or vomiting Anesthetic complications: no    Last Vitals:  Filed Vitals:   02/04/16 0500 02/04/16 1343  BP: 155/98 151/102  Pulse: 106 95  Temp: 36.8 C 36.8 C  Resp: 16 16    Last Pain:  Filed Vitals:   02/04/16 1348  PainSc: Asleep                 Jadarian Mckay DAVID

## 2016-02-04 NOTE — Progress Notes (Signed)
OT Cancellation Note  Patient Details Name: Samuel PaganShiquan Owens MRN: [id number] DOB: [birth year]   Cancelled Treatment:    Reason Eval/Treat Not Completed: Patient declined, no reason specified;Other (comment). [facility], however declined OT today sttaing that he has already been moving around. Pt also waiting on lunch tray per nurse tech.  Margaretmary EddySpencer, Yeva Bissette [facility] LLCJeanette 02/04/2016, 2:07 PM

## 2016-02-04 NOTE — Progress Notes (Signed)
OT Cancellation Note  Patient Details Name: Samuel Owens MRN: [id number] DOB: 1995/09/27   Cancelled Treatment:    Reason Eval/Treat Not Completed: Patient at procedure or test/ unavailable. Will re attempt at a later time as appropriate  Galen ManilaSpencer, Torunn Chancellor Jeanette 02/04/2016, 10:20 AM

## 2016-02-04 NOTE — Progress Notes (Signed)
PT Cancellation Note  Patient Details Name: Samuel Owens MRN: [id number] DOB: 1996/03/26   Cancelled Treatment:    Reason Eval/Treat Not Completed: Patient at procedure or test/unavailable.  [facility]  Will f/u another time.     Sunny SchleinRitenour, Akisha Sturgill F, [place]PT [phone] 02/04/2016, 9:51 AM
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
## 2016-02-04 NOTE — Anesthesia Procedure Notes (Signed)
Procedure Name: Intubation Date/Time: 02/04/2016 11:05 AM Performed by: Wynonia SoursWALKER, Javen Ridings L Pre-anesthesia Checklist: Patient identified, Emergency Drugs available, Suction available, Patient being monitored and Timeout performed Patient Re-evaluated:Patient Re-evaluated prior to inductionOxygen Delivery Method: Circle system utilized Preoxygenation: Pre-oxygenation with 100% oxygen Intubation Type: IV induction Ventilation: Mask ventilation without difficulty Laryngoscope Size: Mac and 4 Grade View: Grade I Tube type: Oral Tube size: 7.5 mm Number of attempts: 1 Airway Equipment and Method: Stylet Placement Confirmation: ETT inserted through vocal cords under direct vision,  positive ETCO2,  CO2 detector and breath sounds checked- equal and bilateral Secured at: 22 cm Tube secured with: Tape Dental Injury: Teeth and Oropharynx as per pre-operative assessment
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
## 2016-02-05 DIAGNOSIS — D72829 Elevated white blood cell count, unspecified: Secondary | ICD-10-CM

## 2016-02-05 DIAGNOSIS — S36113D Laceration of liver, unspecified degree, subsequent encounter: Secondary | ICD-10-CM

## 2016-02-05 DIAGNOSIS — N179 Acute kidney failure, unspecified: Secondary | ICD-10-CM

## 2016-02-05 DIAGNOSIS — R000 Tachycardia, unspecified: Secondary | ICD-10-CM

## 2016-02-05 DIAGNOSIS — R188 Other ascites: Secondary | ICD-10-CM

## 2016-02-05 DIAGNOSIS — S2241XD Multiple fractures of ribs, right side, subsequent encounter for fracture with routine healing: Secondary | ICD-10-CM

## 2016-02-05 DIAGNOSIS — I158 Other secondary hypertension: Secondary | ICD-10-CM

## 2016-02-05 DIAGNOSIS — E871 Hypo-osmolality and hyponatremia: Secondary | ICD-10-CM

## 2016-02-05 DIAGNOSIS — K651 Peritoneal abscess: Secondary | ICD-10-CM

## 2016-02-05 DIAGNOSIS — E876 Hypokalemia: Secondary | ICD-10-CM

## 2016-02-05 DIAGNOSIS — R509 Fever, unspecified: Secondary | ICD-10-CM

## 2016-02-05 DIAGNOSIS — G8918 Other acute postprocedural pain: Secondary | ICD-10-CM

## 2016-02-05 DIAGNOSIS — K661 Hemoperitoneum: Secondary | ICD-10-CM

## 2016-02-05 LAB — CBC
HCT: 22.9 % — ABNORMAL LOW (ref 39.0–52.0)
Hemoglobin: 7.4 g/dL — ABNORMAL LOW (ref 13.0–17.0)
MCH: 27.8 pg (ref 26.0–34.0)
MCHC: 32.3 g/dL (ref 30.0–36.0)
MCV: 86.1 fL (ref 78.0–100.0)
Platelets: 475 10*3/uL — ABNORMAL HIGH (ref 150–400)
RBC: 2.66 MIL/uL — ABNORMAL LOW (ref 4.22–5.81)
RDW: 15.8 % — ABNORMAL HIGH (ref 11.5–15.5)
WBC: 38.5 10*3/uL — ABNORMAL HIGH (ref 4.0–10.5)

## 2016-02-05 LAB — APTT: aPTT: 45 seconds — ABNORMAL HIGH (ref 24–37)

## 2016-02-05 LAB — RENAL FUNCTION PANEL
Albumin: 2 g/dL — ABNORMAL LOW (ref 3.5–5.0)
Anion gap: 12 (ref 5–15)
BUN: 58 mg/dL — ABNORMAL HIGH (ref 6–20)
CO2: 23 mmol/L (ref 22–32)
Calcium: 8.8 mg/dL — ABNORMAL LOW (ref 8.9–10.3)
Chloride: 90 mmol/L — ABNORMAL LOW (ref 101–111)
Creatinine, Ser: 2.48 mg/dL — ABNORMAL HIGH (ref 0.61–1.24)
GFR calc Af Amer: 41 mL/min — ABNORMAL LOW (ref >60)
GFR calc non Af Amer: 35 mL/min — ABNORMAL LOW (ref >60)
Glucose, Bld: 142 mg/dL — ABNORMAL HIGH (ref 65–99)
Phosphorus: 4.6 mg/dL (ref 2.5–4.6)
Potassium: 3.2 mmol/L — ABNORMAL LOW (ref 3.5–5.1)
Sodium: 125 mmol/L — ABNORMAL LOW (ref 135–145)

## 2016-02-05 LAB — MAGNESIUM: Magnesium: 1.7 mg/dL (ref 1.7–2.4)

## 2016-02-05 NOTE — Progress Notes (Signed)
I met with pt and his girlfriend at bedside to discuss his rehab venue needs and options. I explained that I have limited beds available for admission this week and he would need to explore his other rehab options such as SNF rehab in [place] for any bed available as well as home with the support of his girlfriend. I discussed the need for him to participate more with therapy to demonstrate his capabilities and needs for more therapy or to be ready for d/c home. He states he can d/c home with his girlfriend  who is staying with him here in the hospital now or home with his Uncle. I discussed the consequences of blood clots if he does not move around more. He wishes to discuss with his physicians tomorrow that he feel he is ready to d/c home. I will follow up tomorrow. [phone]

## 2016-02-05 NOTE — Progress Notes (Signed)
Patient ID: Samuel Owens, male   DOB: 1995/12/03, 20 y.o.   MRN: [id number]    Referring Physician(s): DrMarland Kitchen Jimmye Norman  Supervising Physician: Gilmer Mor  Patient Status: inpt  Chief Complaint: Perihepatic abscess and renal abscess  Subjective: Patient with no complaints of pain.  Tired of all the "tubes."  Eating some breakfast.  Allergies: Review of patient's allergies indicates no known allergies.  Medications: Prior to Admission medications   Not on File    Vital Signs: BP 152/99 mmHg  Pulse 102  Temp(Src) 99.3 F (37.4 C) (Oral)  Resp 18  Ht  (1.727 m)  Wt 137 lb 1.6 oz (62.188 kg)  BMI 20.85 kg/m2  SpO2 100%  Physical Exam: Abd: right renal drain with clear light yellow output.  About 925cc total yesterday.  Perihepatic drain/anterior with purulent drainage still.  30cc/24hrs.  Both drain sites are c/d/i  Imaging: Dg Retrograde Pyelogram  02/04/2016  CLINICAL DATA:  20 year old male undergoing follow-up status post percutaneous nephrolithotomy. EXAM: RETROGRADE PYELOGRAM COMPARISON:  None. FINDINGS: Four fluoroscopic spot images demonstrate retrograde ureteropyelogram. A percutaneous drainage catheter projects over the right upper quadrant. This may be within the retroperitoneal space. There is a second drainage catheter which appears to be within the right upper pole renal collecting system. Small filling defects in the distal ureter may represent stone fragments or a small amount of thrombus. The subsequent images demonstrate placement of a double-J ureteral stent. The proximal loop is appropriately reconstituted and overlies the renal pelvis. The distal loop is not well seen. IMPRESSION: Retrograde ureteropyelogram and placement of double-J ureteral stent. Electronically Signed   By: Malachy Moan M.D.   On: 02/04/2016 12:39    Labs:  CBC:  Recent Labs  01/30/16 0425 01/31/16 0954 02/02/16 0403 02/05/16 0608  WBC 35.4* 25.9* 28.5* 38.5*    HGB 7.5* 7.5* 7.7* 7.4*  HCT 21.4* 22.1* 23.3* 22.9*  PLT 335 325 408* 475*    COAGS:  Recent Labs  01/06/16 0448 01/11/16 0635 01/13/16 1315  01/29/16 0630  02/01/16 0527 02/02/16 0403 02/04/16 0415 02/05/16 0608  INR 1.60* 1.34 1.46  --  1.68*  --   --   --   --   --   APTT 35  --   --   < > 41*  < > 47* 45* 44* 45*  < > = values in this interval not displayed.  BMP:  Recent Labs  02/03/16 1624 02/04/16 0415 02/04/16 1614 02/05/16 0608  NA 125* 123* 127* 125*  K 3.2* 3.4* 3.4* 3.2*  CL 90* 89* 92* 90*  CO2 21* GLUCOSE 128* 118* 107* 142*  BUN 86* 80* 69* 58*  CALCIUM 8.9 8.9 8.8* 8.8*  CREATININE 2.79* 2.77* 2.66* 2.48*  GFRNONAA 31* 31* 32* 35*  GFRAA 35* 36* 38* 41*    LIVER FUNCTION TESTS:  Recent Labs  01/04/16 0316 01/08/16 0359 01/14/16 0356  01/16/16 0830  02/03/16 1624 02/04/16 0415 02/04/16 1614 02/05/16 0608  BILITOT 1.5* 1.3* 0.7  --  0.8  --   --   --   --   --   AST 316* 206* 46*  --  42*  --   --   --   --   --   ALT 266* 646* 73*  --  53  --   --   --   --   --   ALKPHOS 45 84 60  --  82  --   --   --   --   --  PROT 5.0* 4.3* 5.6*  --  6.2*  --   --   --   --   --   ALBUMIN 2.8* 1.8* 1.1*  < > 1.2*  < > 2.3* 2.2* 2.1* 2.0*  < > = values in this interval not displayed.  Assessment and Plan: 1. S/p right renal drain and perihepatic drain placement, 7/11 -cont with right renal drain as this has a urine leak.  He is s/p a urologic procedure yesterday to help with this.  He now has an indwelling foley catheter in place. -anterior drain still with purulent drainage.  It is putting out about 30cc a day still.  Would plan for repeat CT scan once this output decreases to below 10-15cc a day. -WBC did spike to 35K today, etiology unclear, ? Irritation of things from his procedure yesterday.  Both drains did grow E. Coli.   -will follow  Electronically Signed: Elonda Giuliano E 02/05/2016, 10:24 AM   I spent a total of 15  Minutes at the the patient's bedside AND on the patient's hospital floor or unit, greater than 50% of which was counseling/coordinating care for right renal abscess, perihepatic abscess
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
## 2016-02-05 NOTE — Progress Notes (Signed)
Patient ID: Samuel Owens, male   DOB: 1995-10-06, 20 y.o.   MRN: [id number] S: events of last 24 hours noted with cysto O:BP 152/99 mmHg  Pulse 102  Temp(Src) 99.3 F (37.4 C) (Oral)  Resp 18  Ht 5\' 8"  (1.727 m)  Wt 62.188 kg (137 lb 1.6 oz)  BMI 20.85 kg/m2  SpO2 100%  Intake/Output Summary (Last 24 hours) at 02/05/16 0841 Last data filed at 02/05/16 0600  Gross per 24 hour  Intake   1020 ml  Output   3115 ml  Net  -2095 ml   Intake/Output: I/O last 3 completed shifts: In: 1110 [P.O.:1110] Out: 4030 [Urine:2880; Drains:1150]  Intake/Output this shift:    Weight change: 0.988 kg (2 lb 2.9 oz) Gen:WD AAM in NAD CVS:no rub Resp:cta Abd:+BS, some tenderness  Ext:no edema   Recent Labs Lab 02/02/16 0403 02/02/16 1538 02/03/16 1119 02/03/16 1624 02/04/16 0415 02/04/16 1614 02/05/16 0608  NA 125* 122* 124* 125* 123* 127* 125*  K 3.0* 3.4* 3.6 3.2* 3.4* 3.4* 3.2*  CL 88* 89* 92* 90* 89* 92* 90*  CO2 21* 20* 22 21* 22 26 23   GLUCOSE 143* 107* 117* 128* 118* 107* 142*  BUN 105* 99* 86* 86* 80* 69* 58*  CREATININE 4.02* 3.39* 2.99* 2.79* 2.77* 2.66* 2.48*  ALBUMIN 2.0* 2.3* 2.0* 2.3* 2.2* 2.1* 2.0*  CALCIUM 8.9 8.8* 8.9 8.9 8.9 8.8* 8.8*  PHOS 5.4* 5.4* 4.9* 5.0* 5.4* 5.4* 4.6   Liver Function Tests:  Recent Labs Lab 02/04/16 0415 02/04/16 1614 02/05/16 0608  ALBUMIN 2.2* 2.1* 2.0*   No results for input(s): LIPASE, AMYLASE in the last 168 hours. No results for input(s): AMMONIA in the last 168 hours. CBC:  Recent Labs Lab 01/30/16 0425 01/31/16 0954 02/02/16 0403 02/05/16 0608  WBC 35.4* 25.9* 28.5* 38.5*  NEUTROABS 31.5*  --   --   --   HGB 7.5* 7.5* 7.7* 7.4*  HCT 21.4* 22.1* 23.3* 22.9*  MCV 82.6 83.4 84.4 86.1  PLT 335 325 408* 475*   Cardiac Enzymes: No results for input(s): CKTOTAL, CKMB, CKMBINDEX, TROPONINI in the last 168 hours. CBG: No results for input(s): GLUCAP in the last 168 hours.  Iron Studies: No results for input(s):  IRON, TIBC, TRANSFERRIN, FERRITIN in the last 72 hours. Studies/Results: Dg Retrograde Pyelogram  02/04/2016  CLINICAL DATA:  20 year old male undergoing follow-up status post percutaneous nephrolithotomy. EXAM: RETROGRADE PYELOGRAM COMPARISON:  None. FINDINGS: Four fluoroscopic spot images demonstrate retrograde ureteropyelogram. A percutaneous drainage catheter projects over the right upper quadrant. This may be within the retroperitoneal space. There is a second drainage catheter which appears to be within the right upper pole renal collecting system. Small filling defects in the distal ureter may represent stone fragments or a small amount of thrombus. The subsequent images demonstrate placement of a double-J ureteral stent. The proximal loop is appropriately reconstituted and overlies the renal pelvis. The distal loop is not well seen. IMPRESSION: Retrograde ureteropyelogram and placement of double-J ureteral stent. Electronically Signed   By: Malachy MoanHeath  McCullough M.D.   On: 02/04/2016 12:39   . bacitracin   Topical BID  . ciprofloxacin  500 mg Oral BID  . docusate sodium  100 mg Oral BID  . enoxaparin (LOVENOX) injection  60 mg Subcutaneous Q12H  . famotidine  20 mg Oral Daily  . feeding supplement (NEPRO CARB STEADY)  237 mL Oral Q24H  . feeding supplement (PRO-STAT SUGAR FREE 64)  30 mL Oral BID  . furosemide  160 mg Intravenous Daily  . polyethylene glycol  17 g Oral Daily    BMET    Component Value Date/Time   NA 125* 02/05/2016 0608   K 3.2* 02/05/2016 0608   CL 90* 02/05/2016 0608   CO2 23 02/05/2016 0608   GLUCOSE 142* 02/05/2016 0608   BUN 58* 02/05/2016 0608   CREATININE 2.48* 02/05/2016 0608   CALCIUM 8.8* 02/05/2016 0608   GFRNONAA 35* 02/05/2016 0608   GFRAA 41* 02/05/2016 0608   CBC    Component Value Date/Time   WBC 38.5* 02/05/2016 0608   RBC 2.66* 02/05/2016 0608   HGB 7.4* 02/05/2016 0608   HCT 22.9* 02/05/2016 0608   PLT 475* 02/05/2016 0608   MCV 86.1  02/05/2016 0608   MCH 27.8 02/05/2016 0608   MCHC 32.3 02/05/2016 0608   RDW 15.8* 02/05/2016 0608   LYMPHSABS 2.8 01/30/2016 0425   MONOABS 1.1* 01/30/2016 0425   EOSABS 0.0 01/30/2016 0425   BASOSABS 0.0 01/30/2016 0425     Assessment/Plan:  1. AKI due to ischemic ATN and CIN, non-oliguric.  1. Baseline Scr 1.5 at admission  2. CVVHD initiated from 6/26-01/24/16 3. Last HD was 01/26/16 with continued improvement of Scr 4. No indication for further HD as he is showing signs of renal recovery. 5. Nothing further to add, will sign off.  Please call with questions or concerns. 6. He can f/u with Dr. Marisue Humble in our office once stable for discharge if his renal function does not return to normal.  2. hyponatremia- due to excessive free water intake and AKI, asymptomatic 1. improving 3. ABLA- cont to follow 4. Possible bladder leak s/p percutaneous drain, urology to evaluate 5. Leukocytosis- WBC continues to rise, plan per surgery  [name]
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
## 2016-02-06 ENCOUNTER — Inpatient Hospital Stay (HOSPITAL_COMMUNITY): Payer: Medicaid Other

## 2016-02-06 LAB — RENAL FUNCTION PANEL
Albumin: 1.9 g/dL — ABNORMAL LOW (ref 3.5–5.0)
Anion gap: 11 (ref 5–15)
BUN: 44 mg/dL — ABNORMAL HIGH (ref 6–20)
CO2: 23 mmol/L (ref 22–32)
Calcium: 8.6 mg/dL — ABNORMAL LOW (ref 8.9–10.3)
Chloride: 90 mmol/L — ABNORMAL LOW (ref 101–111)
Creatinine, Ser: 2.13 mg/dL — ABNORMAL HIGH (ref 0.61–1.24)
GFR calc Af Amer: 49 mL/min — ABNORMAL LOW (ref >60)
GFR calc non Af Amer: 42 mL/min — ABNORMAL LOW (ref >60)
Glucose, Bld: 127 mg/dL — ABNORMAL HIGH (ref 65–99)
Phosphorus: 4.6 mg/dL (ref 2.5–4.6)
Potassium: 3.3 mmol/L — ABNORMAL LOW (ref 3.5–5.1)
Sodium: 124 mmol/L — ABNORMAL LOW (ref 135–145)

## 2016-02-06 LAB — CBC
HCT: 23.1 % — ABNORMAL LOW (ref 39.0–52.0)
Hemoglobin: 7.4 g/dL — ABNORMAL LOW (ref 13.0–17.0)
MCH: 27.5 pg (ref 26.0–34.0)
MCHC: 32 g/dL (ref 30.0–36.0)
MCV: 85.9 fL (ref 78.0–100.0)
Platelets: 493 10*3/uL — ABNORMAL HIGH (ref 150–400)
RBC: 2.69 MIL/uL — ABNORMAL LOW (ref 4.22–5.81)
RDW: 15.6 % — ABNORMAL HIGH (ref 11.5–15.5)
WBC: 41.6 10*3/uL — ABNORMAL HIGH (ref 4.0–10.5)

## 2016-02-06 LAB — MAGNESIUM: Magnesium: 1.7 mg/dL (ref 1.7–2.4)

## 2016-02-06 MED ORDER — IOPAMIDOL (ISOVUE-300) INJECTION 61%
INTRAVENOUS | Status: AC
  Administered 2016-02-06: 75 mL
  Filled 2016-02-06: qty 75

## 2016-02-06 MED ORDER — RIVAROXABAN 15 MG PO TABS
15.0000 mg | ORAL_TABLET | Freq: Two times a day (BID) | ORAL | Status: DC
  Administered 2016-02-06: 15 mg via ORAL
  Filled 2016-02-06 (×2): qty 1

## 2016-02-06 MED ORDER — DIATRIZOATE MEGLUMINE & SODIUM 66-10 % PO SOLN
15.0000 mL | ORAL | Status: AC
  Administered 2016-02-06 (×2): 15 mL via ORAL
  Filled 2016-02-06: qty 30

## 2016-02-06 NOTE — Progress Notes (Signed)
Pt. Drank  3/4 of Contrast and all of Gastrografin. New IV placed. CT notified.

## 2016-02-06 NOTE — Discharge Instructions (Addendum)
Flush anterior drain with 10cc of NS twice a day.  Record output daily and keep track.  Bring this with you to your appointment.  Information on my medicine - XARELTO (rivaroxaban)  This medication education was reviewed with me or my healthcare representative as part of my discharge preparation.  The pharmacist that spoke with me during my hospital stay was:  Almon HerculesBaird, Haley P, [facility]  WHY WAS Carlena HurlXARELTO PRESCRIBED FOR YOU? Xarelto was prescribed to treat blood clots that may have been found in the veins of your legs (deep vein thrombosis) or in your lungs (pulmonary embolism) and to reduce the risk of them occurring again.  What do you need to know about Xarelto?   The starting dose is one 15 mg tablet taken TWICE daily with food for the FIRST 21 DAYS then on (enter date)  03/03/16  the dose is changed to one 20 mg tablet taken ONCE A DAY with your evening meal.  DO NOT stop taking Xarelto without talking to the health care provider who prescribed the medication.  Refill your prescription for 20 mg tablets before you run out.  After discharge, you should have regular check-up appointments with your healthcare provider that is prescribing your Xarelto.  In the future your dose may need to be changed if your kidney function changes by a significant amount.  What do you do if you miss a dose? If you are taking Xarelto TWICE DAILY and you miss a dose, take it as soon as you remember. You may take two 15 mg tablets (total 30 mg) at the same time then resume your regularly scheduled 15 mg twice daily the next day.  If you are taking Xarelto ONCE DAILY and you miss a dose, take it as soon as you remember on the same day then continue your regularly scheduled once daily regimen the next day. Do not take two doses of Xarelto at the same time.   Important Safety Information Xarelto is a blood thinner medicine that can cause bleeding. You should call your healthcare provider right away if you  experience any of the following: ? Bleeding from an injury or your nose that does not stop. ? Unusual colored urine (red or dark brown) or unusual colored stools (red or black). ? Unusual bruising for unknown reasons. ? A serious fall or if you hit your head (even if there is no bleeding).  Some medicines may interact with Xarelto and might increase your risk of bleeding while on Xarelto. To help avoid this, consult your healthcare provider or pharmacist prior to using any new prescription or non-prescription medications, including herbals, vitamins, non-steroidal anti-inflammatory drugs (NSAIDs) and supplements.  This website has more information on Xarelto: [email or web address].xarelto.com.   Wash wounds daily in shower with soap and water. Do not soak. Apply antibiotic ointment (e.g. Neosporin) twice daily and as needed to keep moist. Cover with dry dressing.
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
## 2016-02-06 NOTE — Progress Notes (Signed)
OT Cancellation Note  Patient Details Name: Samuel Owens MRN: [id number] DOB: 06/30/1996   Cancelled Treatment:    Reason Eval/Treat Not Completed: Other (comment)   Pt refused- stating he was nauseous.  Will check on pt next day.  MD aware  Samuel Owens, OT [phone]  Einar CrowEDDING, Deborah Dondero D 02/06/2016, 12:11 PM

## 2016-02-06 NOTE — Care Management Note (Signed)
Case Management Note  Patient Details  Name: Samuel Owens MRN: [id number] Date of Birth: 1995-10-22  Subjective/Objective:  Pt has been declined for CIR; wants to go home with family.  He needs HHRN follow up at home for foley, wound, and drain care, DME for home.                    Action/Plan: Met with pt and sister to discuss dc arrangements.  Pt states he is going to his grandmother and father's home in [place], [place].  Unfortunately, [facility] will not be able to see pt if he discharges to [place], as it is out of their service area.  [place], and they will not accept self pay or Medicaid patient for [facility] services.  Explained this to pt, and explained that family member will need to be taught drain, foley and wound care prior to his discharge tomorrow.  He states this will not be a problem.   Pt is discharging on Xarelto for upper extremity DVT; plan MATCH letter for free 30 days of Xarelto, with Rx 30 day coupon for 2nd 30 days free.  Pt to follow up at [facility] on Friday, July 21, at 12:00 for post-hospital visit/DVT f/u.  Appointment information on AVS.   Pt states he does not want hospital bed, and family has wheelchair at home for him to use.  He states he does not need RW, but will take [facility] for home use.  Will order DME to be delivered to pt's room on AM of dc.    Expected Discharge Date:   02/07/16               Expected Discharge Plan:  [place]  In-House Referral:  Clinical Social Work  Discharge planning Services  CM Consult  Post Acute Care Choice:  Durable Medical Equipment Choice offered to:  Patient  DME Arranged:  3-N-1 DME Agency:  [place]:    [place] Agency:     Status of Service:  In process, will continue to follow  If discussed at Long Length of Stay Meetings, dates discussed:    Additional Comments:  Reinaldo Raddle, RN, BSN  Trauma/Neuro  ICU Case Manager [phone]
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
## 2016-02-07 ENCOUNTER — Inpatient Hospital Stay (HOSPITAL_COMMUNITY): Payer: Medicaid Other

## 2016-02-07 ENCOUNTER — Encounter (HOSPITAL_COMMUNITY): Payer: Self-pay | Admitting: Neurology

## 2016-02-07 DIAGNOSIS — R569 Unspecified convulsions: Secondary | ICD-10-CM

## 2016-02-07 DIAGNOSIS — A4151 Sepsis due to Escherichia coli [E. coli]: Secondary | ICD-10-CM

## 2016-02-07 DIAGNOSIS — G40901 Epilepsy, unspecified, not intractable, with status epilepticus: Secondary | ICD-10-CM

## 2016-02-07 DIAGNOSIS — IMO0002 Reserved for concepts with insufficient information to code with codable children: Secondary | ICD-10-CM | POA: Insufficient documentation

## 2016-02-07 DIAGNOSIS — B9620 Unspecified Escherichia coli [E. coli] as the cause of diseases classified elsewhere: Secondary | ICD-10-CM

## 2016-02-07 LAB — CSF CELL COUNT WITH DIFFERENTIAL
Eosinophils, CSF: NONE SEEN % (ref 0–1)
Eosinophils, CSF: NONE SEEN % (ref 0–1)
RBC Count, CSF: 1 /mm3 — ABNORMAL HIGH
RBC Count, CSF: 1 /mm3 — ABNORMAL HIGH
Tube #: 1
Tube #: 4
WBC, CSF: 2 /mm3 (ref 0–5)
WBC, CSF: 2 /mm3 (ref 0–5)

## 2016-02-07 LAB — CBC WITH DIFFERENTIAL/PLATELET
Basophils Absolute: 0 10*3/uL (ref 0.0–0.1)
Basophils Relative: 0 %
Eosinophils Absolute: 0 10*3/uL (ref 0.0–0.7)
Eosinophils Relative: 0 %
HCT: 22.8 % — ABNORMAL LOW (ref 39.0–52.0)
Hemoglobin: 7.3 g/dL — ABNORMAL LOW (ref 13.0–17.0)
Lymphocytes Relative: 3 %
Lymphs Abs: 1.6 10*3/uL (ref 0.7–4.0)
MCH: 27.4 pg (ref 26.0–34.0)
MCHC: 32 g/dL (ref 30.0–36.0)
MCV: 85.7 fL (ref 78.0–100.0)
Monocytes Absolute: 1.1 10*3/uL — ABNORMAL HIGH (ref 0.1–1.0)
Monocytes Relative: 2 %
Neutro Abs: 51 10*3/uL — ABNORMAL HIGH (ref 1.7–7.7)
Neutrophils Relative %: 95 %
Platelets: 419 10*3/uL — ABNORMAL HIGH (ref 150–400)
RBC: 2.66 MIL/uL — ABNORMAL LOW (ref 4.22–5.81)
RDW: 15.3 % (ref 11.5–15.5)
WBC: 53.7 10*3/uL (ref 4.0–10.5)

## 2016-02-07 LAB — COMPREHENSIVE METABOLIC PANEL
ALT: 29 U/L (ref 17–63)
AST: 23 U/L (ref 15–41)
Albumin: 1.9 g/dL — ABNORMAL LOW (ref 3.5–5.0)
Alkaline Phosphatase: 121 U/L (ref 38–126)
Anion gap: 11 (ref 5–15)
BUN: 38 mg/dL — ABNORMAL HIGH (ref 6–20)
CO2: 24 mmol/L (ref 22–32)
Calcium: 9 mg/dL (ref 8.9–10.3)
Chloride: 91 mmol/L — ABNORMAL LOW (ref 101–111)
Creatinine, Ser: 2.06 mg/dL — ABNORMAL HIGH (ref 0.61–1.24)
GFR calc Af Amer: 51 mL/min — ABNORMAL LOW (ref >60)
GFR calc non Af Amer: 44 mL/min — ABNORMAL LOW (ref >60)
Glucose, Bld: 142 mg/dL — ABNORMAL HIGH (ref 65–99)
Potassium: 3.7 mmol/L (ref 3.5–5.1)
Sodium: 126 mmol/L — ABNORMAL LOW (ref 135–145)
Total Bilirubin: 0.3 mg/dL (ref 0.3–1.2)
Total Protein: 7.6 g/dL (ref 6.5–8.1)

## 2016-02-07 LAB — PROTEIN AND GLUCOSE, CSF
Glucose, CSF: 83 mg/dL — ABNORMAL HIGH (ref 40–70)
Total  Protein, CSF: 62 mg/dL — ABNORMAL HIGH (ref 15–45)

## 2016-02-07 LAB — PROTIME-INR
INR: 1.61 — ABNORMAL HIGH (ref 0.00–1.49)
Prothrombin Time: 19.1 seconds — ABNORMAL HIGH (ref 11.6–15.2)

## 2016-02-07 LAB — GLUCOSE, CAPILLARY
Glucose-Capillary: 172 mg/dL — ABNORMAL HIGH (ref 65–99)
Glucose-Capillary: 136 mg/dL — ABNORMAL HIGH (ref 65–99)
Glucose-Capillary: 116 mg/dL — ABNORMAL HIGH (ref 65–99)
Glucose-Capillary: 123 mg/dL — ABNORMAL HIGH (ref 65–99)

## 2016-02-07 LAB — OSMOLALITY: Osmolality: 276 mOsm/kg (ref 275–295)

## 2016-02-07 LAB — CK
Total CK: 22 U/L — ABNORMAL LOW (ref 49–397)
Total CK: 25 U/L — ABNORMAL LOW (ref 49–397)
Total CK: 37 U/L — ABNORMAL LOW (ref 49–397)

## 2016-02-07 LAB — LACTIC ACID, PLASMA: Lactic Acid, Venous: 1.7 mmol/L (ref 0.5–1.9)

## 2016-02-07 LAB — MAGNESIUM: Magnesium: 2 mg/dL (ref 1.7–2.4)

## 2016-02-07 LAB — OSMOLALITY, URINE: Osmolality, Ur: 409 mOsm/kg (ref 300–900)

## 2016-02-07 LAB — PROCALCITONIN: Procalcitonin: 0.71 ng/mL

## 2016-02-07 LAB — APTT: aPTT: 50 seconds — ABNORMAL HIGH (ref 24–37)

## 2016-02-07 LAB — PHOSPHORUS: Phosphorus: 5.4 mg/dL — ABNORMAL HIGH (ref 2.5–4.6)

## 2016-02-07 MED ORDER — LORAZEPAM 2 MG/ML IJ SOLN
1.0000 mg | INTRAMUSCULAR | Status: DC | PRN
  Administered 2016-02-07 (×2): 2 mg via INTRAVENOUS
  Filled 2016-02-07 (×2): qty 1

## 2016-02-07 MED ORDER — DEXTROSE 5 % IV SOLN
2.0000 g | INTRAVENOUS | Status: DC
  Administered 2016-02-07: 2 g via INTRAVENOUS
  Filled 2016-02-07 (×2): qty 2

## 2016-02-07 MED ORDER — DEXTROSE 5 % IV SOLN
2.0000 g | Freq: Two times a day (BID) | INTRAVENOUS | Status: DC
  Administered 2016-02-07 – 2016-02-11 (×8): 2 g via INTRAVENOUS
  Filled 2016-02-07 (×9): qty 2

## 2016-02-07 MED ORDER — GADOBENATE DIMEGLUMINE 529 MG/ML IV SOLN
13.0000 mL | Freq: Once | INTRAVENOUS | Status: AC | PRN
  Administered 2016-02-07: 13 mL via INTRAVENOUS

## 2016-02-07 MED ORDER — LORAZEPAM 2 MG/ML IJ SOLN
INTRAMUSCULAR | Status: AC
  Filled 2016-02-07: qty 1

## 2016-02-07 MED ORDER — LORAZEPAM 2 MG/ML IJ SOLN
2.0000 mg | Freq: Once | INTRAMUSCULAR | Status: AC
Start: 2016-02-07 — End: 2016-02-07
  Administered 2016-02-07: 2 mg via INTRAVENOUS

## 2016-02-07 MED ORDER — CEFAZOLIN IN D5W 1 GM/50ML IV SOLN
1.0000 g | Freq: Three times a day (TID) | INTRAVENOUS | Status: DC
  Filled 2016-02-07: qty 50

## 2016-02-07 MED ORDER — SODIUM CHLORIDE 0.9 % IV SOLN
20.0000 mg/kg | INTRAVENOUS | Status: DC
  Filled 2016-02-07 (×2): qty 25.52

## 2016-02-07 MED ORDER — DEXTROSE 5 % IV SOLN
640.0000 mg | Freq: Two times a day (BID) | INTRAVENOUS | Status: DC
  Administered 2016-02-07 – 2016-02-08 (×2): 640 mg via INTRAVENOUS
  Filled 2016-02-07 (×4): qty 12.8

## 2016-02-07 MED ORDER — HALOPERIDOL LACTATE 5 MG/ML IJ SOLN
INTRAMUSCULAR | Status: AC
  Filled 2016-02-07: qty 1

## 2016-02-07 MED ORDER — LEVETIRACETAM 500 MG/5ML IV SOLN
1500.0000 mg | INTRAVENOUS | Status: AC
  Administered 2016-02-07: 1500 mg via INTRAVENOUS
  Filled 2016-02-07: qty 15

## 2016-02-07 NOTE — Progress Notes (Signed)
Received order to continue EEG as LTM. [facility] notified.

## 2016-02-07 NOTE — Progress Notes (Signed)
Patient refusing to allow Lab in to draw lab sample. Spoke with patient and educated on reasons for need for labs. Patient then let lab obtain sample

## 2016-02-07 NOTE — Progress Notes (Signed)
OT Cancellation Note  Patient Details Name: Samuel Owens MRN: [id number] DOB: [birth year]   Cancelled Treatment:    Reason Eval/Treat Not Completed: Medical issues which prohibited therapy (Decline in medical status. Pt transferred to 57M. Will hold off on OT tx at this time. Will check back tomorrow if schedule allows.)  Nils PyleJulia Rosellen Lichtenberger, OTR/L Pager: [phone] 02/07/2016, 7:48 AM

## 2016-02-07 NOTE — Progress Notes (Signed)
LTM EEG stopped, electrodes removed in preparation for pt to go to MRI.

## 2016-02-07 NOTE — Progress Notes (Signed)
PT back from MRI- electrodes glued on. Dr Amada JupiterKirkpatrick notified.

## 2016-02-07 NOTE — Significant Event (Signed)
Rapid Response Event Note  Overview: Time Called: 0618 Arrival Time: 0619 Event Type: Neurologic  Initial Focused Assessment: Seizure   Interventions: Ativan   Plan of Care (if not transferred):  Event Summary:  Called to assist with care of patient having a seizure. On arrival, patient was unresponsive with eyes open gazed to the left. Patient appeared to still be having seizure activity. While at bedside patient experienced 2 more seizure. O2 sats decreased to 79% and was placed on NRB mask. CBG was 172. Rectal temp of 100.3. Patient remains postictal. Seizure precautions were requested. Patient was given Ativan 2mg  IVP for total of 4 before the seizure activity stopped. The on-call provider was made aware of patients condition and orders were received. Patient is now awaiting a CT of head and labs.               Beryl MeagerHarbison, [place] InksterLamond
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
## 2016-02-07 NOTE — Progress Notes (Signed)
Continuous EEG screen frozen MD notified.
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
## 2016-02-07 NOTE — Progress Notes (Signed)
PT Cancellation Note  Patient Details Name: Samuel PaganShiquan XXXmoore MRN: [id number] DOB: 21-Jul-1996   Cancelled Treatment:    Reason Eval/Treat Not Completed: Medical issues which prohibited therapy. Noted rapid response called this morning as pt having a seizure. Pt transferred to [facility]M. Will hold PT today and check back tomorrow for medical appropriateness to participate.    Conni SlipperKirkman, Jerrett Baldinger 02/07/2016, 7:47 AM   Conni SlipperLaura Trini Christiansen, PT, DPT Acute Rehabilitation Services Pager: [phone]
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
## 2016-02-07 NOTE — Progress Notes (Signed)
Started EEG, called neurohospitalist to view.  Recommended to do prolonged EEG at this point, until further notice.

## 2016-02-07 NOTE — Progress Notes (Signed)
LP procedure by D.Smith PA. Patient tolerated well.

## 2016-02-07 NOTE — Procedures (Signed)
Indication: Meningitis  Risks of the procedure were dicussed with the patient father including post-LP headache, bleeding, infection, weakness/numbness of legs(radiculopathy), death.  The patient's proxy agreed and written consent was obtained.   The patient was prepped and draped, and using sterile technique a 20 gauge quinke spinal needle was inserted in the L3/4 space. The opening pressure was 11 mm Hg. Approximately 8 cc of CSF were obtained and sent for analysis. No complications were encountered and patient handled procedure well.   Felicie MornDavid Smith PA-C [facility] [phone]  02/07/2016, 11:04 AM
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
## 2016-02-07 NOTE — Progress Notes (Signed)
Pt started having seizure again. Gave another 2mg  of ativan. Pt o2 went down to 81 pt was put on nonrebreathing mask, sat went to 100%. Dr. Donell BeersByerly ordered pt to transfer to ICU. Pt currently on post ictal status. Recent VS BP=149/86 P=137 O2 100% non rebreather mask at 15L RR of 20. Awaiting for bed.

## 2016-02-07 NOTE — Progress Notes (Signed)
This RN was called by pt's family member and said that she thinks pt's i shaving seizures. Pt was  Found jerking movement on the bed. Called Rapid response RN. Called MD given x1 2mg  of ativan ordered to repeat 5 mins lated if don't work. VS BP 169/112 P=139, RR=24 o2=99 on RA.

## 2016-02-07 NOTE — Progress Notes (Signed)
Met RRT RN and 6N RN in CT scan with patient - patient remains post-ictal - resps reg and unlabored - on NRB mask - beginning to spont move all extremities - opening eyes  no focusing - ST 143 - tol CT scan - transferred to 2M11 without incident - handoff to [facility].

## 2016-02-08 ENCOUNTER — Inpatient Hospital Stay: Payer: Self-pay

## 2016-02-08 ENCOUNTER — Encounter (HOSPITAL_COMMUNITY): Payer: Self-pay | Admitting: *Deleted

## 2016-02-08 ENCOUNTER — Inpatient Hospital Stay (HOSPITAL_COMMUNITY): Payer: Medicaid Other

## 2016-02-08 DIAGNOSIS — A419 Sepsis, unspecified organism: Secondary | ICD-10-CM

## 2016-02-08 LAB — RENAL FUNCTION PANEL
Albumin: 1.9 g/dL — ABNORMAL LOW (ref 3.5–5.0)
Anion gap: 9 (ref 5–15)
BUN: 38 mg/dL — ABNORMAL HIGH (ref 6–20)
CO2: 26 mmol/L (ref 22–32)
Calcium: 8.8 mg/dL — ABNORMAL LOW (ref 8.9–10.3)
Chloride: 93 mmol/L — ABNORMAL LOW (ref 101–111)
Creatinine, Ser: 1.81 mg/dL — ABNORMAL HIGH (ref 0.61–1.24)
GFR calc Af Amer: 60 mL/min — ABNORMAL LOW (ref >60)
GFR calc non Af Amer: 52 mL/min — ABNORMAL LOW (ref >60)
Glucose, Bld: 106 mg/dL — ABNORMAL HIGH (ref 65–99)
Phosphorus: 4.2 mg/dL (ref 2.5–4.6)
Potassium: 3.2 mmol/L — ABNORMAL LOW (ref 3.5–5.1)
Sodium: 128 mmol/L — ABNORMAL LOW (ref 135–145)

## 2016-02-08 LAB — CBC WITH DIFFERENTIAL/PLATELET
Basophils Absolute: 0 10*3/uL (ref 0.0–0.1)
Basophils Relative: 0 %
Eosinophils Absolute: 0 10*3/uL (ref 0.0–0.7)
Eosinophils Relative: 0 %
HCT: 21.8 % — ABNORMAL LOW (ref 39.0–52.0)
Hemoglobin: 7.1 g/dL — ABNORMAL LOW (ref 13.0–17.0)
Lymphocytes Relative: 5 %
Lymphs Abs: 2 10*3/uL (ref 0.7–4.0)
MCH: 28 pg (ref 26.0–34.0)
MCHC: 32.6 g/dL (ref 30.0–36.0)
MCV: 85.8 fL (ref 78.0–100.0)
Monocytes Absolute: 1.6 10*3/uL — ABNORMAL HIGH (ref 0.1–1.0)
Monocytes Relative: 4 %
Neutro Abs: 37.3 10*3/uL — ABNORMAL HIGH (ref 1.7–7.7)
Neutrophils Relative %: 91 %
Platelets: 428 10*3/uL — ABNORMAL HIGH (ref 150–400)
RBC: 2.54 MIL/uL — ABNORMAL LOW (ref 4.22–5.81)
RDW: 15.4 % (ref 11.5–15.5)
Smear Review: ADEQUATE
WBC: 40.9 10*3/uL — ABNORMAL HIGH (ref 4.0–10.5)

## 2016-02-08 LAB — ECHOCARDIOGRAM COMPLETE
Height: 68 in
Weight: 2158.74 oz

## 2016-02-08 LAB — GLUCOSE, CAPILLARY
Glucose-Capillary: 108 mg/dL — ABNORMAL HIGH (ref 65–99)
Glucose-Capillary: 120 mg/dL — ABNORMAL HIGH (ref 65–99)

## 2016-02-08 LAB — MAGNESIUM: Magnesium: 2 mg/dL (ref 1.7–2.4)

## 2016-02-08 LAB — PROCALCITONIN: Procalcitonin: 1.6 ng/mL

## 2016-02-08 MED ORDER — DEXTROSE 5 % IV SOLN
640.0000 mg | Freq: Three times a day (TID) | INTRAVENOUS | Status: DC
  Administered 2016-02-08 – 2016-02-11 (×10): 640 mg via INTRAVENOUS
  Filled 2016-02-08 (×16): qty 12.8

## 2016-02-08 MED ORDER — POTASSIUM CHLORIDE CRYS ER 20 MEQ PO TBCR
20.0000 meq | EXTENDED_RELEASE_TABLET | Freq: Two times a day (BID) | ORAL | Status: AC
Start: 2016-02-08 — End: 2016-02-09
  Administered 2016-02-08: 20 meq via ORAL
  Filled 2016-02-08 (×2): qty 1

## 2016-02-08 MED ORDER — HYDRALAZINE HCL 20 MG/ML IJ SOLN: 10.0000 mg | INTRAMUSCULAR | Status: DC | PRN

## 2016-02-08 MED ORDER — SODIUM CHLORIDE 0.9 % IV SOLN
500.0000 mg | Freq: Two times a day (BID) | INTRAVENOUS | Status: DC
  Administered 2016-02-08 – 2016-02-10 (×6): 500 mg via INTRAVENOUS
  Filled 2016-02-08 (×8): qty 5

## 2016-02-08 MED ORDER — OXYCODONE HCL 5 MG PO TABS
5.0000 mg | ORAL_TABLET | ORAL | Status: DC | PRN
  Administered 2016-02-08 – 2016-02-12 (×10): 15 mg via ORAL
  Filled 2016-02-08 (×10): qty 3

## 2016-02-08 MED ORDER — METOPROLOL TARTRATE 25 MG PO TABS
25.0000 mg | ORAL_TABLET | Freq: Two times a day (BID) | ORAL | Status: DC
  Administered 2016-02-08 – 2016-02-10 (×6): 25 mg via ORAL
  Filled 2016-02-08 (×6): qty 1

## 2016-02-08 MED ORDER — HYDRALAZINE HCL 20 MG/ML IJ SOLN
10.0000 mg | INTRAMUSCULAR | Status: DC | PRN
  Administered 2016-02-10: 10 mg via INTRAVENOUS
  Filled 2016-02-08: qty 1
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
## 2016-02-08 NOTE — Progress Notes (Signed)
OT Cancellation Note  Patient Details Name: Samuel Owens MRN: [id number] DOB: 1995-08-07   Cancelled Treatment:    Reason Eval/Treat Not Completed: Patient at procedure or test/ unavailable  [facility]  Samuel Owens, OTR/L  [phone] 02/08/2016 02/08/2016, 2:33 PM

## 2016-02-08 NOTE — Progress Notes (Signed)
Noted events of last 24 hrs with seizures prior to planned d/c home. We will follow up next week with pt. He has not agreed to any inpt rehab at this time, nor has he been fully participatory to be a candidate to admit to date. Previously said he would d/c home with Uncle or his girlfriend. [phone]
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
## 2016-02-08 NOTE — Procedures (Signed)
Electroencephalogram report- LTM  Ordering Physician : Dr. Amada JupiterKirkpatrick  Beginning date and time: 02/07/2016 8:45AM Ending date and time: 02/08/2016 07:30AM  Day of study: 1  HISTORY: This 24 hours of intensive EEG monitoring with simultaneous video monitoring was performed for this patient with unresponsiveness and witnessed convulsive seizures. Patient is now intubated and sedated. This EEG was requested to rule out subclinical electrographic seizures.  TECHNICAL DESCRIPTION: The study consists of a continuous 16-channel multi-montage digital video EEG recording with twenty-one electrodes placed according to the International 10-20 System. Additional leads included eye leads, true temporal leads (T1, T2), and an EKG lead. Activation procedures were not done due to mental status.  REPORT: The background activity consists of polymorphic delta and theta activity. At the beginning of the recording, frequent rhythmic delta activity with intermixed spikes was seen in the right hemispheric region, consistent with electrographic seizures. In between the seizures, 0.5-1Hz  periodic spikes (PLEDS) in the right temporal region, were seen. This improves around 11:30AM, with no clear electrographic seizures seen.   There were no pushbutton activations events during this recording.   INTERPRETATION: This is an abnormal EEG due to: 1) PLEDS and frequent electrographic seizures in the right temporal region in the early part of the recording 2) Mild diffuse background slowing in the later part of the recording  CLINICAL CORRELATION: This EEG is consistent with focal electrographic status epilepticus involving right posterior temporal region in the early part of the recording, that resolves. Clinical correlation is required.
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
## 2016-02-08 NOTE — Progress Notes (Signed)
Echocardiogram 2D Echocardiogram has been performed.  Samuel Owens 02/08/2016, 1:46 PM

## 2016-02-08 NOTE — Progress Notes (Signed)
Subjective: Much improved today.   Exam: Filed Vitals:   02/08/16 0700 02/08/16 0853  BP: 162/105   Pulse:    Temp:  98.7 F (37.1 C)  Resp: 20    Gen: In bed, NAD Resp: non-labored breathing, no acute distress Abd: drain in place on the right.   Neuro: MS: awake, alert, oriented and appropriate.  ZO:XWRUECN:PERRL, VFF Motor: he has full strength on the right, on the left he has weakness of abduction/hip flexion in the left leg with relatively preserved knee flexion.  Sensory:intact to LT  Pertinent Labs: gfr 60  Impression: 20 yo M with new onset focal right hemispheric status epilepticus which broke easily with ativan. He is now seizure free on keppra. I do wonder about PRES, though the unilateral nature would be unusual. Given the unusual appearance on MRI, I have covered him with though could stop this if PCR negative. I have also changed him from ancef to a cephalosporin with CNS penetration, though likelihood of this is low given CSF, if cultures are negative could change back to dosing for his other indications.   It does not have the appearance of septic infarcts.   Recommendations: 1) continue keppra 500mg  BID 2) would repeat MRI in 2 - 3 days.  3) BP control in case this is PRES, would shoot for normotension. I have changed hydralazine PRN to < 150 SBP  Ritta SlotMcNeill Layliana Devins, MD [facility] [phone]  If 7pm- 7am, please page neurology on call as listed in AMION.

## 2016-02-08 NOTE — Progress Notes (Signed)
    [facility]   Reason for visit: Follow up on leukocytosis  Interval History: no further seizures  Physical Exam: Constitutional:  Filed Vitals:   02/08/16 1136 02/08/16 1433  BP:  141/93  Pulse:  105  Temp: 99 F (37.2 C) 98.8 F (37.1 C)  Resp:  20   patient appears in NAD Respiratory: Normal respiratory effort; CTA B Cardiovascular: RRR  Review of Systems: Constitutional: negative for fevers and chills  Lab Results  Component Value Date   WBC 40.9* 02/08/2016   HGB 7.1* 02/08/2016   HCT 21.8* 02/08/2016   MCV 85.8 02/08/2016   PLT 428* 02/08/2016    Lab Results  Component Value Date   CREATININE 1.81* 02/08/2016   BUN 38* 02/08/2016   NA 128* 02/08/2016   K 3.2* 02/08/2016   CL 93* 02/08/2016   CO2 26 02/08/2016    Lab Results  Component Value Date   ALT 29 02/07/2016   AST 23 02/07/2016   ALKPHOS 121 02/07/2016     Microbiology: Recent Results (from the past 240 hour(s))  CSF culture with Stat gram stain     Status: None (Preliminary result)   Collection Time: 02/07/16 11:00 AM  Result Value Ref Range Status   Specimen Description CSF  Final   Special Requests Normal  Final   Gram Stain   Final    CYTOSPIN SMEAR WBC PRESENT,BOTH PMN AND MONONUCLEAR NO ORGANISMS SEEN    Culture NO GROWTH 1 DAY  Final   Report Status PENDING  Incomplete  Culture, blood (Routine X 2) w Reflex to ID Panel     Status: None (Preliminary result)   Collection Time: 02/07/16  2:50 PM  Result Value Ref Range Status   Specimen Description BLOOD RIGHT HAND  Final   Special Requests BOTTLES DRAWN AEROBIC AND ANAEROBIC 5CC  Final   Culture NO GROWTH < 24 HOURS  Final   Report Status PENDING  Incomplete  Culture, blood (Routine X 2) w Reflex to ID Panel     Status: None (Preliminary result)   Collection Time: 02/07/16  3:00 PM  Result Value Ref Range Status   Specimen Description LEFT ANTECUBITAL  Final   Special Requests IN PEDIATRIC BOTTLE  3CC  Final   Culture NO GROWTH < 24 HOURS  Final   Report Status PENDING  Incomplete    Impression/Plan:  1. Leukocytosis - no clear etiology, does not appear to be related to infection.  I would suspect mainly from seizures. Will continue with ceftriaxone though pending CSF cultures.  Down to about 40,000 today.  2. E coli in abdominal fluid - some WBCs, on treatment with ceftriaxone.  Will continue for now.    Dr. Ninetta LightsHatcher will follow up over the weekend.

## 2016-02-09 LAB — CBC
HCT: 20.5 % — ABNORMAL LOW (ref 39.0–52.0)
Hemoglobin: 6.8 g/dL — CL (ref 13.0–17.0)
MCH: 27.9 pg (ref 26.0–34.0)
MCHC: 32.7 g/dL (ref 30.0–36.0)
MCV: 85.4 fL (ref 78.0–100.0)
Platelets: 404 10*3/uL — ABNORMAL HIGH (ref 150–400)
RBC: 2.4 MIL/uL — ABNORMAL LOW (ref 4.22–5.81)
RDW: 15.5 % (ref 11.5–15.5)
WBC: 35 10*3/uL — ABNORMAL HIGH (ref 4.0–10.5)

## 2016-02-09 LAB — BASIC METABOLIC PANEL
Anion gap: 9 (ref 5–15)
BUN: 37 mg/dL — ABNORMAL HIGH (ref 6–20)
CO2: 25 mmol/L (ref 22–32)
Calcium: 8.3 mg/dL — ABNORMAL LOW (ref 8.9–10.3)
Chloride: 96 mmol/L — ABNORMAL LOW (ref 101–111)
Creatinine, Ser: 1.53 mg/dL — ABNORMAL HIGH (ref 0.61–1.24)
GFR calc Af Amer: >60 mL/min (ref >60)
GFR calc non Af Amer: >60 mL/min (ref >60)
Glucose, Bld: 130 mg/dL — ABNORMAL HIGH (ref 65–99)
Potassium: 2.9 mmol/L — ABNORMAL LOW (ref 3.5–5.1)
Sodium: 130 mmol/L — ABNORMAL LOW (ref 135–145)

## 2016-02-09 LAB — PROCALCITONIN: Procalcitonin: 1.14 ng/mL

## 2016-02-09 LAB — PREPARE RBC (CROSSMATCH)

## 2016-02-09 MED ORDER — SODIUM CHLORIDE 0.9 % IV SOLN: Freq: Once | INTRAVENOUS | Status: DC
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
## 2016-02-09 NOTE — Progress Notes (Signed)
Notified Dr.  Violeta Gelinas of critical lab hgb 6.8. New order received.

## 2016-02-10 LAB — BASIC METABOLIC PANEL
Anion gap: 7 (ref 5–15)
BUN: 22 mg/dL — ABNORMAL HIGH (ref 6–20)
CO2: 26 mmol/L (ref 22–32)
Calcium: 8.2 mg/dL — ABNORMAL LOW (ref 8.9–10.3)
Chloride: 97 mmol/L — ABNORMAL LOW (ref 101–111)
Creatinine, Ser: 1.29 mg/dL — ABNORMAL HIGH (ref 0.61–1.24)
GFR calc Af Amer: >60 mL/min (ref >60)
GFR calc non Af Amer: >60 mL/min (ref >60)
Glucose, Bld: 121 mg/dL — ABNORMAL HIGH (ref 65–99)
Potassium: 2.9 mmol/L — ABNORMAL LOW (ref 3.5–5.1)
Sodium: 130 mmol/L — ABNORMAL LOW (ref 135–145)

## 2016-02-10 LAB — CBC WITH DIFFERENTIAL/PLATELET
Basophils Absolute: 0 10*3/uL (ref 0.0–0.1)
Basophils Relative: 0 %
Eosinophils Absolute: 0.3 10*3/uL (ref 0.0–0.7)
Eosinophils Relative: 1 %
HCT: 24.1 % — ABNORMAL LOW (ref 39.0–52.0)
Hemoglobin: 7.7 g/dL — ABNORMAL LOW (ref 13.0–17.0)
Lymphocytes Relative: 5 %
Lymphs Abs: 1.4 10*3/uL (ref 0.7–4.0)
MCH: 27.6 pg (ref 26.0–34.0)
MCHC: 32 g/dL (ref 30.0–36.0)
MCV: 86.4 fL (ref 78.0–100.0)
Monocytes Absolute: 1.7 10*3/uL — ABNORMAL HIGH (ref 0.1–1.0)
Monocytes Relative: 6 %
Neutro Abs: 24.5 10*3/uL — ABNORMAL HIGH (ref 1.7–7.7)
Neutrophils Relative %: 88 %
Platelets: 347 10*3/uL (ref 150–400)
RBC: 2.79 MIL/uL — ABNORMAL LOW (ref 4.22–5.81)
RDW: 15.3 % (ref 11.5–15.5)
WBC: 27.9 10*3/uL — ABNORMAL HIGH (ref 4.0–10.5)

## 2016-02-10 LAB — HSV(HERPES SMPLX VRS)ABS-I+II(IGG)-CSF: HSV Type I/II Ab, IgG CSF: 3.87 IV — ABNORMAL HIGH (ref <=0.89)

## 2016-02-10 MED ORDER — POTASSIUM CHLORIDE CRYS ER 10 MEQ PO TBCR
10.0000 meq | EXTENDED_RELEASE_TABLET | Freq: Two times a day (BID) | ORAL | Status: DC
  Administered 2016-02-10 – 2016-02-12 (×4): 10 meq via ORAL
  Filled 2016-02-10 (×6): qty 1

## 2016-02-10 NOTE — Clinical Social Work Note (Signed)
CSW spoke with pt about CIR. Pt now agreeable to CIR. However, based on review of pt's chart pt is refusing to get up and ambulate. Pt has stated he will walk once he gets home. Most appropriate plan for pt would be to discharge home with girlfriend.
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
## 2016-02-10 NOTE — Progress Notes (Signed)
Pt not willing to get up and ambulate as ordered by the MD. States ' I will walk when I get home'. Attempts to persuade pt to get up and walk from family and staff unsuccessful at this time
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
## 2016-02-11 ENCOUNTER — Inpatient Hospital Stay (HOSPITAL_COMMUNITY): Payer: Medicaid Other

## 2016-02-11 DIAGNOSIS — L0291 Cutaneous abscess, unspecified: Secondary | ICD-10-CM

## 2016-02-11 LAB — HERPES SIMPLEX VIRUS(HSV) DNA BY PCR
HSV 1 DNA: NEGATIVE
HSV 2 DNA: NEGATIVE

## 2016-02-11 LAB — BASIC METABOLIC PANEL
Anion gap: 7 (ref 5–15)
BUN: 16 mg/dL (ref 6–20)
CO2: 25 mmol/L (ref 22–32)
Calcium: 8.4 mg/dL — ABNORMAL LOW (ref 8.9–10.3)
Chloride: 99 mmol/L — ABNORMAL LOW (ref 101–111)
Creatinine, Ser: 1.14 mg/dL (ref 0.61–1.24)
GFR calc Af Amer: >60 mL/min (ref >60)
GFR calc non Af Amer: >60 mL/min (ref >60)
Glucose, Bld: 135 mg/dL — ABNORMAL HIGH (ref 65–99)
Potassium: 3.2 mmol/L — ABNORMAL LOW (ref 3.5–5.1)
Sodium: 131 mmol/L — ABNORMAL LOW (ref 135–145)

## 2016-02-11 LAB — CBC
HCT: 24.9 % — ABNORMAL LOW (ref 39.0–52.0)
Hemoglobin: 7.9 g/dL — ABNORMAL LOW (ref 13.0–17.0)
MCH: 27.6 pg (ref 26.0–34.0)
MCHC: 31.7 g/dL (ref 30.0–36.0)
MCV: 87.1 fL (ref 78.0–100.0)
Platelets: 315 10*3/uL (ref 150–400)
RBC: 2.86 MIL/uL — ABNORMAL LOW (ref 4.22–5.81)
RDW: 15.6 % — ABNORMAL HIGH (ref 11.5–15.5)
WBC: 19.9 10*3/uL — ABNORMAL HIGH (ref 4.0–10.5)

## 2016-02-11 LAB — CSF CULTURE W GRAM STAIN
Culture: NO GROWTH
Special Requests: NORMAL

## 2016-02-11 MED ORDER — RIVAROXABAN 15 MG PO TABS
15.0000 mg | ORAL_TABLET | Freq: Two times a day (BID) | ORAL | Status: DC
  Administered 2016-02-11 – 2016-02-12 (×3): 15 mg via ORAL
  Filled 2016-02-11 (×3): qty 1

## 2016-02-11 MED ORDER — MUPIROCIN CALCIUM 2 % EX CREA: TOPICAL_CREAM | Freq: Three times a day (TID) | CUTANEOUS | Status: DC

## 2016-02-11 MED ORDER — IOPAMIDOL (ISOVUE-300) INJECTION 61%
INTRAVENOUS | Status: AC
  Administered 2016-02-11: 100 mL
  Filled 2016-02-11: qty 100

## 2016-02-11 MED ORDER — LEVETIRACETAM 500 MG PO TABS
500.0000 mg | ORAL_TABLET | Freq: Two times a day (BID) | ORAL | Status: DC
  Administered 2016-02-11 – 2016-02-12 (×3): 500 mg via ORAL
  Filled 2016-02-11 (×4): qty 1

## 2016-02-11 MED ORDER — METOPROLOL TARTRATE 50 MG PO TABS
50.0000 mg | ORAL_TABLET | Freq: Two times a day (BID) | ORAL | Status: DC
  Administered 2016-02-11 – 2016-02-12 (×3): 50 mg via ORAL
  Filled 2016-02-11 (×3): qty 1

## 2016-02-11 MED ORDER — CEPHALEXIN 500 MG PO CAPS
500.0000 mg | ORAL_CAPSULE | Freq: Three times a day (TID) | ORAL | Status: DC
  Administered 2016-02-11 – 2016-02-12 (×3): 500 mg via ORAL
  Filled 2016-02-11 (×2): qty 1

## 2016-02-11 MED ORDER — DIATRIZOATE MEGLUMINE & SODIUM 66-10 % PO SOLN
ORAL | Status: AC
  Administered 2016-02-11: 12:00:00
  Filled 2016-02-11: qty 30
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
## 2016-02-11 NOTE — Progress Notes (Signed)
    [facility]   Reason for visit: Follow up on seizures, concern for infection  Interval History: HSV CSF antibodies noted but PCR pending; no new issues;   Physical Exam: Constitutional:  Vitals:   02/11/16 0650 02/11/16 1251  BP: (!) 142/94 (!) 117/104  Pulse: (!) 103 92  Resp: 19 18  Temp: 98.8 F (37.1 C) 98.9 F (37.2 C)   patient appears in NAD; does not speak to me though does shake head yes or no appropriately Respiratory: Normal respiratory effort; CTA B Cardiovascular: RRR  Review of Systems: Constitutional: negative for fevers and chills Gastrointestinal: negative for diarrhea  Lab Results  Component Value Date   WBC 19.9 (H) 02/11/2016   HGB 7.9 (L) 02/11/2016   HCT 24.9 (L) 02/11/2016   MCV 87.1 02/11/2016   PLT 315 02/11/2016    Lab Results  Component Value Date   CREATININE 1.14 02/11/2016   BUN 16 02/11/2016   NA 131 (L) 02/11/2016   K 3.2 (L) 02/11/2016   CL 99 (L) 02/11/2016   CO2 25 02/11/2016    Lab Results  Component Value Date   ALT 29 02/07/2016   AST 23 02/07/2016   ALKPHOS 121 02/07/2016     Microbiology: Recent Results (from the past 240 hour(s))  CSF culture with Stat gram stain     Status: None   Collection Time: 02/07/16 11:00 AM  Result Value Ref Range Status   Specimen Description CSF  Final   Special Requests Normal  Final   Gram Stain   Final    CYTOSPIN SMEAR WBC PRESENT,BOTH PMN AND MONONUCLEAR NO ORGANISMS SEEN    Culture NO GROWTH 3 DAYS  Final   Report Status 02/11/2016 FINAL  Final  Culture, blood (Routine X 2) w Reflex to ID Panel     Status: None (Preliminary result)   Collection Time: 02/07/16  2:50 PM  Result Value Ref Range Status   Specimen Description BLOOD RIGHT HAND  Final   Special Requests BOTTLES DRAWN AEROBIC AND ANAEROBIC 5CC  Final   Culture NO GROWTH 4 DAYS  Final   Report Status PENDING  Incomplete  Culture, blood (Routine X 2) w Reflex to ID Panel     Status:  None (Preliminary result)   Collection Time: 02/07/16  3:00 PM  Result Value Ref Range Status   Specimen Description LEFT ANTECUBITAL  Final   Special Requests IN PEDIATRIC BOTTLE 3CC  Final   Culture NO GROWTH 4 DAYS  Final   Report Status PENDING  Incomplete    Impression/Plan:  1. seziures - does not appear consistent with infection and CSF cultures negative.  I will stop ceftriaxone. Though HSV PCR not back yet he is doing well and c/w PRES and agree with neurology that acyclovir can be discontinued 2. Abscess - e coli.  Drains out.  Can use keflex 500 mg tid for about 5 more days.  I will sign off, please call with any questions. thanks
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
## 2016-02-12 ENCOUNTER — Other Ambulatory Visit: Payer: Self-pay | Admitting: Surgery

## 2016-02-12 DIAGNOSIS — K651 Peritoneal abscess: Principal | ICD-10-CM

## 2016-02-12 DIAGNOSIS — T814XXD Infection following a procedure, subsequent encounter: Principal | ICD-10-CM

## 2016-02-12 DIAGNOSIS — IMO0001 Reserved for inherently not codable concepts without codable children: Secondary | ICD-10-CM

## 2016-02-12 LAB — CULTURE, BLOOD (ROUTINE X 2)
Culture: NO GROWTH
Culture: NO GROWTH

## 2016-02-12 LAB — TYPE AND SCREEN
ABO/RH(D): AB POS
Antibody Screen: POSITIVE
DAT, IgG: NEGATIVE
Donor AG Type: NEGATIVE
Donor AG Type: NEGATIVE
Unit division: 0
Unit division: 0

## 2016-02-12 MED ORDER — OXYCODONE-ACETAMINOPHEN 7.5-325 MG PO TABS: 1.0000 | ORAL_TABLET | ORAL | 0 refills | Status: DC | PRN

## 2016-02-12 MED ORDER — LEVETIRACETAM 500 MG PO TABS: 500.0000 mg | ORAL_TABLET | Freq: Two times a day (BID) | ORAL | 0 refills | Status: DC

## 2016-02-12 MED ORDER — CEPHALEXIN 500 MG PO CAPS: 500.0000 mg | ORAL_CAPSULE | Freq: Three times a day (TID) | ORAL | 0 refills | Status: DC

## 2016-02-12 MED ORDER — RIVAROXABAN 15 MG PO TABS: 15.0000 mg | ORAL_TABLET | Freq: Two times a day (BID) | ORAL | 0 refills | Status: DC

## 2016-02-12 MED ORDER — METOPROLOL TARTRATE 50 MG PO TABS: 50.0000 mg | ORAL_TABLET | Freq: Two times a day (BID) | ORAL | 0 refills | Status: DC

## 2016-02-12 MED FILL — OXYCODONE-APAP 7.5-325 MG: 7.5-325 | 8 days supply | Qty: 60 | Fill #0

## 2016-02-12 MED FILL — METOPROLOL TARTRATE 50 MG T: 50 | 34 days supply | Qty: 68 | Fill #0

## 2016-02-12 MED FILL — CEPHALEXIN 500 MG CAPSULE: 500 | 5 days supply | Qty: 15 | Fill #0
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
## 2016-02-12 NOTE — Progress Notes (Signed)
Noted plans for d/c home. Pt is self limiting with therapy and needs max encouragement for any and all mobility. He is not appropriate for admission to inpt rehab. Recommend Home with [facility] and family support as he wishes. [phone]
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
## 2016-02-12 NOTE — Care Management Note (Addendum)
Case Management Note  Patient Details  Name: Samuel Owens MRN: [id number] Date of Birth: 1996-03-12  Subjective/Objective:  Pt for dc home today with girlfriend.  He will be able to have [facility] services follow up with him, as he is staying in [place].  Pt is uninsured; needs assistance with medications.                    Action/Plan: Referral to [facility] for [facility] follow up at discharge.  Home care agency has discharging address/phone #. Pt requests crutches for home, but RW recommended by therapies.  He is agreeable to RW for home use.  Referral to [facility] for DME needs; RW to be delivered to pt prior to dc.  Pt is eligible for medication assistance through [facility] program.  [facility] letter given with explanation of program benefits.   Pt will need follow up of DVT with PCP.  Hospital follow up appt made for pt at [facility].  Appt is August 21 at 8:15, and appt information put on AVS in EPIC.  Pt given Xarelto 30 day free trial card for assistance with Xarelto.      Expected Discharge Date:    02/12/16              Expected Discharge Plan:  Home w Home Health Services  In-House Referral:  Clinical Social Work  Discharge planning Services  CM Consult, MATCH program, [facility]; Follow up appointment scheduled; Medication assistance  Post Acute Care Choice:  Durable Medical Equipment, Home Health Choice offered to:  Patient  DME Arranged:  [place] [facility] DME Agency:  [facility]  HH Arranged:  RN [facility] Agency:  [facility]  Status of Service:  Completed, signed off  If discussed at Long Length of Stay Meetings, dates discussed:    Additional Comments:  Quintella Baton, RN, BSN  Trauma/Neuro ICU Case Manager [phone]
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
## 2016-02-12 NOTE — Progress Notes (Signed)
Pt discharged to home.  Discharge instructions explained to pt.  Pt discharged with Foley catheter, JP drain, and rt nephrostomy tube.  Pt shown how to switch to leg bag from foley bag.  Pt states he knows how to empty drains.  Pt states he has all belongings.  IV removed.  Pt taken off unit via wheelchair by staff.

## 2016-02-13 ENCOUNTER — Encounter (HOSPITAL_COMMUNITY): Payer: Self-pay | Admitting: Emergency Medicine

## 2016-02-20 ENCOUNTER — Encounter (HOSPITAL_COMMUNITY): Payer: Self-pay

## 2016-02-20 ENCOUNTER — Emergency Department (HOSPITAL_COMMUNITY)
Admission: EM | Admit: 2016-02-20 | Discharge: 2016-02-20 | Disposition: A | Discharge status: Home / Self Care | Payer: Medicaid Other | Attending: Emergency Medicine | Admitting: Emergency Medicine

## 2016-02-20 DIAGNOSIS — Z79899 Other long term (current) drug therapy: Secondary | ICD-10-CM | POA: Diagnosis not present

## 2016-02-20 DIAGNOSIS — R32 Unspecified urinary incontinence: Secondary | ICD-10-CM | POA: Diagnosis present

## 2016-02-20 DIAGNOSIS — T83098A Other mechanical complication of other indwelling urethral catheter, initial encounter: Secondary | ICD-10-CM | POA: Insufficient documentation

## 2016-02-20 DIAGNOSIS — Z7901 Long term (current) use of anticoagulants: Secondary | ICD-10-CM | POA: Diagnosis not present

## 2016-02-20 DIAGNOSIS — T839XXA Unspecified complication of genitourinary prosthetic device, implant and graft, initial encounter: Secondary | ICD-10-CM

## 2016-02-20 DIAGNOSIS — Y731 Therapeutic (nonsurgical) and rehabilitative gastroenterology and urology devices associated with adverse incidents: Secondary | ICD-10-CM | POA: Insufficient documentation

## 2016-02-20 DIAGNOSIS — Z87891 Personal history of nicotine dependence: Secondary | ICD-10-CM | POA: Diagnosis not present

## 2016-02-20 LAB — CBC WITH DIFFERENTIAL/PLATELET
Basophils Absolute: 0 10*3/uL (ref 0.0–0.1)
Basophils Relative: 0 %
Eosinophils Absolute: 0.3 10*3/uL (ref 0.0–0.7)
Eosinophils Relative: 2 %
HCT: 29.2 % — ABNORMAL LOW (ref 39.0–52.0)
Hemoglobin: 8.9 g/dL — ABNORMAL LOW (ref 13.0–17.0)
Lymphocytes Relative: 16 %
Lymphs Abs: 1.8 10*3/uL (ref 0.7–4.0)
MCH: 27.6 pg (ref 26.0–34.0)
MCHC: 30.5 g/dL (ref 30.0–36.0)
MCV: 90.7 fL (ref 78.0–100.0)
Monocytes Absolute: 0.8 10*3/uL (ref 0.1–1.0)
Monocytes Relative: 8 %
Neutro Abs: 8.2 10*3/uL — ABNORMAL HIGH (ref 1.7–7.7)
Neutrophils Relative %: 74 %
Platelets: 359 10*3/uL (ref 150–400)
RBC: 3.22 MIL/uL — ABNORMAL LOW (ref 4.22–5.81)
RDW: 17.1 % — ABNORMAL HIGH (ref 11.5–15.5)
WBC: 11.1 10*3/uL — ABNORMAL HIGH (ref 4.0–10.5)

## 2016-02-20 LAB — BASIC METABOLIC PANEL
Anion gap: 8 (ref 5–15)
BUN: 7 mg/dL (ref 6–20)
CO2: 25 mmol/L (ref 22–32)
Calcium: 8.5 mg/dL — ABNORMAL LOW (ref 8.9–10.3)
Chloride: 105 mmol/L (ref 101–111)
Creatinine, Ser: 0.98 mg/dL (ref 0.61–1.24)
GFR calc Af Amer: >60 mL/min (ref >60)
GFR calc non Af Amer: >60 mL/min (ref >60)
Glucose, Bld: 100 mg/dL — ABNORMAL HIGH (ref 65–99)
Potassium: 3.4 mmol/L — ABNORMAL LOW (ref 3.5–5.1)
Sodium: 138 mmol/L (ref 135–145)

## 2016-02-20 LAB — URINALYSIS, ROUTINE W REFLEX MICROSCOPIC
Bilirubin Urine: NEGATIVE
Bilirubin Urine: NEGATIVE
Glucose, UA: NEGATIVE mg/dL
Glucose, UA: NEGATIVE mg/dL
Ketones, ur: NEGATIVE mg/dL
Ketones, ur: NEGATIVE mg/dL
Nitrite: NEGATIVE
Nitrite: NEGATIVE
Protein, ur: 30 mg/dL — AB
Protein, ur: 30 mg/dL — AB
Specific Gravity, Urine: 1.012 (ref 1.005–1.030)
Specific Gravity, Urine: 1.011 (ref 1.005–1.030)
pH: 6 (ref 5.0–8.0)
pH: 6.5 (ref 5.0–8.0)

## 2016-02-20 LAB — URINE MICROSCOPIC-ADD ON: Squamous Epithelial / LPF: NONE SEEN
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
## 2016-02-20 NOTE — ED Notes (Signed)
Pt. Ambulated independently back to room from waiting room.

## 2016-02-20 NOTE — ED Notes (Signed)
Balloon deflated and foley catheter removed, pt tolerated well.
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
## 2016-02-21 ENCOUNTER — Ambulatory Visit
Admission: RE | Admit: 2016-02-21 | Discharge: 2016-02-21 | Disposition: A | Discharge status: Home / Self Care | Payer: Self-pay | Source: Ambulatory Visit | Attending: Surgery | Admitting: Surgery

## 2016-02-21 ENCOUNTER — Other Ambulatory Visit: Payer: Self-pay | Admitting: Physician Assistant

## 2016-02-21 ENCOUNTER — Ambulatory Visit
Admission: RE | Admit: 2016-02-21 | Discharge: 2016-02-21 | Disposition: A | Discharge status: Home / Self Care | Payer: Self-pay | Source: Ambulatory Visit | Attending: General Surgery | Admitting: General Surgery

## 2016-02-21 ENCOUNTER — Other Ambulatory Visit (HOSPITAL_COMMUNITY): Payer: Self-pay | Admitting: Interventional Radiology

## 2016-02-21 DIAGNOSIS — R188 Other ascites: Secondary | ICD-10-CM

## 2016-02-21 DIAGNOSIS — K651 Peritoneal abscess: Principal | ICD-10-CM

## 2016-02-21 DIAGNOSIS — IMO0001 Reserved for inherently not codable concepts without codable children: Secondary | ICD-10-CM

## 2016-02-21 DIAGNOSIS — T814XXD Infection following a procedure, subsequent encounter: Principal | ICD-10-CM

## 2016-02-21 DIAGNOSIS — W3400XA Accidental discharge from unspecified firearms or gun, initial encounter: Secondary | ICD-10-CM

## 2016-02-21 HISTORY — PX: IR GENERIC HISTORICAL: IMG1180011

## 2016-02-21 LAB — URINE CULTURE: Culture: 40000 — AB

## 2016-02-21 MED ORDER — IOPAMIDOL (ISOVUE-300) INJECTION 61%
100.0000 mL | Freq: Once | INTRAVENOUS | Status: AC | PRN
  Administered 2016-02-21: 100 mL via INTRAVENOUS
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
## 2016-02-22 ENCOUNTER — Inpatient Hospital Stay (HOSPITAL_COMMUNITY): Payer: Medicaid Other

## 2016-02-22 ENCOUNTER — Telehealth (HOSPITAL_BASED_OUTPATIENT_CLINIC_OR_DEPARTMENT_OTHER): Payer: Self-pay

## 2016-02-22 ENCOUNTER — Encounter (HOSPITAL_COMMUNITY): Payer: Self-pay

## 2016-02-22 ENCOUNTER — Inpatient Hospital Stay (HOSPITAL_COMMUNITY)
Admission: EM | Admit: 2016-02-22 | Discharge: 2016-03-04 | DRG: 199 | Disposition: A | Discharge status: Home Health | Payer: Medicaid Other | Attending: General Surgery | Admitting: General Surgery

## 2016-02-22 ENCOUNTER — Ambulatory Visit (HOSPITAL_COMMUNITY)
Admission: RE | Admit: 2016-02-22 | Discharge: 2016-02-22 | Disposition: A | Discharge status: Home / Self Care | Payer: Medicaid Other | Source: Ambulatory Visit | Attending: Interventional Radiology | Admitting: Interventional Radiology

## 2016-02-22 DIAGNOSIS — W3400XA Accidental discharge from unspecified firearms or gun, initial encounter: Secondary | ICD-10-CM

## 2016-02-22 DIAGNOSIS — J939 Pneumothorax, unspecified: Secondary | ICD-10-CM

## 2016-02-22 DIAGNOSIS — R531 Weakness: Secondary | ICD-10-CM | POA: Insufficient documentation

## 2016-02-22 DIAGNOSIS — S270XXA Traumatic pneumothorax, initial encounter: Principal | ICD-10-CM

## 2016-02-22 DIAGNOSIS — IMO0001 Reserved for inherently not codable concepts without codable children: Secondary | ICD-10-CM

## 2016-02-22 DIAGNOSIS — R918 Other nonspecific abnormal finding of lung field: Secondary | ICD-10-CM | POA: Insufficient documentation

## 2016-02-22 DIAGNOSIS — J930 Spontaneous tension pneumothorax: Secondary | ICD-10-CM

## 2016-02-22 DIAGNOSIS — Z9689 Presence of other specified functional implants: Secondary | ICD-10-CM

## 2016-02-22 DIAGNOSIS — Z7901 Long term (current) use of anticoagulants: Secondary | ICD-10-CM | POA: Diagnosis not present

## 2016-02-22 DIAGNOSIS — K750 Abscess of liver: Secondary | ICD-10-CM | POA: Diagnosis present

## 2016-02-22 DIAGNOSIS — Z87891 Personal history of nicotine dependence: Secondary | ICD-10-CM | POA: Diagnosis not present

## 2016-02-22 DIAGNOSIS — S272XXA Traumatic hemopneumothorax, initial encounter: Secondary | ICD-10-CM

## 2016-02-22 DIAGNOSIS — R0602 Shortness of breath: Secondary | ICD-10-CM | POA: Insufficient documentation

## 2016-02-22 DIAGNOSIS — T814XXD Infection following a procedure, subsequent encounter: Secondary | ICD-10-CM

## 2016-02-22 DIAGNOSIS — T797XXA Traumatic subcutaneous emphysema, initial encounter: Secondary | ICD-10-CM | POA: Diagnosis present

## 2016-02-22 DIAGNOSIS — J9311 Primary spontaneous pneumothorax: Secondary | ICD-10-CM | POA: Diagnosis not present

## 2016-02-22 DIAGNOSIS — K651 Peritoneal abscess: Secondary | ICD-10-CM

## 2016-02-22 DIAGNOSIS — S270XXS Traumatic pneumothorax, sequela: Secondary | ICD-10-CM

## 2016-02-22 HISTORY — DX: Pneumothorax, unspecified: J93.9

## 2016-02-22 HISTORY — DX: Accidental discharge from unspecified firearms or gun, initial encounter: W34.00XA

## 2016-02-22 HISTORY — PX: CHEST TUBE INSERTION: SHX231

## 2016-02-22 LAB — CBC WITH DIFFERENTIAL/PLATELET
Basophils Absolute: 0 10*3/uL (ref 0.0–0.1)
Basophils Relative: 0 %
Eosinophils Absolute: 0.3 10*3/uL (ref 0.0–0.7)
Eosinophils Relative: 3 %
HCT: 28.7 % — ABNORMAL LOW (ref 39.0–52.0)
Hemoglobin: 8.7 g/dL — ABNORMAL LOW (ref 13.0–17.0)
Lymphocytes Relative: 21 %
Lymphs Abs: 1.8 10*3/uL (ref 0.7–4.0)
MCH: 27.6 pg (ref 26.0–34.0)
MCHC: 30.3 g/dL (ref 30.0–36.0)
MCV: 91.1 fL (ref 78.0–100.0)
Monocytes Absolute: 0.8 10*3/uL (ref 0.1–1.0)
Monocytes Relative: 9 %
Neutro Abs: 5.6 10*3/uL (ref 1.7–7.7)
Neutrophils Relative %: 67 %
Platelets: 329 10*3/uL (ref 150–400)
RBC: 3.15 MIL/uL — ABNORMAL LOW (ref 4.22–5.81)
RDW: 17.2 % — ABNORMAL HIGH (ref 11.5–15.5)
WBC: 8.4 10*3/uL (ref 4.0–10.5)

## 2016-02-22 LAB — I-STAT CHEM 8, ED
BUN: 6 mg/dL (ref 6–20)
Calcium, Ion: 1.15 mmol/L (ref 1.13–1.30)
Chloride: 102 mmol/L (ref 101–111)
Creatinine, Ser: 1 mg/dL (ref 0.61–1.24)
Glucose, Bld: 93 mg/dL (ref 65–99)
HCT: 28 % — ABNORMAL LOW (ref 39.0–52.0)
Hemoglobin: 9.5 g/dL — ABNORMAL LOW (ref 13.0–17.0)
Potassium: 3.5 mmol/L (ref 3.5–5.1)
Sodium: 141 mmol/L (ref 135–145)
TCO2: 26 mmol/L (ref 0–100)

## 2016-02-22 MED ORDER — LEVETIRACETAM 500 MG PO TABS
500.0000 mg | ORAL_TABLET | Freq: Two times a day (BID) | ORAL | Status: DC
  Administered 2016-02-22 – 2016-03-04 (×22): 500 mg via ORAL
  Filled 2016-02-22 (×22): qty 1

## 2016-02-22 MED ORDER — CEPHALEXIN 500 MG PO CAPS: 500.0000 mg | ORAL_CAPSULE | Freq: Three times a day (TID) | ORAL | Status: DC

## 2016-02-22 MED ORDER — RIVAROXABAN 15 MG PO TABS
15.0000 mg | ORAL_TABLET | Freq: Two times a day (BID) | ORAL | Status: AC
  Administered 2016-02-22 – 2016-03-02 (×19): 15 mg via ORAL
  Filled 2016-02-22 (×19): qty 1

## 2016-02-22 MED ORDER — ONDANSETRON HCL 4 MG PO TABS: 4.0000 mg | ORAL_TABLET | Freq: Four times a day (QID) | ORAL | Status: DC | PRN

## 2016-02-22 MED ORDER — ONDANSETRON HCL 4 MG/2ML IJ SOLN: 4.0000 mg | Freq: Four times a day (QID) | INTRAMUSCULAR | Status: DC | PRN

## 2016-02-22 MED ORDER — METOPROLOL TARTRATE 50 MG PO TABS
50.0000 mg | ORAL_TABLET | Freq: Two times a day (BID) | ORAL | Status: DC
  Administered 2016-02-22 – 2016-03-04 (×23): 50 mg via ORAL
  Filled 2016-02-22 (×23): qty 1

## 2016-02-22 MED ORDER — FENTANYL CITRATE (PF) 100 MCG/2ML IJ SOLN
100.0000 ug | Freq: Once | INTRAMUSCULAR | Status: DC
  Filled 2016-02-22: qty 2

## 2016-02-22 MED ORDER — MIDAZOLAM HCL 2 MG/2ML IJ SOLN
INTRAMUSCULAR | Status: AC
  Filled 2016-02-22: qty 6

## 2016-02-22 MED ORDER — OXYCODONE-ACETAMINOPHEN 7.5-325 MG PO TABS
1.0000 | ORAL_TABLET | ORAL | Status: DC | PRN
  Administered 2016-02-22 – 2016-03-02 (×21): 2 via ORAL
  Administered 2016-03-03: 1 via ORAL
  Administered 2016-03-03 (×2): 2 via ORAL
  Filled 2016-02-22 (×13): qty 2
  Filled 2016-02-22: qty 1
  Filled 2016-02-22 (×12): qty 2

## 2016-02-22 MED ORDER — FENTANYL CITRATE (PF) 100 MCG/2ML IJ SOLN
INTRAMUSCULAR | Status: AC
  Filled 2016-02-22: qty 2

## 2016-02-22 MED ORDER — RIVAROXABAN 20 MG PO TABS
20.0000 mg | ORAL_TABLET | Freq: Every day | ORAL | Status: DC
  Administered 2016-03-03: 20 mg via ORAL
  Filled 2016-02-22: qty 1

## 2016-02-22 MED ORDER — FENTANYL CITRATE (PF) 100 MCG/2ML IJ SOLN
INTRAMUSCULAR | Status: AC | PRN
  Administered 2016-02-22: 100 ug via INTRAVENOUS

## 2016-02-22 MED ORDER — IBUPROFEN 400 MG PO TABS: 400.0000 mg | ORAL_TABLET | Freq: Four times a day (QID) | ORAL | Status: DC | PRN

## 2016-02-22 MED ORDER — FENTANYL CITRATE (PF) 100 MCG/2ML IJ SOLN
25.0000 ug | INTRAMUSCULAR | Status: DC | PRN
  Administered 2016-02-26: 50 ug via INTRAVENOUS
  Filled 2016-02-22: qty 2

## 2016-02-22 MED ORDER — MIDAZOLAM HCL 2 MG/2ML IJ SOLN
INTRAMUSCULAR | Status: AC | PRN
  Administered 2016-02-22 (×2): 2 mg via INTRAVENOUS

## 2016-02-22 MED ORDER — MIDAZOLAM HCL 2 MG/2ML IJ SOLN: 5.0000 mg | Freq: Once | INTRAMUSCULAR | Status: DC

## 2016-02-22 MED ORDER — SODIUM CHLORIDE 0.9 % IV SOLN: INTRAVENOUS | Status: DC

## 2016-02-22 MED ORDER — LIDOCAINE-EPINEPHRINE 1 %-1:100000 IJ SOLN
30.0000 mL | Freq: Once | INTRAMUSCULAR | Status: AC
  Administered 2016-02-22: 30 mL via INTRADERMAL
  Filled 2016-02-22: qty 1

## 2016-02-22 MED ORDER — ACETAMINOPHEN 325 MG PO TABS: 650.0000 mg | ORAL_TABLET | Freq: Four times a day (QID) | ORAL | Status: DC | PRN

## 2016-02-22 NOTE — Telephone Encounter (Signed)
Post ED Visit - Positive Culture Follow-up  Culture report reviewed by antimicrobial stewardship pharmacist:  []  Enzo Bi, Pharm.D. []  Celedonio Miyamoto, Pharm.D., BCPS []  Garvin Fila, Pharm.D. []  Georgina Pillion, Pharm.D., BCPS []  [place], [address].D., BCPS, AAHIVP []  Estella Husk, Pharm.D., BCPS, AAHIVP []  Tennis Must, Pharm.D. []  Rob W[place]ell [place], [address].D. Gwyndolyn Kaufman Pharm D Positive urine culture and no further patient follow-up is required at this time.  Jerry Caras 02/22/2016, 12:09 PM

## 2016-02-22 NOTE — Progress Notes (Signed)
Progress Note.  Patient presents for drain injection/exchange.    CXR today shows tension pneumothorax.    Advised patient that we are sending to the ED for evaluation.  We will defer drain injection and exchange.   Signed,  Yvone Neu. Loreta Ave, DO
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
## 2016-02-22 NOTE — Procedures (Signed)
Chest Tube Insertion Procedure Note  Pre-operative Diagnosis: Right Pneumothorax  Post-operative Diagnosis: Right Pneumothorax  Procedure Details  Informed consent was obtained for the procedure, including sedation.  Risks of lung perforation, hemorrhage, arrhythmia, and adverse drug reaction were discussed.   After sterile skin prep, using standard technique, a 20 French tube was placed in the right midaxillary line just above nipple level.  Findings: Large rush of air  Estimated Blood Loss:  less than 50 mL         Specimens:  None              Complications:  None; patient tolerated the procedure well.         Disposition: trauma bay awaiting floor         Condition: stable Samuel Gelinas, MD, MPH, FACS Trauma: [phone] General Surgery: [phone]

## 2016-02-22 NOTE — ED Notes (Signed)
Attempted report 

## 2016-02-22 NOTE — Progress Notes (Signed)
PHARMACY NOTE  Spoke with patient to clarify some of his home medications.  He tells me he has not taken Xarelto since he was discharged on 7/25. He reports that the prescription was not given to him. He was supposed to take 15 mg bid through 8/13 then start 20 mg daily.  He also does not recall taking Keppra since discharge. I also questioned him about Keflex - he reports only taking it bid instead of tid as prescribed. Spoke with Prince Rome, PA and he plans to culture drain output and will start antibiotics if needed.  Loura Back, PharmD, BCPS Clinical Pharmacist Main pharmacy [phone] 02/22/2016 3:51 PM
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
## 2016-02-23 ENCOUNTER — Inpatient Hospital Stay (HOSPITAL_COMMUNITY): Payer: Medicaid Other

## 2016-02-23 LAB — CBC
HCT: 32.4 % — ABNORMAL LOW (ref 39.0–52.0)
Hemoglobin: 9.8 g/dL — ABNORMAL LOW (ref 13.0–17.0)
MCH: 28.2 pg (ref 26.0–34.0)
MCHC: 30.2 g/dL (ref 30.0–36.0)
MCV: 93.1 fL (ref 78.0–100.0)
Platelets: 343 10*3/uL (ref 150–400)
RBC: 3.48 MIL/uL — ABNORMAL LOW (ref 4.22–5.81)
RDW: 17.3 % — ABNORMAL HIGH (ref 11.5–15.5)
WBC: 11.1 10*3/uL — ABNORMAL HIGH (ref 4.0–10.5)
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
## 2016-02-24 ENCOUNTER — Inpatient Hospital Stay (HOSPITAL_COMMUNITY): Payer: Medicaid Other

## 2016-02-24 MED ORDER — SULFAMETHOXAZOLE-TRIMETHOPRIM 800-160 MG PO TABS
2.0000 | ORAL_TABLET | Freq: Two times a day (BID) | ORAL | Status: DC
  Administered 2016-02-24 – 2016-03-04 (×18): 2 via ORAL
  Filled 2016-02-24 (×19): qty 2
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
## 2016-02-24 NOTE — Progress Notes (Signed)
Pharmacy Antibiotic Note  Samuel Owens is a 20 y.o. male admitted on 02/22/2016 with stenotrophomonas in hepatic abscess. Pharmacy has been consulted for Bactrim dosing.  Day #1 of Bactrim for hepatic abscess. SCr stable, CrCl ~13400ml/min  Plan: Start Bactrim 2 DS tablets PO BID  Monitor clinical picture, renal function, electrolytes F/U  LOT (Consider 7-10 days)   Height: 5\' 9"  (175.3 cm) Weight: 145 lb (65.8 kg) IBW/kg (Calculated) : 70.7  Temp (24hrs), Avg:98.2 F (36.8 C), Min:98.1 F (36.7 C), Max:98.2 F (36.8 C)   Recent Labs Lab 02/20/16 1037 02/22/16 1215 02/22/16 1313 02/23/16 0326  WBC 11.1* 8.4  --  11.1*  CREATININE 0.98  --  1.00  --     Estimated Creatinine Clearance: 107.8 mL/min (by C-G formula based on SCr of 1 mg/dL).    No Known Allergies  Antimicrobials this admission: Bactrim 8/6 >>   Dose adjustments this admission: n/a  Microbiology results: 8/6 Hepatic abscess > Stenotrophomonas  Thank you for allowing pharmacy to be a part of this patient's care.  Armandina StammerBATCHELDER,Marshea Wisher J 02/24/2016 3:21 PM

## 2016-02-25 ENCOUNTER — Inpatient Hospital Stay (HOSPITAL_COMMUNITY): Payer: Medicaid Other

## 2016-02-25 ENCOUNTER — Encounter (HOSPITAL_COMMUNITY): Payer: Self-pay | Admitting: Radiology

## 2016-02-25 LAB — GLUCOSE, CAPILLARY
Glucose-Capillary: 109 mg/dL — ABNORMAL HIGH (ref 65–99)
Glucose-Capillary: 99 mg/dL (ref 65–99)
Glucose-Capillary: 86 mg/dL (ref 65–99)
Glucose-Capillary: 108 mg/dL — ABNORMAL HIGH (ref 65–99)
Glucose-Capillary: 94 mg/dL (ref 65–99)

## 2016-02-25 MED ORDER — IOPAMIDOL (ISOVUE-300) INJECTION 61%
INTRAVENOUS | Status: AC
  Administered 2016-02-25: 75 mL
  Filled 2016-02-25: qty 75

## 2016-02-25 NOTE — Progress Notes (Signed)
Minimal output noted from chest tube

## 2016-02-25 NOTE — Progress Notes (Signed)
Chest tube suction increased to 40cm by MD
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
## 2016-02-26 ENCOUNTER — Inpatient Hospital Stay (HOSPITAL_COMMUNITY): Payer: Medicaid Other

## 2016-02-26 LAB — BASIC METABOLIC PANEL
Anion gap: 7 (ref 5–15)
BUN: 9 mg/dL (ref 6–20)
CO2: 27 mmol/L (ref 22–32)
Calcium: 8.7 mg/dL — ABNORMAL LOW (ref 8.9–10.3)
Chloride: 100 mmol/L — ABNORMAL LOW (ref 101–111)
Creatinine, Ser: 1.16 mg/dL (ref 0.61–1.24)
GFR calc Af Amer: >60 mL/min (ref >60)
GFR calc non Af Amer: >60 mL/min (ref >60)
Glucose, Bld: 86 mg/dL (ref 65–99)
Potassium: 3.7 mmol/L (ref 3.5–5.1)
Sodium: 134 mmol/L — ABNORMAL LOW (ref 135–145)

## 2016-02-26 LAB — CBC
HCT: 30.1 % — ABNORMAL LOW (ref 39.0–52.0)
Hemoglobin: 9.2 g/dL — ABNORMAL LOW (ref 13.0–17.0)
MCH: 27.1 pg (ref 26.0–34.0)
MCHC: 30.6 g/dL (ref 30.0–36.0)
MCV: 88.8 fL (ref 78.0–100.0)
Platelets: 380 10*3/uL (ref 150–400)
RBC: 3.39 MIL/uL — ABNORMAL LOW (ref 4.22–5.81)
RDW: 16.1 % — ABNORMAL HIGH (ref 11.5–15.5)
WBC: 8 10*3/uL (ref 4.0–10.5)

## 2016-02-26 MED ORDER — HYDROMORPHONE HCL 1 MG/ML IJ SOLN: 0.5000 mg | INTRAMUSCULAR | Status: DC | PRN

## 2016-02-26 MED ORDER — LIDOCAINE-EPINEPHRINE 1 %-1:100000 IJ SOLN
20.0000 mL | Freq: Once | INTRAMUSCULAR | Status: DC
  Filled 2016-02-26: qty 20
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
## 2016-02-27 ENCOUNTER — Inpatient Hospital Stay (HOSPITAL_COMMUNITY): Payer: Medicaid Other

## 2016-02-27 LAB — AEROBIC/ANAEROBIC CULTURE W GRAM STAIN (SURGICAL/DEEP WOUND)

## 2016-02-27 LAB — AEROBIC/ANAEROBIC CULTURE (SURGICAL/DEEP WOUND)

## 2016-02-27 NOTE — Discharge Instructions (Addendum)
Information on my medicine - XARELTO (rivaroxaban)  This medication education was reviewed with me or my healthcare representative as part of my discharge preparation.  The pharmacist that spoke with me during my hospital stay was:  Lennon AlstromDang, Thuy Dien, [facility]PH  WHY WAS Carlena HurlXARELTO PRESCRIBED FOR YOU? Xarelto was prescribed to treat blood clots that may have been found in the veins of your legs (deep vein thrombosis) or in your lungs (pulmonary embolism) and to reduce the risk of them occurring again.  What do you need to know about Xarelto? The starting dose is one 15 mg tablet taken TWICE daily with food for the FIRST 21 DAYS then on (enter date)  03/03/16  the dose is changed to one 20 mg tablet taken ONCE A DAY with your evening meal.  DO NOT stop taking Xarelto without talking to the health care provider who prescribed the medication.  Refill your prescription for 20 mg tablets before you run out.  After discharge, you should have regular check-up appointments with your healthcare provider that is prescribing your Xarelto.  In the future your dose may need to be changed if your kidney function changes by a significant amount.  What do you do if you miss a dose? If you are taking Xarelto TWICE DAILY and you miss a dose, take it as soon as you remember. You may take two 15 mg tablets (total 30 mg) at the same time then resume your regularly scheduled 15 mg twice daily the next day.  If you are taking Xarelto ONCE DAILY and you miss a dose, take it as soon as you remember on the same day then continue your regularly scheduled once daily regimen the next day. Do not take two doses of Xarelto at the same time.   Important Safety Information Xarelto is a blood thinner medicine that can cause bleeding. You should call your healthcare provider right away if you experience any of the following: ? Bleeding from an injury or your nose that does not stop. ? Unusual colored urine (red or dark brown) or  unusual colored stools (red or black). ? Unusual bruising for unknown reasons. ? A serious fall or if you hit your head (even if there is no bleeding).  Some medicines may interact with Xarelto and might increase your risk of bleeding while on Xarelto. To help avoid this, consult your healthcare provider or pharmacist prior to using any new prescription or non-prescription medications, including herbals, vitamins, non-steroidal anti-inflammatory drugs (NSAIDs) and supplements.  This website has more information on Xarelto: [email or web address].[email or web address].xarelto.com.   Remove chest dressing on Wednesday then Wash wounds daily in shower with soap and water. Do not soak. Apply antibiotic ointment (e.g. Neosporin) twice daily and as needed to keep moist. Cover with dry dressing.  No driving while taking oxycodone.
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
## 2016-02-28 ENCOUNTER — Inpatient Hospital Stay (HOSPITAL_COMMUNITY): Payer: Medicaid Other
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
## 2016-02-29 ENCOUNTER — Inpatient Hospital Stay (HOSPITAL_COMMUNITY): Payer: Medicaid Other

## 2016-02-29 DIAGNOSIS — J9311 Primary spontaneous pneumothorax: Secondary | ICD-10-CM
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
## 2016-02-29 NOTE — Progress Notes (Signed)
Patient has no iv access. Request for me to remove it yesterday and wants to leave it out unless another is needed later.
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
## 2016-03-01 ENCOUNTER — Inpatient Hospital Stay (HOSPITAL_COMMUNITY): Payer: Medicaid Other

## 2016-03-01 NOTE — Progress Notes (Addendum)
      [address]       [place] [id number]             [phone]      Subjective:  No new complaints.  Asking if he can walk.   Objective: Vital signs in last 24 hours: Temp:  [98 F (36.7 C)-99.3 F (37.4 C)] 99.3 F (37.4 C) (08/12 0505) Pulse Rate:  [58-82] 74 (08/12 0505) Resp:  [16-18] 16 (08/12 0505) BP: (110-121)/(62-84) 118/74 (08/12 0505) SpO2:  [100 %] 100 % (08/12 0505)  Intake/Output from previous day: 08/11 0701 - 08/12 0700 In: 620 [P.O.:600] Out: 1695 [Urine:1695] Intake/Output this shift: Total I/O In: -  Out: 100 [Urine:100]  General appearance: alert, cooperative and no distress Heart: regular rate and rhythm Lungs: clear to auscultation bilaterally Wound: clean and dry  Lab Results: No results for input(s): WBC, HGB, HCT, PLT in the last 72 hours. BMET: No results for input(s): NA, K, CL, CO2, GLUCOSE, BUN, CREATININE, CALCIUM in the last 72 hours.  PT/INR: No results for input(s): LABPROT, INR in the last 72 hours. ABG    Component Value Date/Time   PHART 7.351 01/23/2016 1028   HCO3 29.5 (H) 01/23/2016 1028   TCO2 26 02/22/2016 1313   ACIDBASEDEF 1.1 01/15/2016 0340   O2SAT 96.0 01/23/2016 1028   CBG (last 3)  No results for input(s): GLUCAP in the last 72 hours.  Assessment/Plan:  1. Pneumothorax- chest tube in place, no air leak present- will place to water seal 2. Dispo- repeat CXR in AM, care per trauma   LOS: 8 days    BARRETT, ERIN 03/01/2016  I have seen and examined Samuel Owens and agree with the above assessment  and plan.  Delight OvensEdward B Zyire Eidson MD Beeper [phone] Office [phone] 03/01/2016 11:38 AM
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
## 2016-03-02 ENCOUNTER — Inpatient Hospital Stay (HOSPITAL_COMMUNITY): Payer: Medicaid Other

## 2016-03-02 NOTE — Progress Notes (Signed)
Patient refused both last night's and this mornings drain flushes. He believes the flushes cause drainage at the drain insertion site.  I explained to him that the drain might become clogged if we don't flush it and then it might leak at the site.,but he continued to refuse.
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
## 2016-03-02 NOTE — Progress Notes (Addendum)
      [address]       [place] [id number]             [phone]    Subjective:  No new complaints.  Objective: Vital signs in last 24 hours: Temp:  [98 F (36.7 C)-98.4 F (36.9 C)] 98.4 F (36.9 C) (08/13 0855) Pulse Rate:  [65-90] 90 (08/13 0855) Resp:  [16-18] 18 (08/13 0855) BP: (107-138)/(74-95) 121/82 (08/13 0855) SpO2:  [100 %] 100 % (08/13 0855)  Intake/Output from previous day: 08/12 0701 - 08/13 0700 In: 120 [P.O.:120] Out: 2150 [Urine:2000; Drains:50; Blood:100] Intake/Output this shift: Total I/O In: -  Out: 150 [Urine:150]  General appearance: alert, cooperative and no distress Heart: regular rate and rhythm Lungs: clear to auscultation bilaterally Wound: clean and dry  Lab Results: No results for input(s): WBC, HGB, HCT, PLT in the last 72 hours. BMET: No results for input(s): NA, K, CL, CO2, GLUCOSE, BUN, CREATININE, CALCIUM in the last 72 hours.  PT/INR: No results for input(s): LABPROT, INR in the last 72 hours. ABG    Component Value Date/Time   PHART 7.351 01/23/2016 1028   HCO3 29.5 (H) 01/23/2016 1028   TCO2 26 02/22/2016 1313   ACIDBASEDEF 1.1 01/15/2016 0340   O2SAT 96.0 01/23/2016 1028   CBG (last 3)  No results for input(s): GLUCAP in the last 72 hours.  Assessment/Plan:  1. Chest tube- no air leak appreciated, CXR shows stable appearance of pneumothorax 2. Dispo- leave chest tube to water seal, repeat CXR in AM, care per trauma  LOS: 9 days    Owens, Samuel 03/02/2016  Consider ct guided chest drainage if loculated ptx does not resolve, check film in am I have seen and examined Samuel Owens and agree with the above assessment  and plan.  Delight OvensEdward B Lonnetta Kniskern MD Beeper [phone] Office [phone] 03/02/2016 10:08 AM

## 2016-03-03 ENCOUNTER — Inpatient Hospital Stay (HOSPITAL_COMMUNITY): Payer: Medicaid Other

## 2016-03-03 LAB — BASIC METABOLIC PANEL
Anion gap: 8 (ref 5–15)
BUN: 19 mg/dL (ref 6–20)
CO2: 26 mmol/L (ref 22–32)
Calcium: 9.3 mg/dL (ref 8.9–10.3)
Chloride: 101 mmol/L (ref 101–111)
Creatinine, Ser: 1.42 mg/dL — ABNORMAL HIGH (ref 0.61–1.24)
GFR calc Af Amer: >60 mL/min (ref >60)
GFR calc non Af Amer: >60 mL/min (ref >60)
Glucose, Bld: 107 mg/dL — ABNORMAL HIGH (ref 65–99)
Potassium: 4.9 mmol/L (ref 3.5–5.1)
Sodium: 135 mmol/L (ref 135–145)

## 2016-03-03 NOTE — Progress Notes (Signed)
Chest xray report called to nurse. -Increase size of right pneumothorax following chest tube removal. Currently occupying approx 25 - 30% of right hemithorax.  M.Jeffery P.A notified.

## 2016-03-03 NOTE — Progress Notes (Signed)
Patient ID: Selena LesserShiquan Frenette, male   DOB: [birth year], 20 y.o.   MRN: [id number] CXR after CT removal shows loculated R PTX is a little larger but he remains asymptomatic. He refused CT guided catheter. We will observe tonight and check F/U CXR in the AM. He agrees to let us know if he develops any SOB or chest pain.  Violeta GelinasBurke Lindley Hiney, MD, MPH, FACS Trauma: [phone] General Surgery: [phone]

## 2016-03-03 NOTE — Progress Notes (Addendum)
[address]       [place] [id number]             [phone]         Subjective: Feels ok  Objective: Vital signs in last 24 hours: Temp:  [97.6 F (36.4 C)-99 F (37.2 C)] 98.4 F (36.9 C) (08/14 0420) Pulse Rate:  [72-90] 72 (08/14 0420) Resp:  [17-18] 17 (08/14 0420) BP: (113-128)/(71-87) 128/87 (08/14 0420) SpO2:  [100 %] 100 % (08/14 0420)  Hemodynamic parameters for last 24 hours:    Intake/Output from previous day: 08/13 0701 - 08/14 0700 In: 707 [P.O.:702] Out: 785 [Urine:760; Drains:25] Intake/Output this shift: No intake/output data recorded.  General appearance: alert, cooperative and no distress Heart: regular rate and rhythm Lungs: dim in right base  Lab Results: No results for input(s): WBC, HGB, HCT, PLT in the last 72 hours. BMET:  Recent Labs  03/03/16 0347  NA 135  K 4.9  CL 101  CO2 26  GLUCOSE 107*  BUN 19  CREATININE 1.42*  CALCIUM 9.3    PT/INR: No results for input(s): LABPROT, INR in the last 72 hours. ABG    Component Value Date/Time   PHART 7.351 01/23/2016 1028   HCO3 29.5 (H) 01/23/2016 1028   TCO2 26 02/22/2016 1313   ACIDBASEDEF 1.1 01/15/2016 0340   O2SAT 96.0 01/23/2016 1028   CBG (last 3)  No results for input(s): GLUCAP in the last 72 hours.  Meds Scheduled Meds: . levETIRAcetam  500 mg Oral BID  . lidocaine-EPINEPHrine  20 mL Intradermal Once  . metoprolol  50 mg Oral BID  . midazolam  5 mg Intravenous Once  . Rivaroxaban  20 mg Oral Q supper  . sulfamethoxazole-trimethoprim  2 tablet Oral Q12H   Continuous Infusions: . sodium chloride     PRN Meds:.acetaminophen, fentaNYL (SUBLIMAZE) injection, HYDROmorphone (DILAUDID) injection, ibuprofen, ondansetron **OR** ondansetron (ZOFRAN) IV, oxyCODONE-acetaminophen  Xrays Dg Chest Port 1 View  Result Date: 03/03/2016 CLINICAL DATA:  Chest tube. EXAM: PORTABLE CHEST 1 VIEW COMPARISON:  03/02/2016. FINDINGS: Right chest tube in stable  position. Stable small loculated right lower pneumothorax. Drainage catheter noted over the right upper abdomen. Mild bibasilar subsegmental atelectasis. Heart size stable. Mild right chest wall subcutaneous emphysema . IMPRESSION: Right chest tube in stable position. Stable small loculated right pneumothorax. Mild bibasilar subsegmental atelectasis . Electronically Signed   By: Maisie Fushomas  Register   On: 03/03/2016 07:15   Dg Chest Port 1 View  Result Date: 03/02/2016 CLINICAL DATA:  Evaluate pneumothorax.  Right-sided chest tube. EXAM: PORTABLE CHEST 1 VIEW COMPARISON:  03/01/2016 FINDINGS: The loculated pneumothorax on the right lower lateral hemi thorax is stable from the previous day's study. Right-sided chest tube is also stable. Small patella densities project within the partly collapsed right lower lung. Remainder of the lungs is clear. No pleural effusion. No left pneumothorax. Heart, mediastinum hila are unremarkable. IMPRESSION: 1. Stable appearance from the previous day study. Persistent loculated pneumothorax in the right lateral lower hemi thorax. No change in the position of the right chest tube. Electronically Signed   By: Amie Portlandavid  Ormond M.D.   On: 03/02/2016 07:55    Assessment/Plan:  1 CXR is stable, does not appear to have air leak currently. We have recommended placement of CT guided tube but patient is refusing at this time.     LOS: 10 days    Owens,Samuel E 03/03/2016  Chest xray after ct removal reviewed ,  not much change :   Next move is placement of ct guided  Ct , patient refused   I have seen and examined Samuel Owens and agree with the above assessment  and plan.  Delight OvensEdward B Braelynn Lupton MD Beeper [phone] Office [phone] 03/03/2016 6:12 PM
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
## 2016-03-04 ENCOUNTER — Inpatient Hospital Stay (HOSPITAL_COMMUNITY): Payer: Medicaid Other

## 2016-03-04 MED ORDER — METOPROLOL TARTRATE 50 MG PO TABS: 50.0000 mg | ORAL_TABLET | Freq: Two times a day (BID) | ORAL | 0 refills | Status: DC

## 2016-03-04 MED ORDER — LEVETIRACETAM 500 MG PO TABS: 500.0000 mg | ORAL_TABLET | Freq: Two times a day (BID) | ORAL | 0 refills | Status: DC

## 2016-03-04 MED ORDER — OXYCODONE-ACETAMINOPHEN 7.5-325 MG PO TABS: 1.0000 | ORAL_TABLET | ORAL | 0 refills | Status: DC | PRN

## 2016-03-04 MED ORDER — RIVAROXABAN 20 MG PO TABS: 20.0000 mg | ORAL_TABLET | Freq: Two times a day (BID) | ORAL | 0 refills | Status: DC

## 2016-03-04 NOTE — Care Management Note (Addendum)
Case Management Note  Patient Details  Name: Samuel LesserShiquan Coggin MRN: [id number] Date of Birth: [birth year]  Subjective/Objective:  Pt stable for discharge home today.  Needs resumption of HHRN as prior to admission.  Pt did not use free 30 day Xarelto card with last admission.  He will return to girlfriend's home on [address] upon dc, and is agreeable [facility].               Action/Plan: Notified [facility] of pt discharge and need for resumption of nursing services.  Provided pt with 30 day free Xarelto card.  Pt states he has Medicaid now, and will be able to get other meds for Medicaid copay.  He denies any additional needs at this time.    Expected Discharge Date:    03/04/2016              Expected Discharge Plan:  Home w Home Health Services  In-House Referral:     Discharge planning Services  CM Consult, Medication Assistance  Post Acute Care Choice:  Resumption of Svcs/PTA Provider, Home Health Choice offered to:  Patient  DME Arranged:    DME Agency:     HH Arranged:  RN HH Agency:  [facility]  Status of Service:  Completed, signed off  If discussed at Long Length of Stay Meetings, dates discussed:    Additional Comments:  Quintella BatonJulie W. Cyrah Mclamb, RN, BSN  Trauma/Neuro ICU Case Manager [phone]

## 2016-03-04 NOTE — Progress Notes (Signed)
Pt discharged home in stable condition after discharge education was provided with no concerns voiced. Pt instructed to follow up with the specialities stated in the AVS
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
## 2016-03-05 ENCOUNTER — Other Ambulatory Visit: Payer: Self-pay | Admitting: General Surgery

## 2016-03-05 DIAGNOSIS — IMO0001 Reserved for inherently not codable concepts without codable children: Secondary | ICD-10-CM

## 2016-03-05 DIAGNOSIS — K651 Peritoneal abscess: Principal | ICD-10-CM

## 2016-03-05 DIAGNOSIS — T814XXD Infection following a procedure, subsequent encounter: Principal | ICD-10-CM

## 2016-03-06 ENCOUNTER — Other Ambulatory Visit (INDEPENDENT_AMBULATORY_CARE_PROVIDER_SITE_OTHER): Payer: Self-pay | Admitting: Physician Assistant

## 2016-03-06 MED ORDER — RIVAROXABAN 20 MG PO TABS: 20.0000 mg | ORAL_TABLET | Freq: Two times a day (BID) | ORAL | 0 refills | Status: DC

## 2016-03-09 ENCOUNTER — Encounter (HOSPITAL_COMMUNITY): Payer: Self-pay

## 2016-03-09 ENCOUNTER — Emergency Department (HOSPITAL_COMMUNITY)
Admission: EM | Admit: 2016-03-09 | Discharge: 2016-03-09 | Disposition: A | Discharge status: Home / Self Care | Payer: Medicaid Other | Attending: Emergency Medicine | Admitting: Emergency Medicine

## 2016-03-09 DIAGNOSIS — Z5321 Procedure and treatment not carried out due to patient leaving prior to being seen by health care provider: Secondary | ICD-10-CM | POA: Insufficient documentation

## 2016-03-09 DIAGNOSIS — Z87891 Personal history of nicotine dependence: Secondary | ICD-10-CM | POA: Insufficient documentation

## 2016-03-09 DIAGNOSIS — Z4801 Encounter for change or removal of surgical wound dressing: Secondary | ICD-10-CM | POA: Diagnosis not present
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
## 2016-03-09 NOTE — ED Notes (Signed)
Called multiple times in the waiting room. No answer

## 2016-03-10 ENCOUNTER — Emergency Department (HOSPITAL_COMMUNITY)
Admission: EM | Admit: 2016-03-10 | Discharge: 2016-03-10 | Disposition: A | Discharge status: Home / Self Care | Payer: Medicaid Other | Attending: Physician Assistant | Admitting: Physician Assistant

## 2016-03-10 ENCOUNTER — Ambulatory Visit: Payer: Self-pay | Admitting: Family Medicine

## 2016-03-10 ENCOUNTER — Encounter (HOSPITAL_COMMUNITY): Payer: Self-pay

## 2016-03-10 DIAGNOSIS — Y738 Miscellaneous gastroenterology and urology devices associated with adverse incidents, not elsewhere classified: Secondary | ICD-10-CM | POA: Insufficient documentation

## 2016-03-10 DIAGNOSIS — Z79891 Long term (current) use of opiate analgesic: Secondary | ICD-10-CM | POA: Diagnosis not present

## 2016-03-10 DIAGNOSIS — T8131XA Disruption of external operation (surgical) wound, not elsewhere classified, initial encounter: Secondary | ICD-10-CM | POA: Diagnosis not present

## 2016-03-10 DIAGNOSIS — Z87891 Personal history of nicotine dependence: Secondary | ICD-10-CM | POA: Diagnosis not present

## 2016-03-10 DIAGNOSIS — Z4801 Encounter for change or removal of surgical wound dressing: Secondary | ICD-10-CM | POA: Insufficient documentation

## 2016-03-10 DIAGNOSIS — Z5189 Encounter for other specified aftercare: Secondary | ICD-10-CM

## 2016-03-10 LAB — COMPREHENSIVE METABOLIC PANEL
ALT: 54 U/L (ref 17–63)
AST: 26 U/L (ref 15–41)
Albumin: 3.2 g/dL — ABNORMAL LOW (ref 3.5–5.0)
Alkaline Phosphatase: 113 U/L (ref 38–126)
Anion gap: 5 (ref 5–15)
BUN: 13 mg/dL (ref 6–20)
CO2: 27 mmol/L (ref 22–32)
Calcium: 8.9 mg/dL (ref 8.9–10.3)
Chloride: 105 mmol/L (ref 101–111)
Creatinine, Ser: 1.1 mg/dL (ref 0.61–1.24)
GFR calc Af Amer: >60 mL/min (ref >60)
GFR calc non Af Amer: >60 mL/min (ref >60)
Glucose, Bld: 96 mg/dL (ref 65–99)
Potassium: 3.3 mmol/L — ABNORMAL LOW (ref 3.5–5.1)
Sodium: 137 mmol/L (ref 135–145)
Total Bilirubin: 0.4 mg/dL (ref 0.3–1.2)
Total Protein: 7.7 g/dL (ref 6.5–8.1)

## 2016-03-10 LAB — CBC WITH DIFFERENTIAL/PLATELET
Basophils Absolute: 0 10*3/uL (ref 0.0–0.1)
Basophils Relative: 0 %
Eosinophils Absolute: 0.2 10*3/uL (ref 0.0–0.7)
Eosinophils Relative: 2 %
HCT: 28.8 % — ABNORMAL LOW (ref 39.0–52.0)
Hemoglobin: 9.3 g/dL — ABNORMAL LOW (ref 13.0–17.0)
Lymphocytes Relative: 18 %
Lymphs Abs: 1.9 10*3/uL (ref 0.7–4.0)
MCH: 27.8 pg (ref 26.0–34.0)
MCHC: 32.3 g/dL (ref 30.0–36.0)
MCV: 86.2 fL (ref 78.0–100.0)
Monocytes Absolute: 0.7 10*3/uL (ref 0.1–1.0)
Monocytes Relative: 7 %
Neutro Abs: 7.5 10*3/uL (ref 1.7–7.7)
Neutrophils Relative %: 73 %
Platelets: 280 10*3/uL (ref 150–400)
RBC: 3.34 MIL/uL — ABNORMAL LOW (ref 4.22–5.81)
RDW: 15.6 % — ABNORMAL HIGH (ref 11.5–15.5)
WBC: 10.3 10*3/uL (ref 4.0–10.5)

## 2016-03-10 LAB — I-STAT CG4 LACTIC ACID, ED
Lactic Acid, Venous: 0.67 mmol/L (ref 0.5–1.9)
Lactic Acid, Venous: 1 mmol/L (ref 0.5–1.9)

## 2016-03-10 MED ORDER — METOPROLOL TARTRATE 50 MG PO TABS
50.0000 mg | ORAL_TABLET | Freq: Two times a day (BID) | ORAL | 0 refills | Status: DC
Start: 2016-03-10 — End: 2019-01-21

## 2016-03-10 MED ORDER — RIVAROXABAN 20 MG PO TABS: 20.0000 mg | ORAL_TABLET | Freq: Two times a day (BID) | ORAL | 0 refills | Status: DC

## 2016-03-10 MED ORDER — RIVAROXABAN 20 MG PO TABS
20.0000 mg | ORAL_TABLET | Freq: Once | ORAL | Status: AC
  Administered 2016-03-10: 20 mg via ORAL
  Filled 2016-03-10: qty 1

## 2016-03-10 MED ORDER — LEVETIRACETAM 500 MG PO TABS: 500.0000 mg | ORAL_TABLET | Freq: Two times a day (BID) | ORAL | 0 refills | Status: DC
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
## 2016-03-13 ENCOUNTER — Other Ambulatory Visit: Payer: Self-pay | Admitting: General Surgery

## 2016-03-13 ENCOUNTER — Telehealth (HOSPITAL_COMMUNITY): Payer: Self-pay | Admitting: Radiology

## 2016-03-13 ENCOUNTER — Ambulatory Visit
Admission: RE | Admit: 2016-03-13 | Discharge: 2016-03-13 | Disposition: A | Discharge status: Home / Self Care | Payer: Medicaid Other | Source: Ambulatory Visit | Attending: General Surgery | Admitting: General Surgery

## 2016-03-13 ENCOUNTER — Other Ambulatory Visit: Payer: Self-pay | Admitting: Radiology

## 2016-03-13 DIAGNOSIS — IMO0001 Reserved for inherently not codable concepts without codable children: Secondary | ICD-10-CM

## 2016-03-13 DIAGNOSIS — K651 Peritoneal abscess: Principal | ICD-10-CM

## 2016-03-13 DIAGNOSIS — T814XXD Infection following a procedure, subsequent encounter: Principal | ICD-10-CM

## 2016-03-13 DIAGNOSIS — R188 Other ascites: Secondary | ICD-10-CM

## 2016-03-13 HISTORY — PX: IR GENERIC HISTORICAL: IMG1180011

## 2016-03-13 MED ORDER — IOPAMIDOL (ISOVUE-300) INJECTION 61%
100.0000 mL | Freq: Once | INTRAVENOUS | Status: AC | PRN
  Administered 2016-03-13: 100 mL via INTRAVENOUS

## 2016-03-13 NOTE — Telephone Encounter (Signed)
Called patient with instructions for abscess drain exchange appointment for 03/14/16, [facility], NPO after 0800, have driver available.
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
## 2016-03-14 ENCOUNTER — Ambulatory Visit (HOSPITAL_COMMUNITY)
Admission: RE | Admit: 2016-03-14 | Discharge: 2016-03-14 | Disposition: A | Discharge status: Home / Self Care | Payer: Medicaid Other | Source: Ambulatory Visit | Attending: Interventional Radiology | Admitting: Interventional Radiology

## 2016-03-14 ENCOUNTER — Encounter (HOSPITAL_COMMUNITY): Payer: Self-pay

## 2016-03-14 DIAGNOSIS — T814XXD Infection following a procedure, subsequent encounter: Secondary | ICD-10-CM | POA: Insufficient documentation

## 2016-03-14 DIAGNOSIS — K750 Abscess of liver: Secondary | ICD-10-CM | POA: Insufficient documentation

## 2016-03-14 DIAGNOSIS — Z4803 Encounter for change or removal of drains: Secondary | ICD-10-CM | POA: Insufficient documentation

## 2016-03-14 DIAGNOSIS — Z79899 Other long term (current) drug therapy: Secondary | ICD-10-CM | POA: Insufficient documentation

## 2016-03-14 DIAGNOSIS — Z7901 Long term (current) use of anticoagulants: Secondary | ICD-10-CM | POA: Diagnosis not present

## 2016-03-14 DIAGNOSIS — W3400XD Accidental discharge from unspecified firearms or gun, subsequent encounter: Secondary | ICD-10-CM | POA: Diagnosis not present

## 2016-03-14 HISTORY — PX: IR GENERIC HISTORICAL: IMG1180011

## 2016-03-14 LAB — COMPREHENSIVE METABOLIC PANEL
ALT: 31 U/L (ref 17–63)
AST: 17 U/L (ref 15–41)
Albumin: 3 g/dL — ABNORMAL LOW (ref 3.5–5.0)
Alkaline Phosphatase: 94 U/L (ref 38–126)
Anion gap: 7 (ref 5–15)
BUN: 11 mg/dL (ref 6–20)
CO2: 24 mmol/L (ref 22–32)
Calcium: 8.6 mg/dL — ABNORMAL LOW (ref 8.9–10.3)
Chloride: 108 mmol/L (ref 101–111)
Creatinine, Ser: 0.88 mg/dL (ref 0.61–1.24)
GFR calc Af Amer: >60 mL/min (ref >60)
GFR calc non Af Amer: >60 mL/min (ref >60)
Glucose, Bld: 90 mg/dL (ref 65–99)
Potassium: 3 mmol/L — ABNORMAL LOW (ref 3.5–5.1)
Sodium: 139 mmol/L (ref 135–145)
Total Bilirubin: 0.7 mg/dL (ref 0.3–1.2)
Total Protein: 7.6 g/dL (ref 6.5–8.1)

## 2016-03-14 LAB — CBC WITH DIFFERENTIAL/PLATELET
Basophils Absolute: 0 10*3/uL (ref 0.0–0.1)
Basophils Relative: 0 %
Eosinophils Absolute: 0.3 10*3/uL (ref 0.0–0.7)
Eosinophils Relative: 3 %
HCT: 28.9 % — ABNORMAL LOW (ref 39.0–52.0)
Hemoglobin: 9.3 g/dL — ABNORMAL LOW (ref 13.0–17.0)
Lymphocytes Relative: 18 %
Lymphs Abs: 1.7 10*3/uL (ref 0.7–4.0)
MCH: 27.7 pg (ref 26.0–34.0)
MCHC: 32.2 g/dL (ref 30.0–36.0)
MCV: 86 fL (ref 78.0–100.0)
Monocytes Absolute: 1 10*3/uL (ref 0.1–1.0)
Monocytes Relative: 10 %
Neutro Abs: 6.9 10*3/uL (ref 1.7–7.7)
Neutrophils Relative %: 69 %
Platelets: 304 10*3/uL (ref 150–400)
RBC: 3.36 MIL/uL — ABNORMAL LOW (ref 4.22–5.81)
RDW: 15.8 % — ABNORMAL HIGH (ref 11.5–15.5)
WBC: 9.9 10*3/uL (ref 4.0–10.5)

## 2016-03-14 LAB — PROTIME-INR
INR: 1.18
Prothrombin Time: 15 seconds (ref 11.4–15.2)

## 2016-03-14 MED ORDER — IOPAMIDOL (ISOVUE-300) INJECTION 61%
20.0000 mL | Freq: Once | INTRAVENOUS | Status: AC | PRN
  Administered 2016-03-14: 15 mL

## 2016-03-14 MED ORDER — FENTANYL CITRATE (PF) 100 MCG/2ML IJ SOLN
INTRAMUSCULAR | Status: AC | PRN
  Administered 2016-03-14 (×3): 50 ug via INTRAVENOUS

## 2016-03-14 MED ORDER — FENTANYL CITRATE (PF) 100 MCG/2ML IJ SOLN
INTRAMUSCULAR | Status: AC
  Filled 2016-03-14: qty 4

## 2016-03-14 MED ORDER — MIDAZOLAM HCL 2 MG/2ML IJ SOLN
INTRAMUSCULAR | Status: AC | PRN
  Administered 2016-03-14 (×3): 2 mg via INTRAVENOUS

## 2016-03-14 MED ORDER — MIDAZOLAM HCL 2 MG/2ML IJ SOLN
INTRAMUSCULAR | Status: AC
  Filled 2016-03-14: qty 10

## 2016-03-14 MED ORDER — LEVOFLOXACIN IN D5W 500 MG/100ML IV SOLN
500.0000 mg | Freq: Once | INTRAVENOUS | Status: AC
  Administered 2016-03-14: 500 mg via INTRAVENOUS
  Filled 2016-03-14: qty 100

## 2016-03-14 MED ORDER — LIDOCAINE HCL 1 % IJ SOLN
INTRAMUSCULAR | Status: AC
  Filled 2016-03-14: qty 20

## 2016-03-14 MED ORDER — SODIUM CHLORIDE 0.9 % IV SOLN
INTRAVENOUS | Status: DC
  Administered 2016-03-14: 14:00:00 via INTRAVENOUS

## 2016-03-14 MED ORDER — LIDOCAINE HCL 1 % IJ SOLN
INTRAMUSCULAR | Status: AC | PRN
  Administered 2016-03-14: 5 mL

## 2016-03-14 NOTE — Procedures (Signed)
Interventional Radiology Procedure Note  Procedure:  Exchange/upsizing of perihepatic abscess drain  Complications: None  Estimated Blood Loss: < 10 mL  12 Fr drain exchanged for 14 Fr pigtail drain.  Aspiration yielded 60 mL of pus.  Jodi MarbleGlenn T. Fredia SorrowYamagata, M.D Pager:  [phone]
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
## 2016-03-14 NOTE — Progress Notes (Signed)
Pre procedure asked patient when he last took his home medications. He stated his last medications (including Xarelto) was taken when he was in the ER on 8/21. MAR documentation that Xarelto was given 8/21 at 1832.

## 2016-03-14 NOTE — Discharge Instructions (Signed)
Percutaneous Abscess Drain An abscess is a collection of infected fluid inside the body. Your health care provider may decide to remove or drain the infected fluid from the area by placing a thin needle into the abscess. Usually, a small tube is left in place to drain the abscess fluid. The abscess fluid may take a few days to drain. LET [facility] CARE PROVIDER KNOW ABOUT:  Any allergies you have.  All medicines you are taking, including vitamins, herbs, eye drops, creams, and over-the-counter medicines. This includes steroid medicines by mouth or cream.  Previous problems you or members of your family have had with the use of anesthetics.  Any blood disorders you have.  Previous surgeries you have had.  Possibility of pregnancy, if this applies.  Medical conditions you have.  Any history of smoking. RISKS AND COMPLICATIONS Generally, this is a safe procedure. However, problems can occur and include:   Infection.  Allergic reaction to materials used (such as contrast dye).  Damage to a nearby organ or tissue.  Bleeding.  Blockage of a tube placed to drain the abscess, requiring placement of a new drainage tube.  A need to repeat the procedure.  Failure of the procedure to adequately drain the abscess, requiring an open surgical procedure to do so. BEFORE THE PROCEDURE   Ask your health care provider about:  Changing or stopping your regular medicines. This is especially important if you are taking diabetes medicines or blood thinners.  Taking medicines such as aspirin and ibuprofen. These medicines can thin your blood. Do not take these medicines before your procedure if your health care provider asks you not to.  Your health care provider may do some blood or urine tests. These will help your health care provider learn how well your kidneys and liver are working and how well your blood clots.  Do not eat or drink anything after midnight on the night before the  procedure or as directed by your health care provider.  Make arrangements for someone to drive you home after the procedure.  PROCEDURE   An IV tube will be placed in your arm. Medicine will be able to flow directly into your body through this tube.  You will lie on an X-ray table.  Your heart rate, blood pressure, and breathing will be monitored.   Your oxygen level will also be watched during the procedure. Supplemental oxygen may be given if necessary.  The skin around the area where the drainage tube (catheter) will be placed will be cleaned and numbed.  A small cut (incision) will then be made to insert the drainage tube. The drainage tube will be inserted using X-ray or CT scan to help direct where it should be placed.  The drainage tube will be guided into the abscess to drain the infected fluid.  The drainage tube may stay in place and be connected to a bag outside your body. It will stay until the fluid has stopped draining and the infection is gone. AFTER THE PROCEDURE  You will be taken to a recovery area where you will stay until the medicines have worn off.  You will stay in bed for several hours.  Your progress will be monitored.   Your blood pressure and pulse will be checked often.   The area of the incision will be checked often.  You may have some pain or feel sick. Tell your health care provider.  As you begin to feel better, you may be given ice,  fluids, and food.   When you can walk, drink, eat, and use the bathroom, you may be able to go home.   This information is not intended to replace advice given to you by your health care provider. Make sure you discuss any questions you have with your health care provider.   Document Released: 11/21/2013 Document Reviewed: 11/21/2013 Elsevier Interactive Patient Education 2016 Elsevier Inc. Percutaneous Abscess Drain, Care After Refer to this sheet in the next few weeks. These instructions provide you  with information on caring for yourself after your procedure. Your health care provider may also give you more specific instructions. Your treatment has been planned according to current medical practices, but problems sometimes occur. Call your health care provider if you have any problems or questions after your procedure. WHAT TO EXPECT AFTER THE PROCEDURE After your procedure, it is typical to have the following:   A small amount of discomfort in the area where the drainage tube was placed.  A small amount of bruising around the area where the drainage tube was placed.  Sleepiness and fatigue for the rest of the day from the medicines used. HOME CARE INSTRUCTIONS  Rest at home for 1-2 days following your procedure or as directed by your health care provider.  If you go home right after the procedure, plan to have someone with you for 24 hours.  Do not take a bathor shower for 24 hours after your procedure.  Take medicines only as directed by your health care provider. Ask your health care provider when you can resume taking any normal medicines.  Change bandages (dressings) as directed.   You may be told to record the amount of drainage from the bag every time you empty it. Follow your health care provider's directions for emptying the bag. Write down the amount of drainage, the date, and the time you emptied it.  Call your health care provider when the drain is putting out less than 10 mL of drainage per day for 2-3 days in a row or as directed by your health care provider.  Follow your health care provider's instructions for cleaning the drainage tube. You may need to clean the tube every day so that it does not clog. SEEK MEDICAL CARE IF:  You have increased bleeding (more than a small spot) from the site where the drainage tube was placed.  You have redness, swelling, or increasing pain around the site where the drainage tube was placed.  You notice a discharge or bad smell  coming from the site where the drainage tube was placed.  You have a fever or chills.  You have pain that is not helped by medicine.  SEEK IMMEDIATE MEDICAL CARE IF:  There is leakage around the drainage tube.  The drainage tube pulls out.  You suddenly stop having drainage from the tube.  You suddenly have blood in the drainage fluid.  You become dizzy or faint.  You develop a rash.   You have nausea or vomiting.  You have difficulty breathing, feel short of breath, or feel faint.   You develop chest pain.  You have problems with your speech or vision.  You have trouble balancing or moving your arms or legs.   This information is not intended to replace advice given to you by your health care provider. Make sure you discuss any questions you have with your health care provider.   Document Released: 11/21/2013 Document Revised: 04/25/2014 Document Reviewed: 11/21/2013 Elsevier Interactive Patient Education  2016 Elsevier Inc. Moderate Conscious Sedation, Adult, Care After Refer to this sheet in the next few weeks. These instructions provide you with information on caring for yourself after your procedure. Your health care provider may also give you more specific instructions. Your treatment has been planned according to current medical practices, but problems sometimes occur. Call your health care provider if you have any problems or questions after your procedure. WHAT TO EXPECT AFTER THE PROCEDURE  After your procedure:  You may feel sleepy, clumsy, and have poor balance for several hours.  Vomiting may occur if you eat too soon after the procedure. HOME CARE INSTRUCTIONS  Do not participate in any activities where you could become injured for at least 24 hours. Do not:  Drive.  Swim.  Ride a bicycle.  Operate heavy machinery.  Cook.  Use power tools.  Climb ladders.  Work from a high place.  Do not make important decisions or sign legal documents  until you are improved.  If you vomit, drink water, juice, or soup when you can drink without vomiting. Make sure you have little or no nausea before eating solid foods.  Only take over-the-counter or prescription medicines for pain, discomfort, or fever as directed by your health care provider.  Make sure you and your family fully understand everything about the medicines given to you, including what side effects may occur.  You should not drink alcohol, take sleeping pills, or take medicines that cause drowsiness for at least 24 hours.  If you smoke, do not smoke without supervision.  If you are feeling better, you may resume normal activities 24 hours after you were sedated.  Keep all appointments with your health care provider. SEEK MEDICAL CARE IF:  Your skin is pale or bluish in color.  You continue to feel nauseous or vomit.  Your pain is getting worse and is not helped by medicine.  You have bleeding or swelling.  You are still sleepy or feeling clumsy after 24 hours. SEEK IMMEDIATE MEDICAL CARE IF:  You develop a rash.  You have difficulty breathing.  You develop any type of allergic problem.  You have a fever. MAKE SURE YOU:  Understand these instructions.  Will watch your condition.  Will get help right away if you are not doing well or get worse.   This information is not intended to replace advice given to you by your health care provider. Make sure you discuss any questions you have with your health care provider.   Document Released: 04/27/2013 Document Revised: 07/28/2014 Document Reviewed: 04/27/2013 Elsevier Interactive Patient Education [employer] Elsevier Inc.

## 2016-03-19 LAB — AEROBIC/ANAEROBIC CULTURE (SURGICAL/DEEP WOUND)

## 2016-03-19 LAB — AEROBIC/ANAEROBIC CULTURE W GRAM STAIN (SURGICAL/DEEP WOUND): Gram Stain: NONE SEEN

## 2016-04-02 ENCOUNTER — Ambulatory Visit
Admission: RE | Admit: 2016-04-02 | Discharge: 2016-04-02 | Disposition: A | Discharge status: Home / Self Care | Payer: Medicaid Other | Source: Ambulatory Visit | Attending: General Surgery | Admitting: General Surgery

## 2016-04-02 ENCOUNTER — Other Ambulatory Visit (HOSPITAL_COMMUNITY): Payer: Self-pay | Admitting: Interventional Radiology

## 2016-04-02 ENCOUNTER — Other Ambulatory Visit: Payer: Self-pay

## 2016-04-02 DIAGNOSIS — K651 Peritoneal abscess: Principal | ICD-10-CM

## 2016-04-02 DIAGNOSIS — T814XXD Infection following a procedure, subsequent encounter: Principal | ICD-10-CM

## 2016-04-02 DIAGNOSIS — IMO0001 Reserved for inherently not codable concepts without codable children: Secondary | ICD-10-CM

## 2016-04-02 HISTORY — PX: IR GENERIC HISTORICAL: IMG1180011

## 2016-04-02 MED ORDER — IOPAMIDOL (ISOVUE-300) INJECTION 61%
100.0000 mL | Freq: Once | INTRAVENOUS | Status: AC | PRN
  Administered 2016-04-02: 100 mL via INTRAVENOUS
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
## 2016-04-03 ENCOUNTER — Encounter (HOSPITAL_COMMUNITY): Payer: Self-pay | Admitting: *Deleted

## 2016-04-03 DIAGNOSIS — Z4801 Encounter for change or removal of surgical wound dressing: Secondary | ICD-10-CM | POA: Insufficient documentation

## 2016-04-03 DIAGNOSIS — Z5321 Procedure and treatment not carried out due to patient leaving prior to being seen by health care provider: Secondary | ICD-10-CM | POA: Diagnosis not present

## 2016-04-03 DIAGNOSIS — Z87891 Personal history of nicotine dependence: Secondary | ICD-10-CM | POA: Insufficient documentation

## 2016-04-03 NOTE — ED Triage Notes (Signed)
Here for "sutures holding my RLQ "abscess" tube came out", but also mentions pain, ~2cm of tubing proximal to sutures showing. Also mentions abd pain and side pain, and finished pain med. Rates 9/10. Finished abx. Denies sob, nvd, dizziness or fever. Alert, NAD, calm.

## 2016-04-04 ENCOUNTER — Encounter (HOSPITAL_COMMUNITY): Payer: Self-pay | Admitting: Emergency Medicine

## 2016-04-04 ENCOUNTER — Emergency Department (HOSPITAL_COMMUNITY)
Admission: EM | Admit: 2016-04-04 | Discharge: 2016-04-04 | Disposition: A | Discharge status: Home / Self Care | Payer: Medicaid Other | Source: Home / Self Care | Attending: Emergency Medicine | Admitting: Emergency Medicine

## 2016-04-04 ENCOUNTER — Emergency Department (HOSPITAL_COMMUNITY)
Admission: EM | Admit: 2016-04-04 | Discharge: 2016-04-04 | Disposition: A | Discharge status: Home / Self Care | Payer: Medicaid Other | Attending: Dermatology | Admitting: Dermatology

## 2016-04-04 DIAGNOSIS — Z7901 Long term (current) use of anticoagulants: Secondary | ICD-10-CM | POA: Insufficient documentation

## 2016-04-04 DIAGNOSIS — Z4801 Encounter for change or removal of surgical wound dressing: Secondary | ICD-10-CM | POA: Insufficient documentation

## 2016-04-04 DIAGNOSIS — Z5189 Encounter for other specified aftercare: Secondary | ICD-10-CM

## 2016-04-04 DIAGNOSIS — Z87891 Personal history of nicotine dependence: Secondary | ICD-10-CM | POA: Insufficient documentation

## 2016-04-04 MED ORDER — LIDOCAINE HCL (PF) 1 % IJ SOLN
5.0000 mL | Freq: Once | INTRAMUSCULAR | Status: AC
  Administered 2016-04-04: 5 mL via INTRADERMAL
  Filled 2016-04-04: qty 5

## 2016-04-04 NOTE — ED Triage Notes (Signed)
Pt called numerous times for vitals recheck with no response.

## 2016-04-04 NOTE — ED Triage Notes (Signed)
Has drainage tube that was placed in rt lower abd x 3 months ago pt states and the sutures holding the tube have popped off pt wants them replaced
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
## 2016-04-04 NOTE — ED Notes (Signed)
No answer when called for VS.  Patient not outside 
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
## 2016-04-04 NOTE — ED Notes (Signed)
Pt requesting refill on pain meds

## 2016-04-16 ENCOUNTER — Ambulatory Visit
Admission: RE | Admit: 2016-04-16 | Discharge: 2016-04-16 | Disposition: A | Discharge status: Home / Self Care | Payer: Medicaid Other | Source: Ambulatory Visit | Attending: Interventional Radiology | Admitting: Interventional Radiology

## 2016-04-16 DIAGNOSIS — K651 Peritoneal abscess: Principal | ICD-10-CM

## 2016-04-16 DIAGNOSIS — T814XXD Infection following a procedure, subsequent encounter: Principal | ICD-10-CM

## 2016-04-16 DIAGNOSIS — IMO0001 Reserved for inherently not codable concepts without codable children: Secondary | ICD-10-CM

## 2016-04-16 HISTORY — PX: IR GENERIC HISTORICAL: IMG1180011
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
## 2016-07-24 ENCOUNTER — Encounter: Payer: Self-pay | Admitting: Interventional Radiology

## 2016-09-20 ENCOUNTER — Emergency Department
Admission: EM | Admit: 2016-09-20 | Discharge: 2016-09-20 | Disposition: A | Discharge status: Home / Self Care | Payer: Medicaid Other | Attending: Emergency Medicine | Admitting: Emergency Medicine

## 2016-09-20 ENCOUNTER — Encounter: Payer: Self-pay | Admitting: Emergency Medicine

## 2016-09-20 DIAGNOSIS — R1909 Other intra-abdominal and pelvic swelling, mass and lump: Secondary | ICD-10-CM | POA: Diagnosis present

## 2016-09-20 DIAGNOSIS — L0291 Cutaneous abscess, unspecified: Secondary | ICD-10-CM

## 2016-09-20 DIAGNOSIS — Z79899 Other long term (current) drug therapy: Secondary | ICD-10-CM | POA: Diagnosis not present

## 2016-09-20 DIAGNOSIS — F17210 Nicotine dependence, cigarettes, uncomplicated: Secondary | ICD-10-CM | POA: Insufficient documentation

## 2016-09-20 DIAGNOSIS — L02214 Cutaneous abscess of groin: Secondary | ICD-10-CM | POA: Insufficient documentation

## 2016-09-20 MED ORDER — CLINDAMYCIN HCL 300 MG PO CAPS: 300.0000 mg | ORAL_CAPSULE | Freq: Three times a day (TID) | ORAL | 0 refills | Status: DC

## 2016-09-20 MED ORDER — SULFAMETHOXAZOLE-TRIMETHOPRIM 800-160 MG PO TABS: 1.0000 | ORAL_TABLET | Freq: Two times a day (BID) | ORAL | 0 refills | Status: DC

## 2016-09-20 MED ORDER — CLINDAMYCIN PHOSPHATE 300 MG/50ML IV SOLN: 300.0000 mg | Freq: Once | INTRAVENOUS | Status: DC

## 2016-09-20 MED ORDER — CLINDAMYCIN HCL 150 MG PO CAPS
300.0000 mg | ORAL_CAPSULE | Freq: Once | ORAL | Status: AC
  Administered 2016-09-20: 300 mg via ORAL
  Filled 2016-09-20: qty 2

## 2016-09-20 MED ORDER — LIDOCAINE HCL (PF) 1 % IJ SOLN
INTRAMUSCULAR | Status: AC
  Filled 2016-09-20: qty 5

## 2016-09-20 NOTE — ED Notes (Signed)
See triage note  States he developed a possible abscess area to right groin area about 4 days ago
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
## 2016-09-20 NOTE — ED Triage Notes (Signed)
Abscess R thigh x 4 days, denies drainage.

## 2016-09-20 NOTE — Discharge Instructions (Signed)
You were evaluated for abscess which drained in the ER.  I am starting on antibiotic. Return to the emergency department immediately for any worsening pain, swelling, fevers, abdominal pain, or any other symptoms concerning to you.

## 2016-12-10 ENCOUNTER — Encounter: Payer: Self-pay | Admitting: Emergency Medicine

## 2016-12-10 ENCOUNTER — Emergency Department
Admission: EM | Admit: 2016-12-10 | Discharge: 2016-12-10 | Disposition: A | Discharge status: Home / Self Care | Payer: Medicaid Other | Attending: Emergency Medicine | Admitting: Emergency Medicine

## 2016-12-10 DIAGNOSIS — K4090 Unilateral inguinal hernia, without obstruction or gangrene, not specified as recurrent: Secondary | ICD-10-CM | POA: Insufficient documentation

## 2016-12-10 DIAGNOSIS — F17210 Nicotine dependence, cigarettes, uncomplicated: Secondary | ICD-10-CM | POA: Insufficient documentation

## 2016-12-10 NOTE — ED Triage Notes (Signed)
Pt in via POV with complaints of hernia to left pelvic area x 3 weeks with pain radiating down into groin.  Pt reports pain is worsening.  Vitals WDL, NAD noted at this time.
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
## 2016-12-22 ENCOUNTER — Ambulatory Visit: Payer: Self-pay | Admitting: Surgery

## 2016-12-23 ENCOUNTER — Telehealth: Payer: Self-pay

## 2016-12-23 NOTE — Telephone Encounter (Signed)
Patient was a NO SHOW for surgical follow-up of Inguinal Hernia for which he was seen in the ED recently. Call made to patient at this time to reschedule. No answer. Left voicemail for return phone call.

## 2017-09-04 IMAGING — CR DG CHEST 1V PORT
1 series · 1 of 1 positions shown · non-contrast
Comparison: Chest radiograph performed earlier today at [DATE] p.m.

CLINICAL DATA: Endotracheal tube and orogastric tube placement.
Central line and right-sided chest tube placement. Initial
encounter.

EXAM:
PORTABLE CHEST 1 VIEW

[AP]
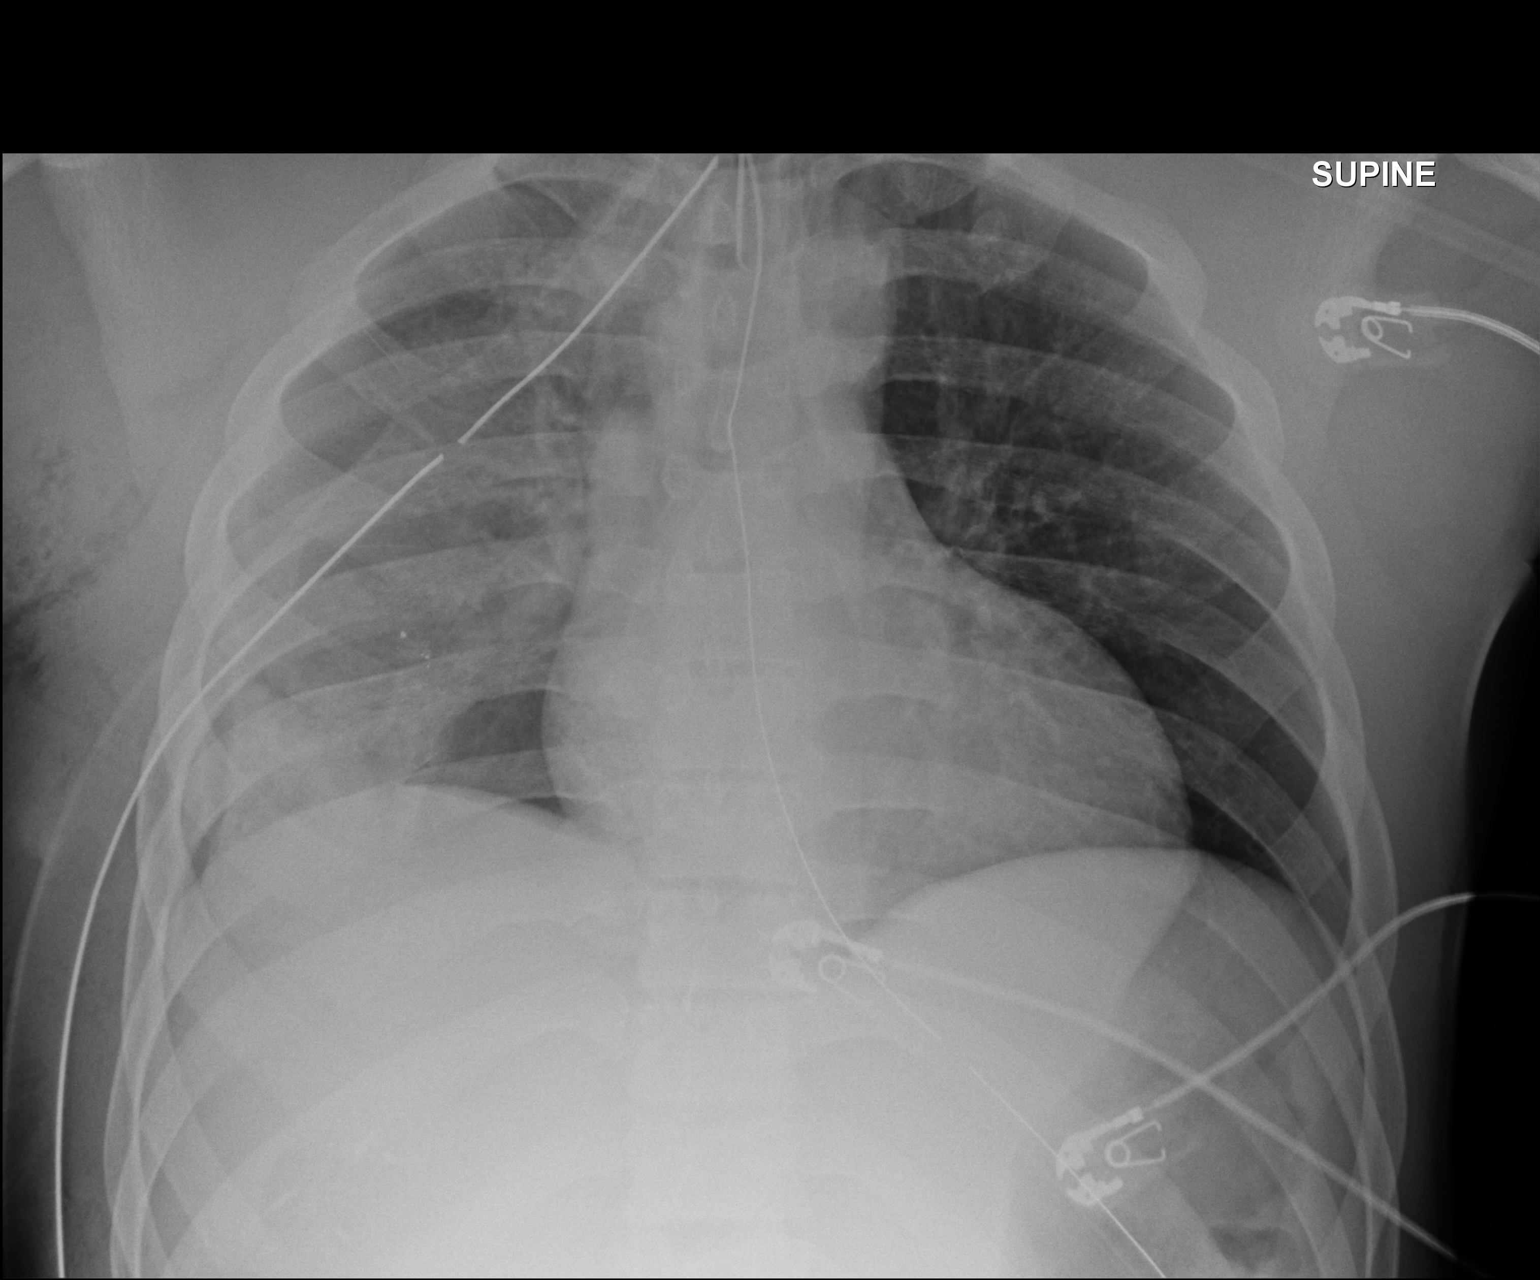

[1 of 1 positions shown; findings below may reference images not displayed]

FINDINGS: The patient's endotracheal tube is seen ending 4 cm above the
carina. An enteric tube is noted extending below the diaphragm.

A right-sided chest tube is noted ending overlying the right lung
apex. No central line is visualized on this study.

There is a small right basilar pneumothorax, with hazy opacification
of the right lung, possibly reflecting a combination of layering
pleural effusion and atelectasis. The left lung appears clear.

The cardiomediastinal silhouette is borderline normal in size. No
acute osseous abnormalities are seen. Scattered soft tissue air is
noted at the right axilla.
IMPRESSION: 1. Endotracheal tube seen ending 4 cm above the carina.
2. Right-sided chest tube overlies the right lung apex. Small right
basilar pneumothorax noted, with hazy opacification of the right
lung, possibly reflecting a combination of layering pleural effusion
and atelectasis.
3. Scattered soft tissue air at the right axilla.
These results were called by telephone at the time of interpretation
on 01/03/2016 at [DATE] to Dr. ROBERTO TIGER, who verbally
acknowledged these results.

## 2017-09-05 IMAGING — CR DG CHEST 1V PORT
1 series · 1 of 1 positions shown · non-contrast
Comparison: January 03, 2016

CLINICAL DATA: Hypoxia

EXAM:
PORTABLE CHEST 1 VIEW

[ap]
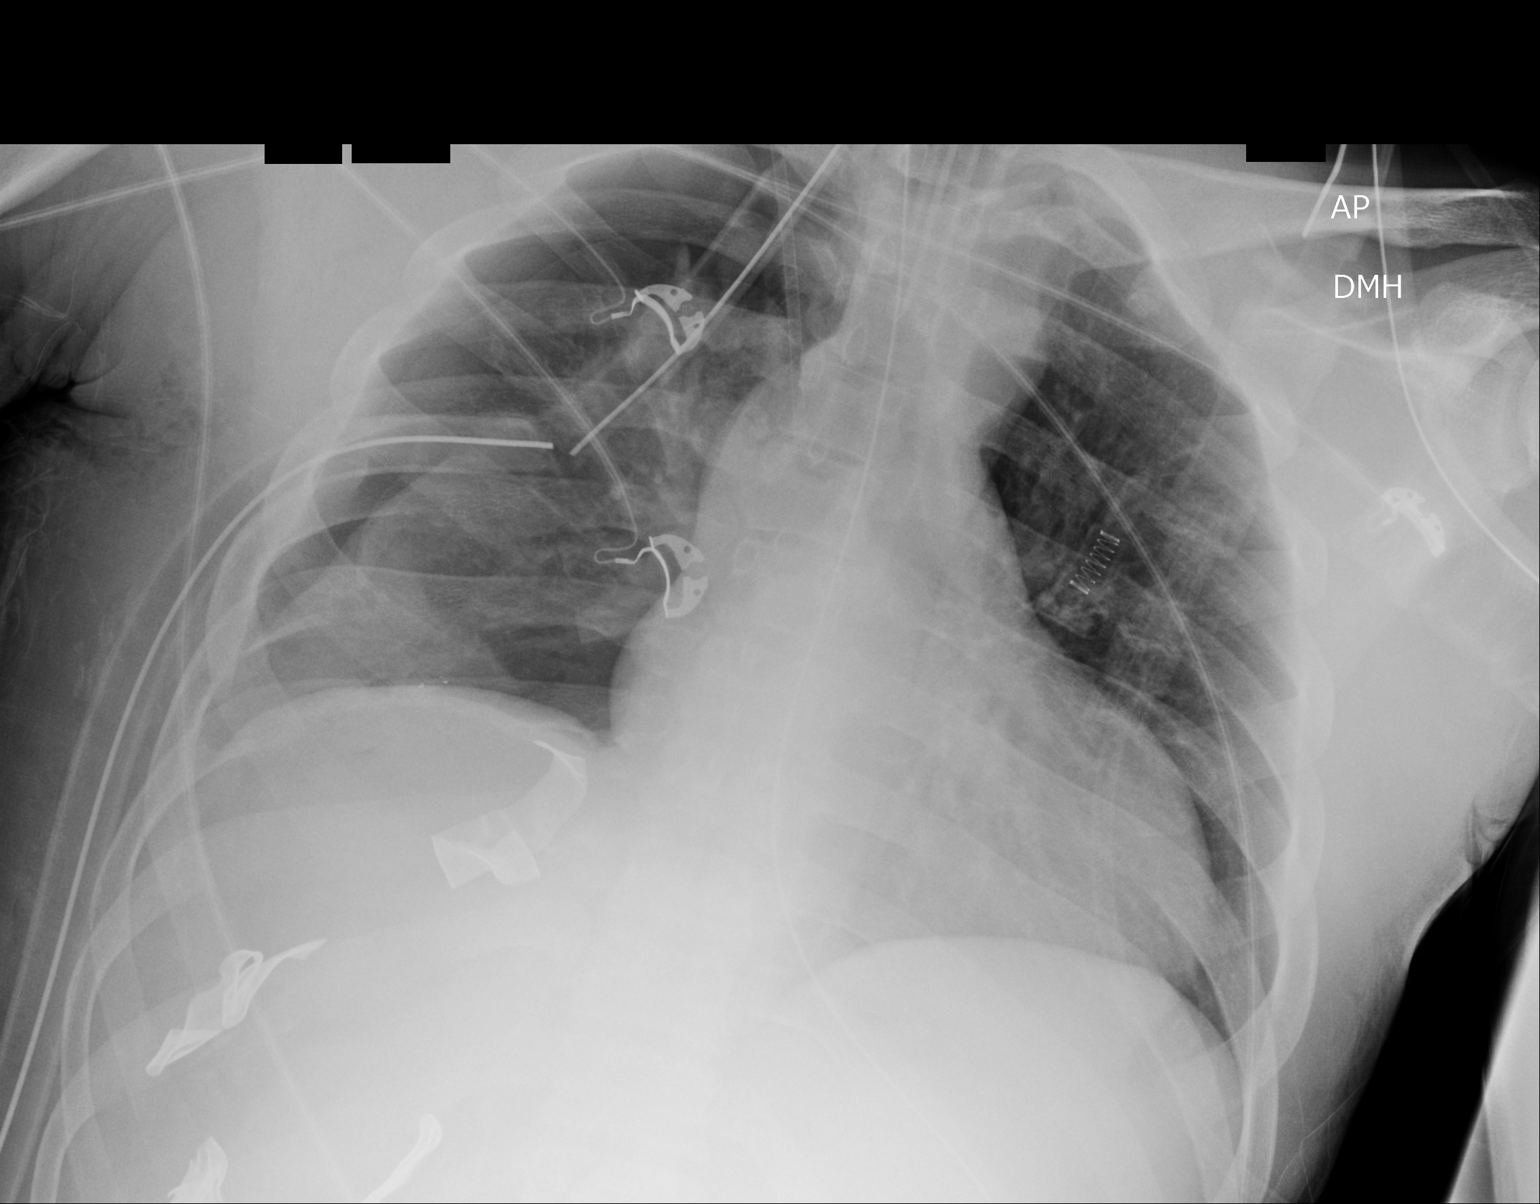

[1 of 1 positions shown; findings below may reference images not displayed]

FINDINGS: Endotracheal tube tip is 3.6 cm above the carina. Nasogastric tube
tip and side port below the diaphragm. Right jugular catheter tip is
in the region of the superior vena cava. Chest tube is again noted
on the right with sizable pneumothorax again noted on the right. No
tension component. Soft tissue air remains on the right peer

Left lung is clear. There is atelectatic change in the right lung,
stable. No new opacity. Heart is prominent with stable pulmonary
vascularity. No adenopathy evident.
IMPRESSION: Right chest tube remains in place with sizable pneumothorax on the
right again noted. There is also soft tissue air on the right
laterally. Left lung is clear. Atelectatic change in the right lung
diffusely remains. Stable cardiac silhouette. Tube and catheter
positions as described.

## 2017-09-07 IMAGING — CR DG CHEST 1V PORT
1 series · 1 of 1 positions shown · non-contrast
Comparison: 01/05/2016

CLINICAL DATA: Follow-up of hemothorax.

EXAM:
PORTABLE CHEST 1 VIEW

[AP]
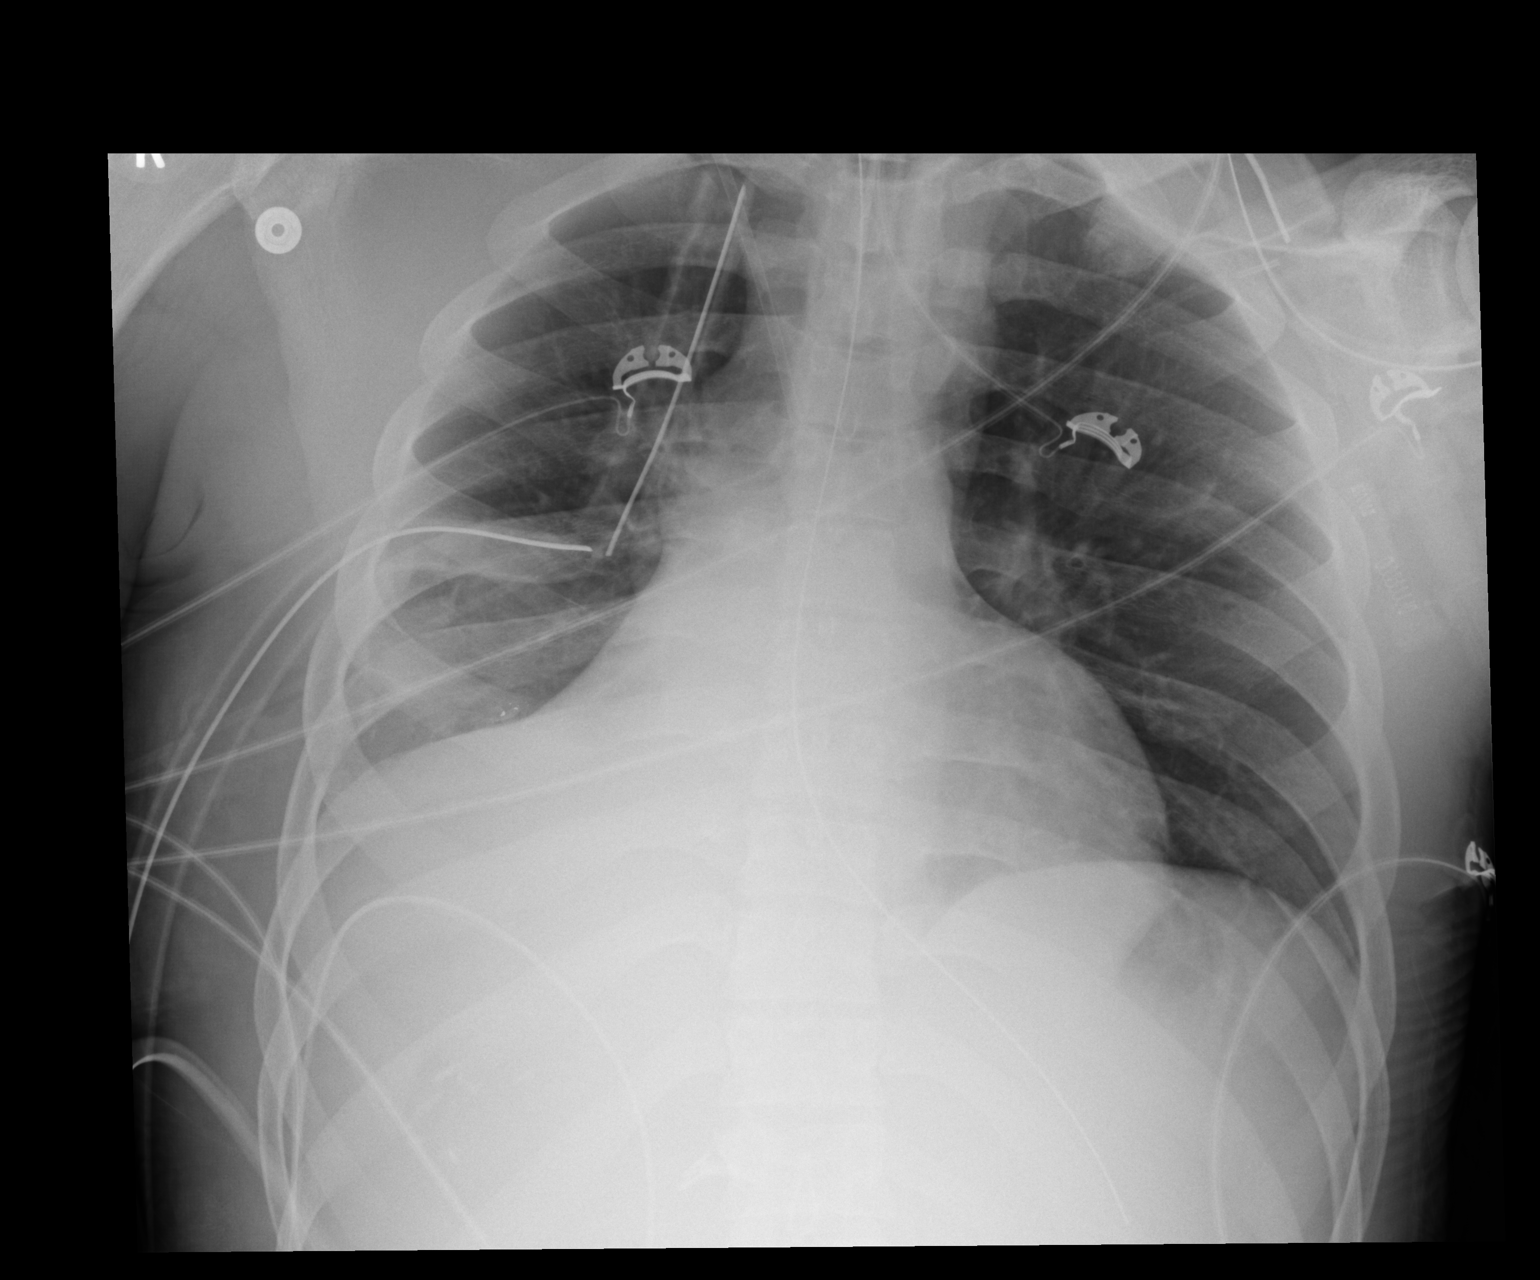

[1 of 1 positions shown; findings below may reference images not displayed]

FINDINGS: Endotracheal tube position is stable, tip at the level of clavicular
heads. Enteric catheter is also stable, tip collimated off the
image. Right-sided chest tube is stable in position, making a 90
degree angle at the level of the side hole.

Cardiomediastinal silhouette is normal. Mediastinal contours appear
intact.

There is a small apical pneumothorax. Right lung base aeration is
improving with partial resolution of previously seen consolidation.
The left lung is clear.

Osseous structures are without acute abnormality. Soft tissues are
grossly normal.
IMPRESSION: Stable position of right-sided chest tube with small residual apical
pneumothorax.

Improving aeration of the right lung.

Remaining of support apparatus stable.
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
## 2019-01-16 ENCOUNTER — Emergency Department (HOSPITAL_COMMUNITY): Payer: Medicaid Other

## 2019-01-16 ENCOUNTER — Other Ambulatory Visit: Payer: Self-pay

## 2019-01-16 ENCOUNTER — Emergency Department (HOSPITAL_COMMUNITY)
Admission: EM | Admit: 2019-01-16 | Discharge: 2019-01-16 | Disposition: A | Discharge status: Home / Self Care | Payer: Medicaid Other | Attending: Emergency Medicine | Admitting: Emergency Medicine

## 2019-01-16 DIAGNOSIS — Z79899 Other long term (current) drug therapy: Secondary | ICD-10-CM | POA: Insufficient documentation

## 2019-01-16 DIAGNOSIS — Y999 Unspecified external cause status: Secondary | ICD-10-CM | POA: Diagnosis not present

## 2019-01-16 DIAGNOSIS — S6992XA Unspecified injury of left wrist, hand and finger(s), initial encounter: Secondary | ICD-10-CM | POA: Diagnosis present

## 2019-01-16 DIAGNOSIS — F17210 Nicotine dependence, cigarettes, uncomplicated: Secondary | ICD-10-CM | POA: Diagnosis not present

## 2019-01-16 DIAGNOSIS — S62397A Other fracture of fifth metacarpal bone, left hand, initial encounter for closed fracture: Secondary | ICD-10-CM | POA: Diagnosis not present

## 2019-01-16 DIAGNOSIS — Y939 Activity, unspecified: Secondary | ICD-10-CM | POA: Diagnosis not present

## 2019-01-16 DIAGNOSIS — Y929 Unspecified place or not applicable: Secondary | ICD-10-CM | POA: Insufficient documentation

## 2019-01-16 DIAGNOSIS — T300 Burn of unspecified body region, unspecified degree: Secondary | ICD-10-CM

## 2019-01-16 DIAGNOSIS — W39XXXA Discharge of firework, initial encounter: Secondary | ICD-10-CM | POA: Diagnosis not present

## 2019-01-16 MED ORDER — CEPHALEXIN 500 MG PO CAPS: 500.0000 mg | ORAL_CAPSULE | Freq: Two times a day (BID) | ORAL | 0 refills | Status: DC

## 2019-01-16 MED ORDER — HYDROCODONE-ACETAMINOPHEN 5-325 MG PO TABS: 1.0000 | ORAL_TABLET | Freq: Four times a day (QID) | ORAL | 0 refills | Status: DC | PRN

## 2019-01-16 MED ORDER — CEPHALEXIN 250 MG PO CAPS
500.0000 mg | ORAL_CAPSULE | Freq: Once | ORAL | Status: AC
  Administered 2019-01-16: 500 mg via ORAL
  Filled 2019-01-16: qty 2

## 2019-01-16 MED ORDER — TETANUS-DIPHTH-ACELL PERTUSSIS 5-2.5-18.5 LF-MCG/0.5 IM SUSP: 0.5000 mL | Freq: Once | INTRAMUSCULAR | Status: DC

## 2019-01-16 MED ORDER — HYDROCODONE-ACETAMINOPHEN 5-325 MG PO TABS
1.0000 | ORAL_TABLET | Freq: Once | ORAL | Status: AC
  Administered 2019-01-16: 1 via ORAL
  Filled 2019-01-16: qty 1
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
## 2019-01-16 NOTE — ED Notes (Signed)
Pt reported last tetanus shot 2018

## 2019-01-16 NOTE — ED Notes (Signed)
Wound dressing applied. bacitracin + Vaseline gauze, secured dressing pt tolerated well. Finger splint applied and buddy taped.

## 2019-01-16 NOTE — ED Notes (Signed)
Ortho tech stated TKO splint not carried at this facility, and can be acquired out patient medical supply store. Advised ulnar splint for the time being.

## 2019-01-21 ENCOUNTER — Emergency Department (HOSPITAL_COMMUNITY)
Admission: EM | Admit: 2019-01-21 | Discharge: 2019-01-21 | Disposition: A | Discharge status: Home / Self Care | Payer: Medicaid Other | Attending: Emergency Medicine | Admitting: Emergency Medicine

## 2019-01-21 ENCOUNTER — Encounter (HOSPITAL_COMMUNITY): Payer: Self-pay | Admitting: Emergency Medicine

## 2019-01-21 ENCOUNTER — Other Ambulatory Visit: Payer: Self-pay

## 2019-01-21 ENCOUNTER — Emergency Department (HOSPITAL_COMMUNITY): Payer: Medicaid Other

## 2019-01-21 DIAGNOSIS — T83192A Other mechanical complication of urinary stent, initial encounter: Secondary | ICD-10-CM

## 2019-01-21 DIAGNOSIS — N200 Calculus of kidney: Secondary | ICD-10-CM

## 2019-01-21 DIAGNOSIS — Z79899 Other long term (current) drug therapy: Secondary | ICD-10-CM | POA: Insufficient documentation

## 2019-01-21 DIAGNOSIS — R109 Unspecified abdominal pain: Secondary | ICD-10-CM | POA: Diagnosis present

## 2019-01-21 DIAGNOSIS — N12 Tubulo-interstitial nephritis, not specified as acute or chronic: Secondary | ICD-10-CM

## 2019-01-21 DIAGNOSIS — Y732 Prosthetic and other implants, materials and accessory gastroenterology and urology devices associated with adverse incidents: Secondary | ICD-10-CM | POA: Diagnosis not present

## 2019-01-21 DIAGNOSIS — Z7901 Long term (current) use of anticoagulants: Secondary | ICD-10-CM | POA: Diagnosis not present

## 2019-01-21 DIAGNOSIS — F17210 Nicotine dependence, cigarettes, uncomplicated: Secondary | ICD-10-CM | POA: Insufficient documentation

## 2019-01-21 DIAGNOSIS — R451 Restlessness and agitation: Secondary | ICD-10-CM | POA: Diagnosis not present

## 2019-01-21 LAB — BASIC METABOLIC PANEL
Anion gap: 8 (ref 5–15)
BUN: 13 mg/dL (ref 6–20)
CO2: 23 mmol/L (ref 22–32)
Calcium: 9.1 mg/dL (ref 8.9–10.3)
Chloride: 110 mmol/L (ref 98–111)
Creatinine, Ser: 1.37 mg/dL — ABNORMAL HIGH (ref 0.61–1.24)
GFR calc Af Amer: >60 mL/min (ref >60)
GFR calc non Af Amer: >60 mL/min (ref >60)
Glucose, Bld: 98 mg/dL (ref 70–99)
Potassium: 4.1 mmol/L (ref 3.5–5.1)
Sodium: 141 mmol/L (ref 135–145)

## 2019-01-21 LAB — URINALYSIS, ROUTINE W REFLEX MICROSCOPIC
Bilirubin Urine: NEGATIVE
Glucose, UA: NEGATIVE mg/dL
Ketones, ur: NEGATIVE mg/dL
Nitrite: POSITIVE — AB
Protein, ur: >=300 mg/dL — AB
Specific Gravity, Urine: 1.019 (ref 1.005–1.030)
WBC, UA: >50 WBC/hpf — ABNORMAL HIGH (ref 0–5)
pH: 6 (ref 5.0–8.0)

## 2019-01-21 LAB — CBC
HCT: 48.6 % (ref 39.0–52.0)
Hemoglobin: 16 g/dL (ref 13.0–17.0)
MCH: 30.5 pg (ref 26.0–34.0)
MCHC: 32.9 g/dL (ref 30.0–36.0)
MCV: 92.6 fL (ref 80.0–100.0)
Platelets: 254 10*3/uL (ref 150–400)
RBC: 5.25 MIL/uL (ref 4.22–5.81)
RDW: 14 % (ref 11.5–15.5)
WBC: 11.1 10*3/uL — ABNORMAL HIGH (ref 4.0–10.5)
nRBC: 0 % (ref 0.0–0.2)

## 2019-01-21 MED ORDER — CEFTRIAXONE SODIUM 1 G IJ SOLR
1.0000 g | Freq: Once | INTRAMUSCULAR | Status: AC
  Administered 2019-01-21: 1 g via INTRAMUSCULAR
  Filled 2019-01-21: qty 10

## 2019-01-21 MED ORDER — SODIUM CHLORIDE 0.9 % IV BOLUS
1000.0000 mL | Freq: Once | INTRAVENOUS | Status: AC
  Administered 2019-01-21: 1000 mL via INTRAVENOUS

## 2019-01-21 MED ORDER — SODIUM CHLORIDE 0.9 % IV SOLN: 1.0000 g | Freq: Once | INTRAVENOUS | Status: DC

## 2019-01-21 MED ORDER — CEPHALEXIN 500 MG PO CAPS: 500.0000 mg | ORAL_CAPSULE | Freq: Four times a day (QID) | ORAL | 0 refills | Status: AC

## 2019-01-21 MED ORDER — STERILE WATER FOR INJECTION IJ SOLN
INTRAMUSCULAR | Status: AC
  Filled 2019-01-21: qty 10

## 2019-01-21 MED ORDER — SODIUM CHLORIDE 0.9 % IV BOLUS: 1000.0000 mL | Freq: Once | INTRAVENOUS | Status: DC

## 2019-01-21 NOTE — ED Notes (Signed)
Patient Alert and oriented to baseline. Stable and ambulatory to baseline. Patient verbalized understanding of the discharge instructions.  Patient belongings were taken by the patient.   

## 2019-01-21 NOTE — ED Notes (Signed)
Patient transported to CT 
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
## 2019-01-21 NOTE — ED Notes (Signed)
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 Pt wants to talk to PA.

## 2019-01-21 NOTE — ED Notes (Signed)
Pt refused to let me draw more labs stating he had already had blood work taken. I advised him this was a different tube and test. He still refused any further labs.

## 2019-01-21 NOTE — Discharge Instructions (Signed)
Please read and follow all provided instructions.  Your diagnoses today include:  1. Staghorn kidney stones   2. Pyelonephritis   3. Other mechanical complication of indwelling ureteral stent, initial encounter ([place])     Tests performed today include: Urine test that showed blood in your urine and no infection CT scan which multiple kidney stones in the kidney and bladder.  Blood test that showed normal kidney function Vital signs. See below for your results today.   Medications prescribed:   Take any prescribed medications only as directed.  Please take all of your antibiotics until finished.   THIS IS DOSED 4 TIMES A DAY. You may develop abdominal discomfort or nausea from the antibiotic. If this occurs, you may take it with food. Some patients also get diarrhea with antibiotics. You may help offset this with probiotics which you can buy or get in yogurt. Do not eat or take the probiotics until 2 hours after your antibiotic.   Some people develop allergies to antibiotics. Symptoms of antibiotic allergy can be mild and include a flat rash and itching. They can also be more serious and include:  ?Hives - Hives are raised, red patches of skin that are usually very itchy.  ?Lip or tongue swelling  ?Trouble swallowing or breathing  ?Blistering of the skin or mouth.  If you have any of these serious symptoms, please seek emergency medical care immediately.   Please only take Tylenol for your symptoms.  Do not exceed 4000 mg of Tylenol in 1 day.  Please avoid ibuprofen or naproxen (Advil, Aleve, Motrin) because these can be hard on the kidneys.  Home care instructions:  Follow any educational materials contained in this packet.  Please double your fluid intake for the next several days. Strain your urine and save any stones that may pass.   BE VERY CAREFUL not to take multiple medicines containing Tylenol (also called acetaminophen). Doing so can lead to an overdose which can  damage your liver and cause liver failure and possibly death.   Follow-up instructions: Please follow-up with your urologist or the urologist referral (provided on front page) in the next 1 week for further evaluation of your symptoms.  If you need to return to the Emergency Department, go to [facility]. The urologists are located at [facility].  Return instructions:  If you need to return to the Emergency Department, go to [facility] and not [facility]. The urologists are located at [facility] and can better care for you at this location.  Please return to the Emergency Department if you experience worsening symptoms.  Please return if you develop fever or uncontrolled pain or vomiting. Please return if you have any other emergent concerns.  Additional Information:  Your vital signs today were: BP (!) 166/99 (BP Location: Right Arm)    Pulse (!) 52    Temp 98 F (36.7 C) (Oral)    Resp 14    SpO2 100%  If your blood pressure (BP) was elevated above 135/85 this visit, please have this repeated by your doctor within one month. --------------

## 2019-01-21 NOTE — ED Triage Notes (Signed)
Pt with right lower flank pain and blood at the end of his urine stream since last pm. No fever, n/v/d. VSS/

## 2019-01-23 LAB — URINE CULTURE: Culture: 50000 — AB

## 2019-01-24 ENCOUNTER — Telehealth: Payer: Self-pay | Admitting: Emergency Medicine

## 2019-01-24 NOTE — Telephone Encounter (Signed)
Post ED Visit - Positive Culture Follow-up  Culture report reviewed by antimicrobial stewardship pharmacist: [place] Team []  Elenor Quinones, Pharm.D. []  Heide Guile, Pharm.D., BCPS AQ-ID []  Parks Neptune, Pharm.D., BCPS []  Alycia Rossetti, Pharm.D., BCPS []  [place], Pharm.D., BCPS, AAHIVP []  Legrand Como, Pharm.D., BCPS, AAHIVP []  Salome Arnt, PharmD, BCPS []  Johnnette Gourd, PharmD, BCPS []  Hughes Better, PharmD, BCPS []  Leeroy Cha, PharmD []  Laqueta Linden, PharmD, BCPS []  Albertina Parr, PharmD Gorden Harms PharmD  [place] Team []  Leodis Sias, PharmD []  Lindell Spar, PharmD []  Royetta Asal, PharmD []  Graylin Shiver, Rph []  Rema Fendt) Glennon Mac, PharmD []  Arlyn Dunning, PharmD []  Netta Cedars, PharmD []  Dia Sitter, PharmD []  Leone Haven, PharmD []  Gretta Arab, PharmD []  Theodis Shove, PharmD []  Peggyann Juba, PharmD []  Reuel Boom, PharmD   Positive urine culture Treated with cephalexin, organism sensitive to the same and no further patient follow-up is required at this time.  Hazle Nordmann 01/24/2019, 8:26 AM

## 2019-01-27 ENCOUNTER — Other Ambulatory Visit (HOSPITAL_COMMUNITY): Payer: Self-pay | Admitting: Urology

## 2019-01-27 ENCOUNTER — Other Ambulatory Visit: Payer: Self-pay | Admitting: Urology

## 2019-01-27 DIAGNOSIS — N200 Calculus of kidney: Secondary | ICD-10-CM

## 2019-02-02 ENCOUNTER — Other Ambulatory Visit (HOSPITAL_COMMUNITY): Payer: Self-pay | Admitting: *Deleted

## 2019-02-02 NOTE — Patient Instructions (Signed)
YOU NEED TO HAVE A COVID 19 TEST ON 02-04-2019 AT 1005 AM.  THIS TEST MUST BE DONE BEFORE SURGERY, COME TO [facility] ENTRANCE. ONCE YOUR COVID TEST IS COMPLETED, PLEASE BEGIN THE QUARANTINE INSTRUCTIONS AS OUTLINED IN YOUR HANDOUT.                Samuel Owens    Your procedure is scheduled on: 02-07-2019  Report to [facility] Main  Entrance  Report to RADIOLOGY at 1000 AM      Call this number if you have problems the morning of surgery [phone]    Remember: Do not eat food or drink liquids :After Midnight. BRUSH YOUR TEETH MORNING OF SURGERY AND RINSE YOUR MOUTH OUT, NO CHEWING GUM CANDY OR MINTS.     Take these medicines the morning of surgery with A SIP OF WATER: NONE                                You may not have any metal on your body including hair pins and              piercings  Do not wear jewelry, make-up, lotions, powders or perfumes, deodorant                          Men may shave face and neck.   Do not bring valuables to the hospital. [place].  Contacts, dentures or bridgework may not be worn into surgery.  Leave suitcase in the car. After surgery it may be brought to your room.      _____________________________________________________________________             [facility] - Preparing for Surgery Before surgery, you can play an important role.  Because skin is not sterile, your skin needs to be as free of germs as possible.  You can reduce the number of germs on your skin by washing with CHG (chlorahexidine gluconate) soap before surgery.  CHG is an antiseptic cleaner which kills germs and bonds with the skin to continue killing germs even after washing. Please DO NOT use if you have an allergy to CHG or antibacterial soaps.  If your skin becomes reddened/irritated stop using the CHG and inform your nurse when you arrive at Short Stay. Do not shave (including legs  and underarms) for at least 48 hours prior to the first CHG shower.  You may shave your face/neck. Please follow these instructions carefully:  1.  Shower with CHG Soap the night before surgery and the  morning of Surgery.  2.  If you choose to wash your hair, wash your hair first as usual with your  normal  shampoo.  3.  After you shampoo, rinse your hair and body thoroughly to remove the  shampoo.                           4.  Use CHG as you would any other liquid soap.  You can apply chg directly  to the skin and wash                       Gently with a scrungie or clean washcloth.  5.  Apply the  CHG Soap to your body ONLY FROM THE NECK DOWN.   Do not use on face/ open                           Wound or open sores. Avoid contact with eyes, ears mouth and genitals (private parts).                       Wash face,  Genitals (private parts) with your normal soap.             6.  Wash thoroughly, paying special attention to the area where your surgery  will be performed.  7.  Thoroughly rinse your body with warm water from the neck down.  8.  DO NOT shower/wash with your normal soap after using and rinsing off  the CHG Soap.                9.  Pat yourself dry with a clean towel.            10.  Wear clean pajamas.            11.  Place clean sheets on your bed the night of your first shower and do not  sleep with pets. Day of Surgery : Do not apply any lotions/deodorants the morning of surgery.  Please wear clean clothes to the hospital/surgery center.  FAILURE TO FOLLOW THESE INSTRUCTIONS MAY RESULT IN THE CANCELLATION OF YOUR SURGERY PATIENT SIGNATURE_________________________________  NURSE SIGNATURE__________________________________  ________________________________________________________________________

## 2019-02-02 NOTE — Progress Notes (Signed)
ECHO 02-08-16 EPIC

## 2019-02-04 ENCOUNTER — Encounter (HOSPITAL_COMMUNITY)
Admission: RE | Admit: 2019-02-04 | Discharge: 2019-02-04 | Disposition: A | Discharge status: Home / Self Care | Payer: Medicaid Other | Source: Ambulatory Visit | Attending: Urology | Admitting: Urology

## 2019-02-04 ENCOUNTER — Other Ambulatory Visit: Payer: Self-pay | Admitting: Radiology

## 2019-02-04 ENCOUNTER — Other Ambulatory Visit (HOSPITAL_COMMUNITY)
Admission: RE | Admit: 2019-02-04 | Discharge: 2019-02-04 | Disposition: A | Discharge status: Home / Self Care | Payer: Medicaid Other | Source: Ambulatory Visit | Attending: Urology | Admitting: Urology

## 2019-02-04 ENCOUNTER — Other Ambulatory Visit: Payer: Self-pay

## 2019-02-04 ENCOUNTER — Encounter (HOSPITAL_COMMUNITY): Payer: Self-pay

## 2019-02-04 DIAGNOSIS — Z1159 Encounter for screening for other viral diseases: Secondary | ICD-10-CM | POA: Diagnosis not present

## 2019-02-04 DIAGNOSIS — N200 Calculus of kidney: Secondary | ICD-10-CM | POA: Diagnosis not present

## 2019-02-04 DIAGNOSIS — N210 Calculus in bladder: Secondary | ICD-10-CM | POA: Insufficient documentation

## 2019-02-04 DIAGNOSIS — Z01812 Encounter for preprocedural laboratory examination: Secondary | ICD-10-CM | POA: Diagnosis not present

## 2019-02-04 LAB — CBC
HCT: 47.7 % (ref 39.0–52.0)
Hemoglobin: 15.6 g/dL (ref 13.0–17.0)
MCH: 30.8 pg (ref 26.0–34.0)
MCHC: 32.7 g/dL (ref 30.0–36.0)
MCV: 94.3 fL (ref 80.0–100.0)
Platelets: 247 10*3/uL (ref 150–400)
RBC: 5.06 MIL/uL (ref 4.22–5.81)
RDW: 13.2 % (ref 11.5–15.5)
WBC: 10.6 10*3/uL — ABNORMAL HIGH (ref 4.0–10.5)
nRBC: 0 % (ref 0.0–0.2)

## 2019-02-04 LAB — BASIC METABOLIC PANEL
Anion gap: 9 (ref 5–15)
BUN: 19 mg/dL (ref 6–20)
CO2: 25 mmol/L (ref 22–32)
Calcium: 8.9 mg/dL (ref 8.9–10.3)
Chloride: 106 mmol/L (ref 98–111)
Creatinine, Ser: 1.36 mg/dL — ABNORMAL HIGH (ref 0.61–1.24)
GFR calc Af Amer: >60 mL/min (ref >60)
GFR calc non Af Amer: >60 mL/min (ref >60)
Glucose, Bld: 98 mg/dL (ref 70–99)
Potassium: 3.7 mmol/L (ref 3.5–5.1)
Sodium: 140 mmol/L (ref 135–145)

## 2019-02-04 LAB — SARS CORONAVIRUS 2 (TAT 6-24 HRS): SARS Coronavirus 2: NEGATIVE

## 2019-02-07 ENCOUNTER — Observation Stay (HOSPITAL_COMMUNITY)
Admission: RE | Admit: 2019-02-07 | Discharge: 2019-02-07 | Discharge status: Against Medical Advice | Payer: Medicaid Other | Source: Ambulatory Visit | Attending: Urology | Admitting: Urology

## 2019-02-07 ENCOUNTER — Ambulatory Visit (HOSPITAL_COMMUNITY)
Admission: RE | Admit: 2019-02-07 | Discharge: 2019-02-07 | Disposition: A | Discharge status: Home / Self Care | Payer: Medicaid Other | Source: Ambulatory Visit | Attending: Urology | Admitting: Urology

## 2019-02-07 ENCOUNTER — Ambulatory Visit (HOSPITAL_COMMUNITY): Payer: Medicaid Other | Admitting: Anesthesiology

## 2019-02-07 ENCOUNTER — Encounter (HOSPITAL_COMMUNITY): Payer: Self-pay | Admitting: Interventional Radiology

## 2019-02-07 ENCOUNTER — Encounter (HOSPITAL_COMMUNITY)
Admission: RE | Discharge status: Against Medical Advice | Payer: Self-pay | Source: Ambulatory Visit | Attending: Urology

## 2019-02-07 ENCOUNTER — Ambulatory Visit (HOSPITAL_COMMUNITY): Payer: Medicaid Other

## 2019-02-07 ENCOUNTER — Other Ambulatory Visit: Payer: Self-pay

## 2019-02-07 DIAGNOSIS — N200 Calculus of kidney: Secondary | ICD-10-CM

## 2019-02-07 DIAGNOSIS — Z1159 Encounter for screening for other viral diseases: Secondary | ICD-10-CM | POA: Insufficient documentation

## 2019-02-07 DIAGNOSIS — N210 Calculus in bladder: Secondary | ICD-10-CM | POA: Diagnosis not present

## 2019-02-07 DIAGNOSIS — Z960 Presence of urogenital implants: Secondary | ICD-10-CM | POA: Insufficient documentation

## 2019-02-07 DIAGNOSIS — Z86718 Personal history of other venous thrombosis and embolism: Secondary | ICD-10-CM | POA: Diagnosis not present

## 2019-02-07 DIAGNOSIS — F17210 Nicotine dependence, cigarettes, uncomplicated: Secondary | ICD-10-CM | POA: Insufficient documentation

## 2019-02-07 DIAGNOSIS — Z79899 Other long term (current) drug therapy: Secondary | ICD-10-CM | POA: Diagnosis not present

## 2019-02-07 DIAGNOSIS — Z7901 Long term (current) use of anticoagulants: Secondary | ICD-10-CM | POA: Diagnosis not present

## 2019-02-07 HISTORY — PX: NEPHROLITHOTOMY: SHX5134

## 2019-02-07 HISTORY — PX: IR URETERAL STENT LEFT NEW ACCESS W/O SEP NEPHROSTOMY CATH: IMG6075

## 2019-02-07 HISTORY — PX: CYSTOSCOPY WITH LITHOLAPAXY: SHX1425

## 2019-02-07 LAB — CBC WITH DIFFERENTIAL/PLATELET
Abs Immature Granulocytes: 0.04 10*3/uL (ref 0.00–0.07)
Basophils Absolute: 0 10*3/uL (ref 0.0–0.1)
Basophils Relative: 0 %
Eosinophils Absolute: 0.5 10*3/uL (ref 0.0–0.5)
Eosinophils Relative: 5 %
HCT: 43.9 % (ref 39.0–52.0)
Hemoglobin: 14.5 g/dL (ref 13.0–17.0)
Immature Granulocytes: 0 %
Lymphocytes Relative: 21 %
Lymphs Abs: 2 10*3/uL (ref 0.7–4.0)
MCH: 30.9 pg (ref 26.0–34.0)
MCHC: 33 g/dL (ref 30.0–36.0)
MCV: 93.6 fL (ref 80.0–100.0)
Monocytes Absolute: 0.7 10*3/uL (ref 0.1–1.0)
Monocytes Relative: 8 %
Neutro Abs: 6.2 10*3/uL (ref 1.7–7.7)
Neutrophils Relative %: 66 %
Platelets: 244 10*3/uL (ref 150–400)
RBC: 4.69 MIL/uL (ref 4.22–5.81)
RDW: 13.2 % (ref 11.5–15.5)
WBC: 9.5 10*3/uL (ref 4.0–10.5)
nRBC: 0 % (ref 0.0–0.2)

## 2019-02-07 LAB — BASIC METABOLIC PANEL
Anion gap: 7 (ref 5–15)
Anion gap: 8 (ref 5–15)
BUN: 14 mg/dL (ref 6–20)
BUN: 14 mg/dL (ref 6–20)
CO2: 23 mmol/L (ref 22–32)
CO2: 25 mmol/L (ref 22–32)
Calcium: 8.5 mg/dL — ABNORMAL LOW (ref 8.9–10.3)
Calcium: 8.5 mg/dL — ABNORMAL LOW (ref 8.9–10.3)
Chloride: 109 mmol/L (ref 98–111)
Chloride: 106 mmol/L (ref 98–111)
Creatinine, Ser: 1.15 mg/dL (ref 0.61–1.24)
Creatinine, Ser: 1.48 mg/dL — ABNORMAL HIGH (ref 0.61–1.24)
GFR calc Af Amer: >60 mL/min (ref >60)
GFR calc Af Amer: >60 mL/min (ref >60)
GFR calc non Af Amer: >60 mL/min (ref >60)
GFR calc non Af Amer: >60 mL/min (ref >60)
Glucose, Bld: 100 mg/dL — ABNORMAL HIGH (ref 70–99)
Glucose, Bld: 108 mg/dL — ABNORMAL HIGH (ref 70–99)
Potassium: 4 mmol/L (ref 3.5–5.1)
Potassium: 3.9 mmol/L (ref 3.5–5.1)
Sodium: 139 mmol/L (ref 135–145)
Sodium: 139 mmol/L (ref 135–145)

## 2019-02-07 LAB — CBC
HCT: 47.2 % (ref 39.0–52.0)
Hemoglobin: 14.9 g/dL (ref 13.0–17.0)
MCH: 30.8 pg (ref 26.0–34.0)
MCHC: 31.6 g/dL (ref 30.0–36.0)
MCV: 97.5 fL (ref 80.0–100.0)
Platelets: 255 10*3/uL (ref 150–400)
RBC: 4.84 MIL/uL (ref 4.22–5.81)
RDW: 13.3 % (ref 11.5–15.5)
WBC: 15.3 10*3/uL — ABNORMAL HIGH (ref 4.0–10.5)
nRBC: 0 % (ref 0.0–0.2)

## 2019-02-07 LAB — PROTIME-INR
INR: 1 (ref 0.8–1.2)
Prothrombin Time: 13.5 seconds (ref 11.4–15.2)

## 2019-02-07 SURGERY — CYSTOSCOPY, WITH BLADDER CALCULUS LITHOLAPAXY
Anesthesia: General | Laterality: Right

## 2019-02-07 MED ORDER — LACTATED RINGERS IV SOLN
INTRAVENOUS | Status: DC | PRN
  Administered 2019-02-07: 14:00:00 via INTRAVENOUS

## 2019-02-07 MED ORDER — HYDROMORPHONE HCL 1 MG/ML IJ SOLN
0.5000 mg | INTRAMUSCULAR | Status: DC | PRN
  Administered 2019-02-07: 1 mg via INTRAVENOUS
  Filled 2019-02-07: qty 1

## 2019-02-07 MED ORDER — SODIUM CHLORIDE 0.9 % IV SOLN
INTRAVENOUS | Status: DC
  Administered 2019-02-07: 11:00:00 via INTRAVENOUS

## 2019-02-07 MED ORDER — MIDAZOLAM HCL 2 MG/2ML IJ SOLN
INTRAMUSCULAR | Status: AC | PRN
  Administered 2019-02-07: 1 mg via INTRAVENOUS
  Administered 2019-02-07: 2 mg via INTRAVENOUS
  Administered 2019-02-07: 1 mg via INTRAVENOUS

## 2019-02-07 MED ORDER — DEXAMETHASONE SODIUM PHOSPHATE 10 MG/ML IJ SOLN
INTRAMUSCULAR | Status: DC | PRN
  Administered 2019-02-07: 10 mg via INTRAVENOUS

## 2019-02-07 MED ORDER — MIDAZOLAM HCL 2 MG/2ML IJ SOLN
INTRAMUSCULAR | Status: AC
  Filled 2019-02-07: qty 6

## 2019-02-07 MED ORDER — EPHEDRINE 5 MG/ML INJ
INTRAVENOUS | Status: AC
  Filled 2019-02-07: qty 10

## 2019-02-07 MED ORDER — IOHEXOL 300 MG/ML  SOLN
50.0000 mL | Freq: Once | INTRAMUSCULAR | Status: AC | PRN
  Administered 2019-02-07: 20 mL

## 2019-02-07 MED ORDER — ACETAMINOPHEN 325 MG PO TABS: 650.0000 mg | ORAL_TABLET | ORAL | Status: DC | PRN

## 2019-02-07 MED ORDER — FENTANYL CITRATE (PF) 100 MCG/2ML IJ SOLN
INTRAMUSCULAR | Status: AC
  Filled 2019-02-07: qty 2

## 2019-02-07 MED ORDER — PHENYLEPHRINE 40 MCG/ML (10ML) SYRINGE FOR IV PUSH (FOR BLOOD PRESSURE SUPPORT)
PREFILLED_SYRINGE | INTRAVENOUS | Status: AC
  Filled 2019-02-07: qty 20

## 2019-02-07 MED ORDER — PHENYLEPHRINE 40 MCG/ML (10ML) SYRINGE FOR IV PUSH (FOR BLOOD PRESSURE SUPPORT)
PREFILLED_SYRINGE | INTRAVENOUS | Status: DC | PRN
  Administered 2019-02-07: 40 ug via INTRAVENOUS

## 2019-02-07 MED ORDER — LIDOCAINE 2% (20 MG/ML) 5 ML SYRINGE
INTRAMUSCULAR | Status: DC | PRN
  Administered 2019-02-07: 80 mg via INTRAVENOUS

## 2019-02-07 MED ORDER — ONDANSETRON HCL 4 MG/2ML IJ SOLN
INTRAMUSCULAR | Status: DC | PRN
  Administered 2019-02-07: 4 mg via INTRAVENOUS

## 2019-02-07 MED ORDER — ROCURONIUM BROMIDE 100 MG/10ML IV SOLN
INTRAVENOUS | Status: DC | PRN
  Administered 2019-02-07 (×2): 20 mg via INTRAVENOUS
  Administered 2019-02-07: 50 mg via INTRAVENOUS
  Administered 2019-02-07: 20 mg via INTRAVENOUS

## 2019-02-07 MED ORDER — LIDOCAINE HCL 1 % IJ SOLN
INTRAMUSCULAR | Status: AC
  Administered 2019-02-07: 18:00:00
  Filled 2019-02-07: qty 20

## 2019-02-07 MED ORDER — DIPHENHYDRAMINE HCL 12.5 MG/5ML PO ELIX: 12.5000 mg | ORAL_SOLUTION | Freq: Four times a day (QID) | ORAL | Status: DC | PRN

## 2019-02-07 MED ORDER — FENTANYL CITRATE (PF) 100 MCG/2ML IJ SOLN
INTRAMUSCULAR | Status: AC
  Filled 2019-02-07: qty 4

## 2019-02-07 MED ORDER — ROCURONIUM BROMIDE 10 MG/ML (PF) SYRINGE
PREFILLED_SYRINGE | INTRAVENOUS | Status: AC
  Filled 2019-02-07: qty 10

## 2019-02-07 MED ORDER — SODIUM CHLORIDE 0.9 % IV SOLN
2.0000 g | Freq: Once | INTRAVENOUS | Status: AC
  Administered 2019-02-07: 2 g via INTRAVENOUS
  Filled 2019-02-07: qty 2

## 2019-02-07 MED ORDER — DIPHENHYDRAMINE HCL 50 MG/ML IJ SOLN: 12.5000 mg | Freq: Four times a day (QID) | INTRAMUSCULAR | Status: DC | PRN

## 2019-02-07 MED ORDER — PROPOFOL 10 MG/ML IV BOLUS
INTRAVENOUS | Status: DC | PRN
  Administered 2019-02-07: 50 mg via INTRAVENOUS
  Administered 2019-02-07: 200 mg via INTRAVENOUS

## 2019-02-07 MED ORDER — PROPOFOL 10 MG/ML IV BOLUS
INTRAVENOUS | Status: AC
  Filled 2019-02-07: qty 20

## 2019-02-07 MED ORDER — OXYCODONE-ACETAMINOPHEN 5-325 MG PO TABS: 1.0000 | ORAL_TABLET | ORAL | Status: DC | PRN

## 2019-02-07 MED ORDER — SODIUM CHLORIDE 0.9 % IR SOLN
Status: DC | PRN
  Administered 2019-02-07: 9000 mL
  Administered 2019-02-07: 6000 mL
  Administered 2019-02-07: 3000 mL

## 2019-02-07 MED ORDER — ACETAMINOPHEN 500 MG PO TABS
1000.0000 mg | ORAL_TABLET | Freq: Once | ORAL | Status: AC
  Administered 2019-02-07: 1000 mg via ORAL
  Filled 2019-02-07: qty 2

## 2019-02-07 MED ORDER — SODIUM CHLORIDE 0.9 % IV SOLN: 2.0000 g | INTRAVENOUS | Status: DC

## 2019-02-07 MED ORDER — 0.9 % SODIUM CHLORIDE (POUR BTL) OPTIME
TOPICAL | Status: DC | PRN
  Administered 2019-02-07: 1000 mL

## 2019-02-07 MED ORDER — BELLADONNA ALKALOIDS-OPIUM 16.2-60 MG RE SUPP: 1.0000 | Freq: Four times a day (QID) | RECTAL | Status: DC | PRN

## 2019-02-07 MED ORDER — FENTANYL CITRATE (PF) 100 MCG/2ML IJ SOLN
INTRAMUSCULAR | Status: AC | PRN
  Administered 2019-02-07 (×3): 50 ug via INTRAVENOUS

## 2019-02-07 MED ORDER — ZOLPIDEM TARTRATE 5 MG PO TABS: 5.0000 mg | ORAL_TABLET | Freq: Every evening | ORAL | Status: DC | PRN

## 2019-02-07 MED ORDER — IOHEXOL 300 MG/ML  SOLN
INTRAMUSCULAR | Status: DC | PRN
  Administered 2019-02-07: 15 mL

## 2019-02-07 MED ORDER — FENTANYL CITRATE (PF) 100 MCG/2ML IJ SOLN: 25.0000 ug | INTRAMUSCULAR | Status: DC | PRN

## 2019-02-07 MED ORDER — SUGAMMADEX SODIUM 200 MG/2ML IV SOLN
INTRAVENOUS | Status: DC | PRN
  Administered 2019-02-07: 200 mg via INTRAVENOUS

## 2019-02-07 MED ORDER — ONDANSETRON HCL 4 MG/2ML IJ SOLN: 4.0000 mg | INTRAMUSCULAR | Status: DC | PRN

## 2019-02-07 MED ORDER — MIDAZOLAM HCL 2 MG/2ML IJ SOLN
INTRAMUSCULAR | Status: AC
  Filled 2019-02-07: qty 2

## 2019-02-07 MED ORDER — LIDOCAINE HCL (PF) 1 % IJ SOLN
INTRAMUSCULAR | Status: AC | PRN
  Administered 2019-02-07: 10 mL

## 2019-02-07 MED ORDER — FENTANYL CITRATE (PF) 100 MCG/2ML IJ SOLN
INTRAMUSCULAR | Status: DC | PRN
  Administered 2019-02-07 (×6): 50 ug via INTRAVENOUS

## 2019-02-07 MED ORDER — SODIUM CHLORIDE 0.9 % IV SOLN
INTRAVENOUS | Status: DC
  Administered 2019-02-07: 18:00:00 via INTRAVENOUS

## 2019-02-07 SURGICAL SUPPLY — 70 items
APL SKNCLS STERI-STRIP NONHPOA (GAUZE/BANDAGES/DRESSINGS) ×2
BAG URINE DRAINAGE (UROLOGICAL SUPPLIES) ×8 IMPLANT
BAG URO CATCHER STRL LF (MISCELLANEOUS) ×4 IMPLANT
BASKET STONE NITINOL 3FRX115MB (UROLOGICAL SUPPLIES) IMPLANT
BASKET ZERO TIP NITINOL 2.4FR (BASKET) IMPLANT
BENZOIN TINCTURE PRP APPL 2/3 (GAUZE/BANDAGES/DRESSINGS) ×6 IMPLANT
BLADE SURG 15 STRL LF DISP TIS (BLADE) ×2 IMPLANT
BLADE SURG 15 STRL SS (BLADE) ×4
BSKT STON RTRVL ZERO TP 2.4FR (BASKET)
CATH FOLEY 2W COUNCIL 20FR 5CC (CATHETERS) ×2 IMPLANT
CATH FOLEY 2WAY SLVR  5CC 16FR (CATHETERS) ×2
CATH FOLEY 2WAY SLVR  5CC 18FR (CATHETERS)
CATH FOLEY 2WAY SLVR 5CC 16FR (CATHETERS) ×2 IMPLANT
CATH FOLEY 2WAY SLVR 5CC 18FR (CATHETERS) ×2 IMPLANT
CATH IMAGER II 65CM (CATHETERS) ×2 IMPLANT
CATH INTERMIT  6FR 70CM (CATHETERS) ×2 IMPLANT
CATH ROBINSON RED A/P 20FR (CATHETERS) IMPLANT
CATH URET DUAL LUMEN 6-10FR 50 (CATHETERS) IMPLANT
CATH X-FORCE N30 NEPHROSTOMY (TUBING) ×4 IMPLANT
CLOTH BEACON ORANGE TIMEOUT ST (SAFETY) ×4 IMPLANT
COVER SURGICAL LIGHT HANDLE (MISCELLANEOUS) ×4 IMPLANT
COVER WAND RF STERILE (DRAPES) IMPLANT
DRAPE C-ARM 42X120 X-RAY (DRAPES) ×4 IMPLANT
DRAPE LINGEMAN PERC (DRAPES) ×4 IMPLANT
DRAPE SHEET LG 3/4 BI-LAMINATE (DRAPES) ×2 IMPLANT
DRAPE SURG IRRIG POUCH 19X23 (DRAPES) ×4 IMPLANT
DRSG PAD ABDOMINAL 8X10 ST (GAUZE/BANDAGES/DRESSINGS) ×6 IMPLANT
DRSG TEGADERM 8X12 (GAUZE/BANDAGES/DRESSINGS) ×6 IMPLANT
FIBER LASER FLEXIVA 1000 (UROLOGICAL SUPPLIES) ×4 IMPLANT
FIBER LASER FLEXIVA 550 (UROLOGICAL SUPPLIES) ×2 IMPLANT
FIBER LASER TRAC TIP (UROLOGICAL SUPPLIES) IMPLANT
GAUZE SPONGE 4X4 12PLY STRL (GAUZE/BANDAGES/DRESSINGS) ×4 IMPLANT
GLOVE BIO SURGEON STRL SZ8 (GLOVE) ×10 IMPLANT
GLOVE BIOGEL M 8.0 STRL (GLOVE) ×6 IMPLANT
GLOVE BIOGEL PI IND STRL 8 (GLOVE) IMPLANT
GLOVE BIOGEL PI INDICATOR 8 (GLOVE)
GOWN STRL REUS W/TWL LRG LVL3 (GOWN DISPOSABLE) ×8 IMPLANT
GOWN STRL REUS W/TWL XL LVL3 (GOWN DISPOSABLE) ×2 IMPLANT
GUIDEWIRE AMPLAZ .035X145 (WIRE) ×8 IMPLANT
GUIDEWIRE STR DUAL SENSOR (WIRE) ×6 IMPLANT
IV SET EXTENSION CATH 6 NF (IV SETS) ×2 IMPLANT
KIT BASIN OR (CUSTOM PROCEDURE TRAY) ×4 IMPLANT
KIT PROBE 340X3.4XDISP GRN (MISCELLANEOUS) IMPLANT
KIT PROBE TRILOGY 3.4X340 (MISCELLANEOUS)
KIT PROBE TRILOGY 3.9X350 (MISCELLANEOUS) ×2 IMPLANT
KIT TURNOVER KIT A (KITS) IMPLANT
MANIFOLD NEPTUNE II (INSTRUMENTS) ×4 IMPLANT
NDL TROCAR 18X15 ECHO (NEEDLE) IMPLANT
NDL TROCAR 18X20 (NEEDLE) IMPLANT
NEEDLE TROCAR 18X15 ECHO (NEEDLE) IMPLANT
NEEDLE TROCAR 18X20 (NEEDLE) IMPLANT
NS IRRIG 1000ML POUR BTL (IV SOLUTION) ×4 IMPLANT
PACK CYSTO (CUSTOM PROCEDURE TRAY) ×4 IMPLANT
SET AMPLATZ RENAL DILATOR (MISCELLANEOUS) IMPLANT
SET IRRIG Y TYPE TUR BLADDER L (SET/KITS/TRAYS/PACK) ×2 IMPLANT
SHEATH PEELAWAY SET 9 (SHEATH) ×2 IMPLANT
SPONGE LAP 4X18 RFD (DISPOSABLE) ×4 IMPLANT
STENT URET 6FRX26 CONTOUR (STENTS) ×2 IMPLANT
SUT SILK 2 0 30  PSL (SUTURE) ×2
SUT SILK 2 0 30 PSL (SUTURE) ×2 IMPLANT
SYR 10ML LL (SYRINGE) ×2 IMPLANT
SYR 20CC LL (SYRINGE) ×4 IMPLANT
SYR 50ML LL SCALE MARK (SYRINGE) ×2 IMPLANT
SYRINGE IRR TOOMEY STRL 70CC (SYRINGE) IMPLANT
TOWEL OR 17X26 10 PK STRL BLUE (TOWEL DISPOSABLE) ×4 IMPLANT
TUBING CONNECTING 10 (TUBING) ×7 IMPLANT
TUBING CONNECTING 10' (TUBING) ×3
TUBING INSUFFLATION 10FT LAP (TUBING) IMPLANT
TUBING UROLOGY SET (TUBING) ×4 IMPLANT
WATER STERILE IRR 1000ML POUR (IV SOLUTION) ×2 IMPLANT

## 2019-02-07 NOTE — Sedation Documentation (Signed)
Patient is resting comfortably with eyes closed, snoring lightly. VSS. Patient in NAD.

## 2019-02-07 NOTE — Progress Notes (Signed)
At shift change during report patient was irritable saying he had not received food and he was supposed to be discharged after surgery, states "I've been here 12 hours and for what, I have food at home." RN offered patient a frozen dinner or soup or [place] sandwich, patient refused food asking to go home. RN explained to him that he has not been discharged and he has orders to be observed in the hospital overnight. Day shift RN paged Dr. Alyson Ingles twice prior to 1900 with no response. After getting report this RN paged the provider on call. Jiles Crocker returned the page and advised the patient to stay the night per plan in Dr. Noland Fordyce note, and said if the patient still wanted to leave AMA to page Dr. Lovena Neighbours the MD on call for tonight. AC was called to bedside per patient request. Dr. Lovena Neighbours returned the page and said that the plan is clear from Dr. Noland Fordyce note and he was not going to deviate from the plan, if the patient insists on leaving then he can leave AMA with foley catheter and nephrostomy tube in place and follow up in the office but it is not advised by the doctor to do this. When [facility] arrived we explained the options to stay per the plan and be observed over night or he can leave AMA with both tubes in place. Patient adamant he wanted to leave. Nephrostomy tube and foley catheter leaking, dressing over nephrostomy tube was reinforced with 2 ABDs and tape. Provided patient with 4 leg bags, 2 large foley bags, and dressing supplies. Asked where his discharge paperwork was, explained to the patient that leaving AMA means you are not discharged therefore, no paperwork is provided, told his he needs to follow up with [facility] outpatient off of [address]. Per patient he understands his risk and understands how to use leg bags as well as foley bags. AMA paper signed and patient was wheeled out to the main entrance and per his request left on the bench.

## 2019-02-07 NOTE — Discharge Instructions (Signed)
Percutaneous Nephrostomy, Care After °This sheet gives you information about how to care for yourself after your procedure. Your health care provider may also give you more specific instructions. If you have problems or questions, contact your health care provider. °What can I expect after the procedure? °After the procedure, it is common to have: °· Some soreness where the nephrostomy tube was inserted (tube insertion site). °· Blood-tinged drainage from the nephrostomy tube for the first 24 hours. °Follow these instructions at home: °Activity °· Return to your normal activities as told by your health care provider. Ask your health care provider what activities are safe for you. °· Avoid activities that may cause the nephrostomy tubing to bend. °· Do not take baths, swim, or use a hot tub until your health care provider approves. Ask your health care provider if you can take showers. Cover the nephrostomy tube dressing with a watertight covering when you take a shower. °· Do not drive for 24 hours if you were given a medicine to help you relax (sedative). °Care of the tube insertion site ° °· Follow instructions from your health care provider about how to take care of your tube insertion site. Make sure you: °? Wash your hands with soap and water before you change your bandage (dressing). If soap and water are not available, use hand sanitizer. °? Change your dressing as told by your health care provider. Be careful not to pull on the tube while removing the dressing. °? When you change the dressing, wash the skin around the tube, rinse well, and pat the skin dry. °· Check the tube insertion area every day for signs of infection. Check for: °? More redness, swelling, or pain. °? More fluid or blood. °? Warmth. °? Pus or a bad smell. °Care of the nephrostomy tube and drainage bag °· Always keep the tubing, the leg bag, or the bedside drainage bags below the level of the kidney so that your urine drains  freely. °· When connecting your nephrostomy tube to a drainage bag, make sure that there are no kinks in the tubing and that your urine is draining freely. You may want to use an elastic bandage to wrap any exposed tubing that goes from the nephrostomy tube to any of the connecting tubes. °· At night, you may want to connect your nephrostomy tube or the leg bag to a larger bedside drainage bag. °· Follow instructions from your health care provider about how to empty or change the drainage bag. °· Empty the drainage bag when it becomes ? full. °· Replace the drainage bag and any extension tubing that is connected to your nephrostomy tube every 3 weeks or as often as told by your health care provider. Your health care provider will explain how to change the drainage bag and extension tubing. °General instructions °· Take over-the-counter and prescription medicines only as told by your health care provider. °· Keep all follow-up visits as told by your health care provider. This is important. °Contact a health care provider if: °· You have problems with any of the valves or tubing. °· You have persistent pain or soreness in your back. °· You have more redness, swelling, or pain around your tube insertion site. °· You have more fluid or blood coming from your tube insertion site. °· Your tube insertion site feels warm to the touch. °· You have pus or a bad smell coming from your tube insertion site. °· You have increased urine output or you feel   burning when urinating. °Get help right away if: °· You have pain in your abdomen during the first week. °· You have chest pain or have trouble breathing. °· You have a new appearance of blood in your urine. °· You have a fever or chills. °· You have back pain that is not relieved by your medicine. °· You have decreased urine output. °· Your nephrostomy tube comes out. °This information is not intended to replace advice given to you by your health care provider. Make sure you  discuss any questions you have with your health care provider. °Document Released: 02/28/2004 Document Revised: 06/19/2017 Document Reviewed: 04/18/2016 °Elsevier Patient Education © 2020 [employer] ° °

## 2019-02-07 NOTE — Anesthesia Postprocedure Evaluation (Signed)
Anesthesia Post Note  Patient: Page Lancon  Procedure(s) Performed: CYSTOSCOPY WITH LITHOLAPAXY (N/A ) NEPHROLITHOTOMY PERCUTANEOUS (Right )     Patient location during evaluation: PACU Anesthesia Type: General Level of consciousness: awake and alert Pain management: pain level controlled Vital Signs Assessment: post-procedure vital signs reviewed and stable Respiratory status: spontaneous breathing, nonlabored ventilation, respiratory function stable and patient connected to nasal cannula oxygen Cardiovascular status: blood pressure returned to baseline and stable Postop Assessment: no apparent nausea or vomiting Anesthetic complications: no    Last Vitals:  Vitals:   02/07/19 1630 02/07/19 1645  BP: (!) 151/105 (!) 150/111  Pulse: 64 (!) 59  Resp: 12 13  Temp: (!) 36.4 C   SpO2: 95% 95%    Last Pain:  Vitals:   02/07/19 1630  TempSrc:   PainSc: Asleep                 Hollister Wessler L Samvel Zinn

## 2019-02-07 NOTE — Plan of Care (Signed)

## 2019-02-07 NOTE — Op Note (Signed)
Preoperative diagnosis: retained ureteral stent, bladder calculus, renal calculus  Postoperative diagnosis: Same  Procedure 1.  Right percutaneous nephrostolithotomy for stone greater than 2 cm 2.  Right nephrostogram 3.  Intraoperative fluoroscopy, under 1 hour, with interpretation 4.  Placement of a 6 x 26 double-J ureteral stent. 5.  Placement of a 20 French nephrostomy tube 6.  Dilation of percutaneous tract 7. Cystolithalopaxy for a stone greater than 2.5cm  Attending: Nicolette Bang, MD  Anesthesia: General  Estimated blood loss: 100cc  Antibiotics: Rocephin  Drains: 1.  16 French Foley catheter 2.  6 x 26 right double-J ureteral stent 3.  20 French nephrostomy tube  Specimens: Stone for analysis  Findings: 4cm bladder calculus, 3cm renal pelvis calculus. Successful removal of the retained right ureteral stent. Limited drainage of contrast down the ureter prior to removing UPJ calculus. Minimal extravasation following PCNL  Indications: Patient is a 23 year old male with a history of large right renal stone and bladder stone on a retained right ureteral stent.  After discussing treatment options they decided with cystolithalopaxy and right percutaneous nephrostolithotomy.  Patient already has a nephrostomy tube.  Procedure in detail: Prior to procedure consent was obtained.  Patient was brought to the operating room and a time out was done to ensure correct patient, correct procedure, and correct site.  General anesthesia was administered and the patient was placed in dorsal lithotomy position.  Their genitalia was then prepped and draped in usual sterile fashion.  A rigid 5 French cystoscope was passed in the urethra and the bladder.  Bladder was inspected and we noted no masses or lesions.  the ureteral orifices were in the normal orthotopic locations. Using the 1000nm laser fiber the bladder calculus was fragmented and the fragments were then irrigated from the bladder. The  stone fragments were the sent for analysis.  The scope was removed and a 16 French Foley catheter was in place.  The patient was then placed in the prone position.  His nephrostomy tube and right flank was then prepped and draped in usual sterile fashion.  A nephrostogram was obtained and findings noted above.  Through the nephrostomy tube we then placed a sensor wire.  Sensor wire was coiled in the renal pelvis we then removed the nephrostomy tube.  We then made an incision at the level of the skin and over the wire we then placed a NephroMax dilator.  We dilated the nephrostomy tract to 30 [place] and held this 18 cm of water for 1 minute.  We then  placed the access sheath over the balloon.  The balloon was then deflated.  We then used a rigid nephroscope to perform nephroscopy.  We encountered numerous large renal pelvis calculi and large UPJ calculus.  We then used a lithoclast to fragment the stone in multiple pieces.  Using the graspers we removed stone fragments and sent for composition analysis.  Once the majority of the stone was removed we then were able to perform nephroscopy with a flexible nephroscope.   We then placed a second wire through the ureteroscope into the bladder.  We then removed the nephroscope and over the wire placed a 6 x 26 double-J ureteral stent.  The wire was then removed and good coil was noted in the renal pelvis under direct vision in the bladder under fluoroscopy.  We then placed a 20 French nephrostomy tube through the sheath into the renal pelvis.  The balloon was inflated with 3 amounts of contrast.  We  then removed the access sheath in and obtain another nephrostogram.  We noted minimal extravasation of contrast.  We then secured the nephrostomy tubes with 0 silks in interrupted fashion.  Dressing was placed over the nephrostomy tube site and this then concluded the procedure was well-tolerated by the patient.  Complications: None  Condition: Stable, extubated,  transferred to PACU  Plan: Patient is to be admitted overnight for observation.  His Foley catheter through the morning. Nephrostomy tube will be removed in 2 days. He is then to be discharged home and followup in 1 week for stent removal

## 2019-02-07 NOTE — Anesthesia Preprocedure Evaluation (Addendum)
Anesthesia Evaluation  Patient identified by MRN, date of birth, ID band Patient awake    Reviewed: Allergy & Precautions, NPO status , Patient's Chart, lab work & pertinent test results  Airway Mallampati: III  TM Distance: >3 FB Neck ROM: Full  Mouth opening: Limited Mouth Opening  Dental no notable dental hx. (+)    Pulmonary neg pulmonary ROS, Current Smoker,    Pulmonary exam normal breath sounds clear to auscultation       Cardiovascular + DVT  Normal cardiovascular exam Rhythm:Regular Rate:Normal  TTE 2017 - Normal LV systolic function; grade 1 diastolic dysfunction.   Neuro/Psych negative psych ROS   GI/Hepatic negative GI ROS, Neg liver ROS,   Endo/Other  negative endocrine ROS  Renal/GU Renal diseaseRenal stone  negative genitourinary   Musculoskeletal negative musculoskeletal ROS (+)   Abdominal   Peds  Hematology negative hematology ROS (+)   Anesthesia Other Findings   Reproductive/Obstetrics                            Anesthesia Physical Anesthesia Plan  ASA: II  Anesthesia Plan: General   Post-op Pain Management:    Induction: Intravenous  PONV Risk Score and Plan: 1 and Ondansetron, Dexamethasone and Midazolam  Airway Management Planned: Oral ETT  Additional Equipment:   Intra-op Plan:   Post-operative Plan: Extubation in OR  Informed Consent: I have reviewed the patients History and Physical, chart, labs and discussed the procedure including the risks, benefits and alternatives for the proposed anesthesia with the patient or authorized representative who has indicated his/her understanding and acceptance.     Dental advisory given  Plan Discussed with: CRNA  Anesthesia Plan Comments:         Anesthesia Quick Evaluation

## 2019-02-07 NOTE — Transfer of Care (Signed)
Immediate Anesthesia Transfer of Care Note  Patient: Samuel Owens  Procedure(s) Performed: CYSTOSCOPY WITH LITHOLAPAXY (N/A ) NEPHROLITHOTOMY PERCUTANEOUS (Right )  Patient Location: PACU  Anesthesia Type:General  Level of Consciousness: sedated  Airway & Oxygen Therapy: Patient Spontanous Breathing and Patient connected to face mask oxygen  Post-op Assessment: Report given to RN and Post -op Vital signs reviewed and stable  Post vital signs: Reviewed and stable  Last Vitals:  Vitals Value Taken Time  BP 169/95 02/07/19 1602  Temp    Pulse 89 02/07/19 1604  Resp 18 02/07/19 1604  SpO2 100 % 02/07/19 1604  Vitals shown include unvalidated device data.  Last Pain:  Vitals:   02/07/19 1011  TempSrc: Oral         Complications: No apparent anesthesia complications
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
## 2019-02-07 NOTE — Anesthesia Procedure Notes (Signed)
Procedure Name: Intubation Date/Time: 02/07/2019 1:27 PM Performed by: Pilar Grammes, CRNA Pre-anesthesia Checklist: Patient identified, Emergency Drugs available, Suction available, Patient being monitored and Timeout performed Patient Re-evaluated:Patient Re-evaluated prior to induction Oxygen Delivery Method: Circle system utilized Preoxygenation: Pre-oxygenation with 100% oxygen Induction Type: IV induction Ventilation: Mask ventilation without difficulty Laryngoscope Size: Miller and 2 Grade View: Grade I Tube type: Oral Tube size: 7.0 mm Number of attempts: 1 Airway Equipment and Method: Stylet Placement Confirmation: positive ETCO2,  ETT inserted through vocal cords under direct vision,  CO2 detector and breath sounds checked- equal and bilateral Secured at: 22 cm Tube secured with: Tape Dental Injury: Teeth and Oropharynx as per pre-operative assessment
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
## 2019-02-08 ENCOUNTER — Emergency Department (HOSPITAL_COMMUNITY)
Admission: EM | Admit: 2019-02-08 | Discharge: 2019-02-08 | Disposition: A | Discharge status: Hospice | Payer: Medicaid Other | Attending: Emergency Medicine | Admitting: Emergency Medicine

## 2019-02-08 ENCOUNTER — Encounter (HOSPITAL_COMMUNITY): Payer: Self-pay | Admitting: Urology

## 2019-02-08 ENCOUNTER — Other Ambulatory Visit: Payer: Self-pay

## 2019-02-08 DIAGNOSIS — F1290 Cannabis use, unspecified, uncomplicated: Secondary | ICD-10-CM | POA: Insufficient documentation

## 2019-02-08 DIAGNOSIS — F17210 Nicotine dependence, cigarettes, uncomplicated: Secondary | ICD-10-CM | POA: Insufficient documentation

## 2019-02-08 DIAGNOSIS — Z86718 Personal history of other venous thrombosis and embolism: Secondary | ICD-10-CM | POA: Insufficient documentation

## 2019-02-08 DIAGNOSIS — Z79899 Other long term (current) drug therapy: Secondary | ICD-10-CM | POA: Insufficient documentation

## 2019-02-08 DIAGNOSIS — G8918 Other acute postprocedural pain: Secondary | ICD-10-CM | POA: Insufficient documentation

## 2019-02-08 DIAGNOSIS — Z466 Encounter for fitting and adjustment of urinary device: Secondary | ICD-10-CM | POA: Insufficient documentation

## 2019-02-08 DIAGNOSIS — R1031 Right lower quadrant pain: Secondary | ICD-10-CM | POA: Diagnosis present

## 2019-02-08 DIAGNOSIS — Z936 Other artificial openings of urinary tract status: Secondary | ICD-10-CM | POA: Diagnosis not present

## 2019-02-08 MED ORDER — ACETAMINOPHEN 500 MG PO TABS
1000.0000 mg | ORAL_TABLET | Freq: Once | ORAL | Status: AC
  Administered 2019-02-08: 1000 mg via ORAL
  Filled 2019-02-08: qty 2

## 2019-02-08 NOTE — Discharge Instructions (Addendum)
Dr. Alyson Ingles would like to to follow-up in the office on 7/23.  Please call the office today to confirm the time and do the proper intake.  Nephrostomy (flank) tube will likely be removed in the office.  Foley was removed today.  For pain take 1000 mg of acetaminophen (Tylenol) every 6 hours.  Stay well-hydrated until your urine is clear.

## 2019-02-08 NOTE — ED Triage Notes (Signed)
Patient c/o right back pain. Patient states he had a stent replaced to the right kidney yesterday and was an inpatient and left AMA last night. Patient has a nephrostomy tube present.
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
## 2019-02-09 ENCOUNTER — Encounter (HOSPITAL_COMMUNITY): Payer: Self-pay | Admitting: Emergency Medicine

## 2019-02-09 ENCOUNTER — Emergency Department (HOSPITAL_COMMUNITY)
Admission: EM | Admit: 2019-02-09 | Discharge: 2019-02-09 | Disposition: A | Discharge status: Home / Self Care | Payer: Medicaid Other | Attending: Emergency Medicine | Admitting: Emergency Medicine

## 2019-02-09 ENCOUNTER — Other Ambulatory Visit: Payer: Self-pay

## 2019-02-09 DIAGNOSIS — Z7901 Long term (current) use of anticoagulants: Secondary | ICD-10-CM | POA: Diagnosis not present

## 2019-02-09 DIAGNOSIS — F17210 Nicotine dependence, cigarettes, uncomplicated: Secondary | ICD-10-CM | POA: Insufficient documentation

## 2019-02-09 DIAGNOSIS — G8918 Other acute postprocedural pain: Secondary | ICD-10-CM | POA: Diagnosis present

## 2019-02-09 DIAGNOSIS — Z960 Presence of urogenital implants: Secondary | ICD-10-CM | POA: Diagnosis not present

## 2019-02-09 MED ORDER — KETOROLAC TROMETHAMINE 60 MG/2ML IM SOLN
60.0000 mg | Freq: Once | INTRAMUSCULAR | Status: AC
  Administered 2019-02-09: 60 mg via INTRAMUSCULAR
  Filled 2019-02-09: qty 2

## 2019-02-09 MED ORDER — OXYCODONE-ACETAMINOPHEN 5-325 MG PO TABS: 1.0000 | ORAL_TABLET | ORAL | 0 refills | Status: DC | PRN

## 2019-02-09 MED ORDER — OXYCODONE-ACETAMINOPHEN 5-325 MG PO TABS
2.0000 | ORAL_TABLET | Freq: Once | ORAL | Status: AC
  Administered 2019-02-09: 2 via ORAL
  Filled 2019-02-09: qty 2
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
## 2019-02-09 NOTE — ED Triage Notes (Signed)
Pt reports having renal stent placed with nephrostomy tube placed to R flank. Pt reports he is having severe pain to back. Pt tried tylenol and other OTC meds with no relief. Pt reports he is here for pain relief.

## 2019-02-09 NOTE — Discharge Instructions (Addendum)
You may want to call the urologist office tomorrow as it seems as though there is an appointment for you tomorrow on the 23rd.  I would call them first thing in the morning to chat with them about your appointment.  Return here as needed.

## 2019-02-24 LAB — CALCULI, WITH PHOTOGRAPH (CLINICAL LAB)
Ammonium Acid Urate Calculi: 30 %
Carbonate Apatite: 10 %
Mg NH4 PO4 (Struvite): 60 %
Weight Calculi: 5662 mg

## 2019-02-28 NOTE — Addendum Note (Signed)
Encounter addended by: Ernestene Mention, RN on: 02/28/2019 11:11 AM  Actions taken: Charge Capture section accepted

## 2019-03-28 ENCOUNTER — Emergency Department (HOSPITAL_COMMUNITY)
Admission: EM | Admit: 2019-03-28 | Discharge: 2019-03-28 | Disposition: A | Discharge status: Home / Self Care | Payer: Medicaid Other | Attending: Emergency Medicine | Admitting: Emergency Medicine

## 2019-03-28 ENCOUNTER — Encounter (HOSPITAL_COMMUNITY): Payer: Self-pay | Admitting: Emergency Medicine

## 2019-03-28 DIAGNOSIS — F17210 Nicotine dependence, cigarettes, uncomplicated: Secondary | ICD-10-CM | POA: Insufficient documentation

## 2019-03-28 DIAGNOSIS — M545 Low back pain: Secondary | ICD-10-CM | POA: Diagnosis not present

## 2019-03-28 DIAGNOSIS — R369 Urethral discharge, unspecified: Secondary | ICD-10-CM | POA: Diagnosis present

## 2019-03-28 DIAGNOSIS — Z960 Presence of urogenital implants: Secondary | ICD-10-CM | POA: Insufficient documentation

## 2019-03-28 LAB — URINALYSIS, ROUTINE W REFLEX MICROSCOPIC
Bilirubin Urine: NEGATIVE
Glucose, UA: NEGATIVE mg/dL
Ketones, ur: NEGATIVE mg/dL
Nitrite: NEGATIVE
Protein, ur: 30 mg/dL — AB
Specific Gravity, Urine: 1.016 (ref 1.005–1.030)
WBC, UA: >50 WBC/hpf — ABNORMAL HIGH (ref 0–5)
pH: 6 (ref 5.0–8.0)

## 2019-03-28 MED ORDER — AZITHROMYCIN 250 MG PO TABS
1000.0000 mg | ORAL_TABLET | Freq: Once | ORAL | Status: AC
  Administered 2019-03-28: 1000 mg via ORAL
  Filled 2019-03-28: qty 4

## 2019-03-28 MED ORDER — CEFTRIAXONE SODIUM 250 MG IJ SOLR
250.0000 mg | Freq: Once | INTRAMUSCULAR | Status: AC
  Administered 2019-03-28: 250 mg via INTRAMUSCULAR
  Filled 2019-03-28: qty 250

## 2019-03-28 NOTE — Discharge Instructions (Addendum)
Please read attached information. If you experience any new or worsening signs or symptoms please return to the emergency room for evaluation. Please follow-up with your primary care provider or specialist as discussed. Please use medication prescribed only as directed and discontinue taking if you have any concerning signs or symptoms.   °
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
## 2019-03-28 NOTE — ED Triage Notes (Signed)
Pt states 2 days ago he started having white discharge from penis . Pt also reports having a stent put in one month ago due to kidney stone.

## 2019-03-30 LAB — GC/CHLAMYDIA PROBE AMP (~~LOC~~) NOT AT ARMC
Chlamydia: NEGATIVE
Neisseria Gonorrhea: POSITIVE — AB

## 2019-08-20 ENCOUNTER — Encounter (HOSPITAL_COMMUNITY): Payer: Self-pay | Admitting: Emergency Medicine

## 2019-08-20 ENCOUNTER — Emergency Department (HOSPITAL_COMMUNITY): Payer: Medicaid Other

## 2019-08-20 ENCOUNTER — Other Ambulatory Visit: Payer: Self-pay

## 2019-08-20 ENCOUNTER — Emergency Department (HOSPITAL_COMMUNITY)
Admission: EM | Admit: 2019-08-20 | Discharge: 2019-08-20 | Disposition: A | Discharge status: Home / Self Care | Payer: Medicaid Other | Attending: Emergency Medicine | Admitting: Emergency Medicine

## 2019-08-20 DIAGNOSIS — Z86718 Personal history of other venous thrombosis and embolism: Secondary | ICD-10-CM | POA: Diagnosis not present

## 2019-08-20 DIAGNOSIS — N390 Urinary tract infection, site not specified: Secondary | ICD-10-CM | POA: Insufficient documentation

## 2019-08-20 DIAGNOSIS — I10 Essential (primary) hypertension: Secondary | ICD-10-CM | POA: Insufficient documentation

## 2019-08-20 DIAGNOSIS — Z7901 Long term (current) use of anticoagulants: Secondary | ICD-10-CM | POA: Diagnosis not present

## 2019-08-20 DIAGNOSIS — Z79899 Other long term (current) drug therapy: Secondary | ICD-10-CM | POA: Insufficient documentation

## 2019-08-20 DIAGNOSIS — F17210 Nicotine dependence, cigarettes, uncomplicated: Secondary | ICD-10-CM | POA: Insufficient documentation

## 2019-08-20 DIAGNOSIS — F1210 Cannabis abuse, uncomplicated: Secondary | ICD-10-CM | POA: Insufficient documentation

## 2019-08-20 DIAGNOSIS — R109 Unspecified abdominal pain: Secondary | ICD-10-CM | POA: Diagnosis present

## 2019-08-20 LAB — CBC WITH DIFFERENTIAL/PLATELET
Abs Immature Granulocytes: 0.01 10*3/uL (ref 0.00–0.07)
Basophils Absolute: 0 10*3/uL (ref 0.0–0.1)
Basophils Relative: 0 %
Eosinophils Absolute: 0.1 10*3/uL (ref 0.0–0.5)
Eosinophils Relative: 1 %
HCT: 49.8 % (ref 39.0–52.0)
Hemoglobin: 16.4 g/dL (ref 13.0–17.0)
Immature Granulocytes: 0 %
Lymphocytes Relative: 33 %
Lymphs Abs: 2 10*3/uL (ref 0.7–4.0)
MCH: 30 pg (ref 26.0–34.0)
MCHC: 32.9 g/dL (ref 30.0–36.0)
MCV: 91 fL (ref 80.0–100.0)
Monocytes Absolute: 0.6 10*3/uL (ref 0.1–1.0)
Monocytes Relative: 10 %
Neutro Abs: 3.4 10*3/uL (ref 1.7–7.7)
Neutrophils Relative %: 56 %
Platelets: 210 10*3/uL (ref 150–400)
RBC: 5.47 MIL/uL (ref 4.22–5.81)
RDW: 13.5 % (ref 11.5–15.5)
WBC: 6.1 10*3/uL (ref 4.0–10.5)
nRBC: 0 % (ref 0.0–0.2)

## 2019-08-20 LAB — COMPREHENSIVE METABOLIC PANEL
ALT: 21 U/L (ref 0–44)
AST: 23 U/L (ref 15–41)
Albumin: 3.8 g/dL (ref 3.5–5.0)
Alkaline Phosphatase: 65 U/L (ref 38–126)
Anion gap: 10 (ref 5–15)
BUN: 11 mg/dL (ref 6–20)
CO2: 24 mmol/L (ref 22–32)
Calcium: 8.9 mg/dL (ref 8.9–10.3)
Chloride: 106 mmol/L (ref 98–111)
Creatinine, Ser: 1.33 mg/dL — ABNORMAL HIGH (ref 0.61–1.24)
GFR calc Af Amer: >60 mL/min (ref >60)
GFR calc non Af Amer: >60 mL/min (ref >60)
Glucose, Bld: 86 mg/dL (ref 70–99)
Potassium: 3.6 mmol/L (ref 3.5–5.1)
Sodium: 140 mmol/L (ref 135–145)
Total Bilirubin: 1 mg/dL (ref 0.3–1.2)
Total Protein: 7.2 g/dL (ref 6.5–8.1)

## 2019-08-20 LAB — URINALYSIS, COMPLETE (UACMP) WITH MICROSCOPIC
Bacteria, UA: NONE SEEN
Bilirubin Urine: NEGATIVE
Glucose, UA: NEGATIVE mg/dL
Ketones, ur: NEGATIVE mg/dL
Nitrite: NEGATIVE
Protein, ur: 100 mg/dL — AB
RBC / HPF: >50 RBC/hpf — ABNORMAL HIGH (ref 0–5)
Specific Gravity, Urine: 1.021 (ref 1.005–1.030)
WBC, UA: >50 WBC/hpf — ABNORMAL HIGH (ref 0–5)
pH: 6 (ref 5.0–8.0)

## 2019-08-20 LAB — LACTIC ACID, PLASMA: Lactic Acid, Venous: 1.1 mmol/L (ref 0.5–1.9)

## 2019-08-20 MED ORDER — HYDROCODONE-ACETAMINOPHEN 5-325 MG PO TABS: 1.0000 | ORAL_TABLET | ORAL | 0 refills | Status: DC | PRN

## 2019-08-20 MED ORDER — SODIUM CHLORIDE 0.9 % IV SOLN
2.0000 g | Freq: Once | INTRAVENOUS | Status: AC
  Administered 2019-08-20: 19:00:00 2 g via INTRAVENOUS
  Filled 2019-08-20: qty 20

## 2019-08-20 MED ORDER — TAMSULOSIN HCL 0.4 MG PO CAPS: 0.4000 mg | ORAL_CAPSULE | Freq: Every day | ORAL | 0 refills | Status: AC

## 2019-08-20 MED ORDER — CEPHALEXIN 500 MG PO CAPS: 500.0000 mg | ORAL_CAPSULE | Freq: Four times a day (QID) | ORAL | 0 refills | Status: AC

## 2019-08-20 MED ORDER — SODIUM CHLORIDE 0.9 % IV BOLUS
1000.0000 mL | Freq: Once | INTRAVENOUS | Status: AC
  Administered 2019-08-20: 1000 mL via INTRAVENOUS

## 2019-08-20 MED ORDER — MORPHINE SULFATE (PF) 4 MG/ML IV SOLN
4.0000 mg | Freq: Once | INTRAVENOUS | Status: AC
  Administered 2019-08-20: 4 mg via INTRAVENOUS
  Filled 2019-08-20: qty 1

## 2019-08-20 NOTE — ED Notes (Signed)
Pt discharge and prescription education provided. Pt verbalizes understanding. Pt is alert and oriented x 4 and ambulatory at discharge.  
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
## 2019-08-20 NOTE — ED Notes (Signed)
Patient back from CT.

## 2019-08-20 NOTE — ED Notes (Signed)
Pt. States that his pain is slowly getting bettter

## 2019-08-20 NOTE — ED Notes (Signed)
Patient transported to CT 

## 2019-08-20 NOTE — ED Notes (Signed)
Patient is resting comfortably. 

## 2019-08-20 NOTE — Discharge Instructions (Addendum)
While in the ED your blood pressure was high.  Please follow up with your primary care doctor or the wellness clinic for repeat evaluation as you may need medication.  High blood pressure can cause long term, potentially serious, damage if left untreated.   Today you received medications that may make you sleepy or impair your ability to make decisions.  For the next 24 hours please do not drive, operate heavy machinery, care for a small child with out another adult present, or perform any activities that may cause harm to you or someone else if you were to fall asleep or be impaired.   You are being prescribed a medication which may make you sleepy. Please follow up of listed precautions for at least 24 hours after taking one dose.  You may have diarrhea from the antibiotics.  It is very important that you continue to take the antibiotics even if you get diarrhea unless a medical professional tells you that you may stop taking them.  If you stop too early the bacteria you are being treated for will become stronger and you may need different, more powerful antibiotics that have more side effects and worsening diarrhea.  Please stay well hydrated and consider probiotics as they may decrease the severity of your diarrhea.

## 2019-08-20 NOTE — ED Triage Notes (Signed)
Patient c/o right sided flank pain x 1.5 week. Reports hx of same. C/o dark and cloudy urine.

## 2019-08-22 LAB — URINE CULTURE

## 2019-08-22 LAB — GC/CHLAMYDIA PROBE AMP (~~LOC~~) NOT AT ARMC
Chlamydia: NEGATIVE
Neisseria Gonorrhea: NEGATIVE

## 2019-10-25 ENCOUNTER — Other Ambulatory Visit: Payer: Self-pay

## 2019-10-25 ENCOUNTER — Encounter (HOSPITAL_COMMUNITY): Payer: Self-pay | Admitting: Emergency Medicine

## 2019-10-25 ENCOUNTER — Emergency Department (HOSPITAL_COMMUNITY)
Admission: EM | Admit: 2019-10-25 | Discharge: 2019-10-26 | Disposition: A | Discharge status: Home / Self Care | Payer: Medicaid Other | Attending: Emergency Medicine | Admitting: Emergency Medicine

## 2019-10-25 DIAGNOSIS — Z5321 Procedure and treatment not carried out due to patient leaving prior to being seen by health care provider: Secondary | ICD-10-CM | POA: Insufficient documentation

## 2019-10-25 DIAGNOSIS — R1031 Right lower quadrant pain: Secondary | ICD-10-CM | POA: Diagnosis not present

## 2019-10-25 LAB — CBC WITH DIFFERENTIAL/PLATELET
Abs Immature Granulocytes: 0.03 10*3/uL (ref 0.00–0.07)
Basophils Absolute: 0 10*3/uL (ref 0.0–0.1)
Basophils Relative: 0 %
Eosinophils Absolute: 0.2 10*3/uL (ref 0.0–0.5)
Eosinophils Relative: 2 %
HCT: 44.1 % (ref 39.0–52.0)
Hemoglobin: 14.3 g/dL (ref 13.0–17.0)
Immature Granulocytes: 0 %
Lymphocytes Relative: 31 %
Lymphs Abs: 2.6 10*3/uL (ref 0.7–4.0)
MCH: 30 pg (ref 26.0–34.0)
MCHC: 32.4 g/dL (ref 30.0–36.0)
MCV: 92.6 fL (ref 80.0–100.0)
Monocytes Absolute: 0.7 10*3/uL (ref 0.1–1.0)
Monocytes Relative: 9 %
Neutro Abs: 4.9 10*3/uL (ref 1.7–7.7)
Neutrophils Relative %: 58 %
Platelets: 219 10*3/uL (ref 150–400)
RBC: 4.76 MIL/uL (ref 4.22–5.81)
RDW: 14.1 % (ref 11.5–15.5)
WBC: 8.3 10*3/uL (ref 4.0–10.5)
nRBC: 0 % (ref 0.0–0.2)

## 2019-10-25 LAB — URINALYSIS, ROUTINE W REFLEX MICROSCOPIC
Bilirubin Urine: NEGATIVE
Glucose, UA: NEGATIVE mg/dL
Ketones, ur: NEGATIVE mg/dL
Nitrite: NEGATIVE
Protein, ur: 100 mg/dL — AB
RBC / HPF: >50 RBC/hpf — ABNORMAL HIGH (ref 0–5)
Specific Gravity, Urine: 1.021 (ref 1.005–1.030)
WBC, UA: >50 WBC/hpf — ABNORMAL HIGH (ref 0–5)
pH: 6 (ref 5.0–8.0)

## 2019-10-25 NOTE — ED Triage Notes (Signed)
Pt c/o right side flank pain for more than a week.

## 2019-10-26 ENCOUNTER — Emergency Department (HOSPITAL_COMMUNITY)
Admission: EM | Admit: 2019-10-26 | Discharge: 2019-10-26 | Discharge status: Against Medical Advice | Payer: Medicaid Other | Source: Home / Self Care

## 2019-10-26 ENCOUNTER — Encounter (HOSPITAL_COMMUNITY): Payer: Self-pay | Admitting: Emergency Medicine

## 2019-10-26 ENCOUNTER — Other Ambulatory Visit: Payer: Self-pay

## 2019-10-26 LAB — COMPREHENSIVE METABOLIC PANEL
ALT: 18 U/L (ref 0–44)
AST: 21 U/L (ref 15–41)
Albumin: 3.2 g/dL — ABNORMAL LOW (ref 3.5–5.0)
Alkaline Phosphatase: 55 U/L (ref 38–126)
Anion gap: 6 (ref 5–15)
BUN: 14 mg/dL (ref 6–20)
CO2: 23 mmol/L (ref 22–32)
Calcium: 8.6 mg/dL — ABNORMAL LOW (ref 8.9–10.3)
Chloride: 111 mmol/L (ref 98–111)
Creatinine, Ser: 1.4 mg/dL — ABNORMAL HIGH (ref 0.61–1.24)
GFR calc Af Amer: >60 mL/min (ref >60)
GFR calc non Af Amer: >60 mL/min (ref >60)
Glucose, Bld: 101 mg/dL — ABNORMAL HIGH (ref 70–99)
Potassium: 4.1 mmol/L (ref 3.5–5.1)
Sodium: 140 mmol/L (ref 135–145)
Total Bilirubin: 0.4 mg/dL (ref 0.3–1.2)
Total Protein: 6.1 g/dL — ABNORMAL LOW (ref 6.5–8.1)

## 2019-10-26 NOTE — ED Notes (Signed)
Pt asking about wait times. Pt told that there wasn't an accurate estimate to tell him. Pt seen leaving lobby. No response when called for vitals.

## 2019-10-26 NOTE — ED Triage Notes (Signed)
Patient arrives ambulatory c/o right sided flank pain along with lower abdominal pain x a few days. Patient states he came last night and had labs drawn but left due to wait time.

## 2019-10-26 NOTE — ED Notes (Signed)
Pt did not answer to vs after being called 4 times

## 2019-11-01 ENCOUNTER — Ambulatory Visit
Admission: EM | Admit: 2019-11-01 | Discharge: 2019-11-01 | Disposition: A | Discharge status: Home / Self Care | Payer: Medicaid Other | Attending: Physician Assistant | Admitting: Physician Assistant

## 2019-11-01 DIAGNOSIS — N3090 Cystitis, unspecified without hematuria: Secondary | ICD-10-CM

## 2019-11-01 DIAGNOSIS — R109 Unspecified abdominal pain: Secondary | ICD-10-CM | POA: Diagnosis present

## 2019-11-01 DIAGNOSIS — B9689 Other specified bacterial agents as the cause of diseases classified elsewhere: Secondary | ICD-10-CM

## 2019-11-01 DIAGNOSIS — K4090 Unilateral inguinal hernia, without obstruction or gangrene, not specified as recurrent: Secondary | ICD-10-CM | POA: Diagnosis not present

## 2019-11-01 LAB — POCT URINALYSIS DIP (MANUAL ENTRY)
Bilirubin, UA: NEGATIVE
Glucose, UA: NEGATIVE mg/dL
Ketones, POC UA: NEGATIVE mg/dL
Nitrite, UA: NEGATIVE
Protein Ur, POC: 30 mg/dL — AB
Spec Grav, UA: >=1.03 — AB (ref 1.010–1.025)
Urobilinogen, UA: 0.2 E.U./dL
pH, UA: 6 (ref 5.0–8.0)

## 2019-11-01 MED ORDER — HYDROCODONE-ACETAMINOPHEN 5-325 MG PO TABS: 1.0000 | ORAL_TABLET | Freq: Four times a day (QID) | ORAL | 0 refills | Status: DC | PRN

## 2019-11-01 MED ORDER — CEPHALEXIN 500 MG PO CAPS: 500.0000 mg | ORAL_CAPSULE | Freq: Three times a day (TID) | ORAL | 0 refills | Status: AC
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
## 2019-11-01 NOTE — Discharge Instructions (Signed)
Start Keflex as directed, I have extended the course to cover for a kidney infection due to your history and your symptoms right now.  Norco as needed for pain. Keep hydrated, urine should be clear to pale yellow in color.  Please follow-up with urology as soon as possible for reevaluation and further management needed.  If any worsening of symptoms, unable to urinate, fever, nausea/vomiting, weakness, dizziness, go to the emergency department for further evaluation.  No emergencies for your hernia right now.  I have attached general surgery information for you to follow-up.  If you are unable to reduce your hernia, no bowel movement/passing gas, nausea/vomiting, significant pain to the hernia, go to the emergency department for further evaluation.
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
## 2019-11-02 LAB — URINE CULTURE: Culture: NO GROWTH

## 2019-12-02 ENCOUNTER — Encounter (HOSPITAL_COMMUNITY): Payer: Self-pay

## 2019-12-02 ENCOUNTER — Other Ambulatory Visit: Payer: Self-pay

## 2019-12-02 ENCOUNTER — Emergency Department (HOSPITAL_COMMUNITY)
Admission: EM | Admit: 2019-12-02 | Discharge: 2019-12-02 | Disposition: A | Discharge status: Home / Self Care | Payer: Medicaid Other | Attending: Emergency Medicine | Admitting: Emergency Medicine

## 2019-12-02 DIAGNOSIS — S60512A Abrasion of left hand, initial encounter: Secondary | ICD-10-CM | POA: Diagnosis present

## 2019-12-02 DIAGNOSIS — Z5321 Procedure and treatment not carried out due to patient leaving prior to being seen by health care provider: Secondary | ICD-10-CM | POA: Insufficient documentation

## 2019-12-02 DIAGNOSIS — X58XXXA Exposure to other specified factors, initial encounter: Secondary | ICD-10-CM | POA: Insufficient documentation

## 2019-12-02 DIAGNOSIS — Y9389 Activity, other specified: Secondary | ICD-10-CM | POA: Diagnosis not present

## 2019-12-02 DIAGNOSIS — Y999 Unspecified external cause status: Secondary | ICD-10-CM | POA: Insufficient documentation

## 2019-12-02 DIAGNOSIS — Y929 Unspecified place or not applicable: Secondary | ICD-10-CM | POA: Diagnosis not present

## 2019-12-02 NOTE — ED Triage Notes (Signed)
Pt arrives to ED w/ abrasion to L hand after scraping it on the ground. Pt reports 10/10 pain.

## 2019-12-02 NOTE — ED Notes (Signed)
Called for pt multiple times. No answer. 

## 2019-12-02 NOTE — ED Notes (Signed)
2nd attempt to call for pt unsuccessful. Pt LWBS

## 2020-01-11 ENCOUNTER — Other Ambulatory Visit: Payer: Self-pay | Admitting: Urology

## 2020-01-17 ENCOUNTER — Other Ambulatory Visit: Payer: Self-pay

## 2020-01-17 ENCOUNTER — Encounter (HOSPITAL_COMMUNITY): Payer: Self-pay

## 2020-01-17 ENCOUNTER — Emergency Department (HOSPITAL_COMMUNITY)
Admission: EM | Admit: 2020-01-17 | Discharge: 2020-01-17 | Discharge status: Against Medical Advice | Payer: Medicaid Other | Attending: Emergency Medicine | Admitting: Emergency Medicine

## 2020-01-17 ENCOUNTER — Encounter (HOSPITAL_COMMUNITY): Payer: Self-pay | Admitting: *Deleted

## 2020-01-17 ENCOUNTER — Emergency Department (HOSPITAL_COMMUNITY)
Admission: EM | Admit: 2020-01-17 | Discharge: 2020-01-17 | Disposition: A | Discharge status: Home / Self Care | Payer: Medicaid Other | Source: Home / Self Care | Attending: Emergency Medicine | Admitting: Emergency Medicine

## 2020-01-17 DIAGNOSIS — Z5321 Procedure and treatment not carried out due to patient leaving prior to being seen by health care provider: Secondary | ICD-10-CM | POA: Insufficient documentation

## 2020-01-17 DIAGNOSIS — R1031 Right lower quadrant pain: Secondary | ICD-10-CM | POA: Insufficient documentation

## 2020-01-17 DIAGNOSIS — N289 Disorder of kidney and ureter, unspecified: Secondary | ICD-10-CM | POA: Insufficient documentation

## 2020-01-17 NOTE — ED Notes (Signed)
No answer from pt when called for room x 1.  

## 2020-01-17 NOTE — ED Notes (Signed)
Pt not in the [facility] ED any longer, pt in [place] ED, will be removed from [facility] system.
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
## 2020-01-17 NOTE — ED Notes (Signed)
No response from pt when called from lobby x 3.
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
## 2020-01-26 ENCOUNTER — Other Ambulatory Visit: Payer: Self-pay

## 2020-01-26 ENCOUNTER — Encounter (HOSPITAL_BASED_OUTPATIENT_CLINIC_OR_DEPARTMENT_OTHER): Payer: Self-pay | Admitting: Urology

## 2020-01-26 NOTE — Progress Notes (Signed)
NEW Covid Policy July 2021  Surgery Day:  02-03-20  Facility:  [facility]  Type of Surgery: cystoscopy  Fully Covid Vaccinated:  no Not Covid Vaccinated:  covid test 02-01-20 at 1235 pm  Do you have symptoms? no  In the past 14 days:        Have you had any symptoms?no       Have you been tested covid positive?no       Have you been in contact with someone covid positive?no        Is pt Immuno-compromised?no

## 2020-01-26 NOTE — Progress Notes (Signed)
Spoke w/ via phone for pre-op interview---pt Lab needs dos---- none            COVID test ------02-01-20 at 1235 Arrive at -------1230 pm 01-24-20 No food after midnight clear liquids from midnight until 1130 am then npo Medications to take morning of surgery -----none Diabetic medication -----n/a Patient Special Instructions -----none Pre-Op special Istructions -----none Patient verbalized understanding of instructions that were given at this phone interview. Patient denies shortness of breath, chest pain, fever, cough a this phone interview.

## 2020-02-01 ENCOUNTER — Other Ambulatory Visit (HOSPITAL_COMMUNITY)
Admission: RE | Admit: 2020-02-01 | Discharge: 2020-02-01 | Disposition: A | Discharge status: Home / Self Care | Payer: Medicaid Other | Source: Ambulatory Visit | Attending: Urology | Admitting: Urology

## 2020-02-01 DIAGNOSIS — Z01812 Encounter for preprocedural laboratory examination: Secondary | ICD-10-CM | POA: Diagnosis not present

## 2020-02-01 DIAGNOSIS — Z20822 Contact with and (suspected) exposure to covid-19: Secondary | ICD-10-CM | POA: Diagnosis not present

## 2020-02-01 LAB — SARS CORONAVIRUS 2 (TAT 6-24 HRS): SARS Coronavirus 2: NEGATIVE

## 2020-02-03 ENCOUNTER — Ambulatory Visit (HOSPITAL_BASED_OUTPATIENT_CLINIC_OR_DEPARTMENT_OTHER)
Admission: RE | Admit: 2020-02-03 | Discharge: 2020-02-03 | Disposition: A | Discharge status: Home / Self Care | Payer: Medicaid Other | Attending: Urology | Admitting: Urology

## 2020-02-03 ENCOUNTER — Encounter (HOSPITAL_BASED_OUTPATIENT_CLINIC_OR_DEPARTMENT_OTHER)
Admission: RE | Disposition: A | Discharge status: Home / Self Care | Payer: Self-pay | Source: Home / Self Care | Attending: Urology

## 2020-02-03 ENCOUNTER — Encounter (HOSPITAL_BASED_OUTPATIENT_CLINIC_OR_DEPARTMENT_OTHER): Payer: Self-pay | Admitting: Urology

## 2020-02-03 ENCOUNTER — Ambulatory Visit (HOSPITAL_BASED_OUTPATIENT_CLINIC_OR_DEPARTMENT_OTHER): Payer: Medicaid Other | Admitting: Certified Registered"

## 2020-02-03 DIAGNOSIS — Z466 Encounter for fitting and adjustment of urinary device: Secondary | ICD-10-CM | POA: Diagnosis not present

## 2020-02-03 DIAGNOSIS — Z20822 Contact with and (suspected) exposure to covid-19: Secondary | ICD-10-CM | POA: Insufficient documentation

## 2020-02-03 DIAGNOSIS — F17210 Nicotine dependence, cigarettes, uncomplicated: Secondary | ICD-10-CM | POA: Diagnosis not present

## 2020-02-03 DIAGNOSIS — Z86718 Personal history of other venous thrombosis and embolism: Secondary | ICD-10-CM | POA: Insufficient documentation

## 2020-02-03 HISTORY — PX: HOLMIUM LASER APPLICATION: SHX5852

## 2020-02-03 HISTORY — DX: Personal history of other medical treatment: Z92.89

## 2020-02-03 HISTORY — PX: CYSTOSCOPY WITH RETROGRADE PYELOGRAM, URETEROSCOPY AND STENT PLACEMENT: SHX5789

## 2020-02-03 SURGERY — CYSTOURETEROSCOPY, WITH RETROGRADE PYELOGRAM AND STENT INSERTION
Anesthesia: General | Laterality: Right

## 2020-02-03 MED ORDER — DEXMEDETOMIDINE HCL IN NACL 400 MCG/100ML IV SOLN
INTRAVENOUS | Status: DC | PRN
  Administered 2020-02-03: 8 ug via INTRAVENOUS
  Administered 2020-02-03: 4 ug via INTRAVENOUS
  Administered 2020-02-03 (×2): 8 ug via INTRAVENOUS

## 2020-02-03 MED ORDER — MIDAZOLAM HCL 5 MG/5ML IJ SOLN
INTRAMUSCULAR | Status: DC | PRN
  Administered 2020-02-03: 2 mg via INTRAVENOUS

## 2020-02-03 MED ORDER — CEFAZOLIN SODIUM-DEXTROSE 2-4 GM/100ML-% IV SOLN
2.0000 g | INTRAVENOUS | Status: AC
  Administered 2020-02-03: 2 g via INTRAVENOUS

## 2020-02-03 MED ORDER — EPHEDRINE SULFATE-NACL 50-0.9 MG/10ML-% IV SOSY
PREFILLED_SYRINGE | INTRAVENOUS | Status: DC | PRN
  Administered 2020-02-03: 10 mg via INTRAVENOUS

## 2020-02-03 MED ORDER — PROPOFOL 10 MG/ML IV BOLUS
INTRAVENOUS | Status: DC | PRN
  Administered 2020-02-03: 200 mg via INTRAVENOUS

## 2020-02-03 MED ORDER — ACETAMINOPHEN 325 MG PO TABS: 325.0000 mg | ORAL_TABLET | ORAL | Status: DC | PRN

## 2020-02-03 MED ORDER — FENTANYL CITRATE (PF) 100 MCG/2ML IJ SOLN
INTRAMUSCULAR | Status: AC
  Filled 2020-02-03: qty 2

## 2020-02-03 MED ORDER — ONDANSETRON HCL 4 MG/2ML IJ SOLN
INTRAMUSCULAR | Status: AC
  Filled 2020-02-03: qty 2

## 2020-02-03 MED ORDER — KETOROLAC TROMETHAMINE 30 MG/ML IJ SOLN: 30.0000 mg | Freq: Once | INTRAMUSCULAR | Status: DC | PRN

## 2020-02-03 MED ORDER — LIDOCAINE 2% (20 MG/ML) 5 ML SYRINGE
INTRAMUSCULAR | Status: DC | PRN
  Administered 2020-02-03: 100 mg via INTRAVENOUS

## 2020-02-03 MED ORDER — PROPOFOL 10 MG/ML IV BOLUS
INTRAVENOUS | Status: AC
  Filled 2020-02-03: qty 20

## 2020-02-03 MED ORDER — OXYCODONE HCL 5 MG PO TABS: 5.0000 mg | ORAL_TABLET | Freq: Once | ORAL | Status: DC | PRN

## 2020-02-03 MED ORDER — ONDANSETRON HCL 4 MG/2ML IJ SOLN: 4.0000 mg | Freq: Once | INTRAMUSCULAR | Status: DC | PRN

## 2020-02-03 MED ORDER — MEPERIDINE HCL 25 MG/ML IJ SOLN: 6.2500 mg | INTRAMUSCULAR | Status: DC | PRN

## 2020-02-03 MED ORDER — FENTANYL CITRATE (PF) 100 MCG/2ML IJ SOLN: 25.0000 ug | INTRAMUSCULAR | Status: DC | PRN

## 2020-02-03 MED ORDER — CEFAZOLIN SODIUM-DEXTROSE 2-4 GM/100ML-% IV SOLN
INTRAVENOUS | Status: AC
  Filled 2020-02-03: qty 100

## 2020-02-03 MED ORDER — FENTANYL CITRATE (PF) 100 MCG/2ML IJ SOLN
INTRAMUSCULAR | Status: DC | PRN
  Administered 2020-02-03: 100 ug via INTRAVENOUS

## 2020-02-03 MED ORDER — MIDAZOLAM HCL 2 MG/2ML IJ SOLN
INTRAMUSCULAR | Status: AC
  Filled 2020-02-03: qty 2

## 2020-02-03 MED ORDER — IOHEXOL 300 MG/ML  SOLN
INTRAMUSCULAR | Status: DC | PRN
  Administered 2020-02-03: 10 mL via URETHRAL

## 2020-02-03 MED ORDER — LIDOCAINE 2% (20 MG/ML) 5 ML SYRINGE
INTRAMUSCULAR | Status: AC
  Filled 2020-02-03: qty 5

## 2020-02-03 MED ORDER — DEXAMETHASONE SODIUM PHOSPHATE 10 MG/ML IJ SOLN
INTRAMUSCULAR | Status: AC
  Filled 2020-02-03: qty 1

## 2020-02-03 MED ORDER — LACTATED RINGERS IV SOLN: INTRAVENOUS | Status: DC

## 2020-02-03 MED ORDER — OXYCODONE HCL 5 MG/5ML PO SOLN: 5.0000 mg | Freq: Once | ORAL | Status: DC | PRN

## 2020-02-03 MED ORDER — OXYCODONE-ACETAMINOPHEN 5-325 MG PO TABS: 2.0000 | ORAL_TABLET | ORAL | 0 refills | Status: DC | PRN

## 2020-02-03 MED ORDER — ACETAMINOPHEN 160 MG/5ML PO SOLN: 325.0000 mg | ORAL | Status: DC | PRN

## 2020-02-03 SURGICAL SUPPLY — 27 items
BAG DRAIN URO-CYSTO SKYTR STRL (DRAIN) ×3 IMPLANT
BAG DRN UROCATH (DRAIN) ×1
BASKET STONE 1.7 NGAGE (UROLOGICAL SUPPLIES) IMPLANT
CATH INTERMIT  6FR 70CM (CATHETERS) IMPLANT
CLOTH BEACON ORANGE TIMEOUT ST (SAFETY) ×3 IMPLANT
EVACUATOR MICROVAS BLADDER (UROLOGICAL SUPPLIES) IMPLANT
EXTRACTOR STONE 1.7FRX115CM (UROLOGICAL SUPPLIES) IMPLANT
FIBER LASER FLEXIVA 1000 (UROLOGICAL SUPPLIES) IMPLANT
FIBER LASER FLEXIVA 365 (UROLOGICAL SUPPLIES) IMPLANT
FIBER LASER FLEXIVA 550 (UROLOGICAL SUPPLIES) IMPLANT
FIBER LASER TRAC TIP (UROLOGICAL SUPPLIES) IMPLANT
GLOVE BIO SURGEON STRL SZ8 (GLOVE) ×3 IMPLANT
GOWN STRL REUS W/TWL XL LVL3 (GOWN DISPOSABLE) ×3 IMPLANT
GUIDEWIRE STR DUAL SENSOR (WIRE) IMPLANT
GUIDEWIRE ZIPWRE .038 STRAIGHT (WIRE) ×3 IMPLANT
IV NS 1000ML (IV SOLUTION) ×3
IV NS 1000ML BAXH (IV SOLUTION) ×1 IMPLANT
IV NS IRRIG 3000ML ARTHROMATIC (IV SOLUTION) ×3 IMPLANT
KIT TURNOVER CYSTO (KITS) ×3 IMPLANT
MANIFOLD NEPTUNE II (INSTRUMENTS) ×3 IMPLANT
NS IRRIG 500ML POUR BTL (IV SOLUTION) ×3 IMPLANT
PACK CYSTO (CUSTOM PROCEDURE TRAY) ×3 IMPLANT
STENT URET 6FRX26 CONTOUR (STENTS) IMPLANT
SYR 10ML LL (SYRINGE) ×3 IMPLANT
TUBE CONNECTING 12'X1/4 (SUCTIONS)
TUBE CONNECTING 12X1/4 (SUCTIONS) IMPLANT
TUBING UROLOGY SET (TUBING) ×3 IMPLANT

## 2020-02-03 NOTE — Transfer of Care (Signed)
Immediate Anesthesia Transfer of Care Note  Patient: Samuel Owens  Procedure(s) Performed: CYSTOSCOPY WITH RETROGRADE PYELOGRAM, URETEROSCOPY AND STENT REMOVAL (Right ) HOLMIUM LASER APPLICATION (Right )  Patient Location: PACU  Anesthesia Type:General  Level of Consciousness: awake, alert  and oriented  Airway & Oxygen Therapy: Patient Spontanous Breathing and Patient connected to nasal cannula oxygen  Post-op Assessment: Report given to RN and Post -op Vital signs reviewed and stable  Post vital signs: Reviewed and stable  Last Vitals:  Vitals Value Taken Time  BP    Temp    Pulse 56 02/03/20 1313  Resp 15 02/03/20 1313  SpO2 98 % 02/03/20 1313  Vitals shown include unvalidated device data.  Last Pain:  Vitals:   02/03/20 1129  TempSrc: Oral  PainSc: 8       Patients Stated Pain Goal: 3 (02/03/20 1129)  Complications: No complications documented.

## 2020-02-03 NOTE — Discharge Instructions (Signed)
Cystoscopy °Cystoscopy is a procedure that is used to help diagnose and sometimes treat conditions that affect the lower urinary tract. The lower urinary tract includes the bladder and the urethra. The urethra is the tube that drains urine from the bladder. Cystoscopy is done using a thin, tube-shaped instrument with a light and camera at the end (cystoscope). The cystoscope may be hard or flexible, depending on the goal of the procedure. The cystoscope is inserted through the urethra, into the bladder. °Cystoscopy may be recommended if you have: °· Urinary tract infections that keep coming back. °· Blood in the urine (hematuria). °· An inability to control when you urinate (urinary incontinence) or an overactive bladder. °· Unusual cells found in a urine sample. °· A blockage in the urethra, such as a urinary stone. °· Painful urination. °· An abnormality in the bladder found during an intravenous pyelogram (IVP) or CT scan. °Cystoscopy may also be done to remove a sample of tissue to be examined under a microscope (biopsy). °Tell a health care provider about: °· Any allergies you have. °· All medicines you are taking, including vitamins, herbs, eye drops, creams, and over-the-counter medicines. °· Any problems you or family members have had with anesthetic medicines. °· Any blood disorders you have. °· Any surgeries you have had. °· Any medical conditions you have. °· Whether you are pregnant or may be pregnant. °What are the risks? °Generally, this is a safe procedure. However, problems may occur, including: °· Infection. °· Bleeding. °· Allergic reactions to medicines. °· Damage to other structures or organs. °What happens before the procedure? °· Ask your health care provider about: °? Changing or stopping your regular medicines. This is especially important if you are taking diabetes medicines or blood thinners. °? Taking medicines such as aspirin and ibuprofen. These medicines can thin your blood. Do not take  these medicines unless your health care provider tells you to take them. °? Taking over-the-counter medicines, vitamins, herbs, and supplements. °· Follow instructions from your health care provider about eating or drinking restrictions. °· Ask your health care provider what steps will be taken to help prevent infection. These may include: °? Washing skin with a germ-killing soap. °? Taking antibiotic medicine. °· You may have an exam or testing, such as: °? X-rays of the bladder, urethra, or kidneys. °? Urine tests to check for signs of infection. °· Plan to have someone take you home from the hospital or clinic. °What happens during the procedure? ° °· You will be given one or more of the following: °? A medicine to help you relax (sedative). °? A medicine to numb the area (local anesthetic). °· The area around the opening of your urethra will be cleaned. °· The cystoscope will be passed through your urethra into your bladder. °· Germ-free (sterile) fluid will flow through the cystoscope to fill your bladder. The fluid will stretch your bladder so that your health care provider can clearly examine your bladder walls. °· Your doctor will look at the urethra and bladder. Your doctor may take a biopsy or remove stones. °· The cystoscope will be removed, and your bladder will be emptied. °The procedure may vary among health care providers and hospitals. °What can I expect after the procedure? °After the procedure, it is common to have: °· Some soreness or pain in your abdomen and urethra. °· Urinary symptoms. These include: °? Mild pain or burning when you urinate. Pain should stop within a few minutes after you urinate. This   may last for up to 1 week. °? A small amount of blood in your urine for several days. °? Feeling like you need to urinate but producing only a small amount of urine. °Follow these instructions at home: °Medicines °· Take over-the-counter and prescription medicines only as told by your health care  provider. °· If you were prescribed an antibiotic medicine, take it as told by your health care provider. Do not stop taking the antibiotic even if you start to feel better. °General instructions °· Return to your normal activities as told by your health care provider. Ask your health care provider what activities are safe for you. °· Do not drive for 24 hours if you were given a sedative during your procedure. °· Watch for any blood in your urine. If the amount of blood in your urine increases, call your health care provider. °· Follow instructions from your health care provider about eating or drinking restrictions. °· If a tissue sample was removed for testing (biopsy) during your procedure, it is up to you to get your test results. Ask your health care provider, or the department that is doing the test, when your results will be ready. °· Drink enough fluid to keep your urine pale yellow. °· Keep all follow-up visits as told by your health care provider. This is important. °Contact a health care provider if you: °· Have pain that gets worse or does not get better with medicine, especially pain when you urinate. °· Have trouble urinating. °· Have more blood in your urine. °Get help right away if you: °· Have blood clots in your urine. °· Have abdominal pain. °· Have a fever or chills. °· Are unable to urinate. °Summary °· Cystoscopy is a procedure that is used to help diagnose and sometimes treat conditions that affect the lower urinary tract. °· Cystoscopy is done using a thin, tube-shaped instrument with a light and camera at the end. °· After the procedure, it is common to have some soreness or pain in your abdomen and urethra. °· Watch for any blood in your urine. If the amount of blood in your urine increases, call your health care provider. °· If you were prescribed an antibiotic medicine, take it as told by your health care provider. Do not stop taking the antibiotic even if you start to feel better. °This  information is not intended to replace advice given to you by your health care provider. Make sure you discuss any questions you have with your health care provider. °Document Revised: 06/29/2018 Document Reviewed: 06/29/2018 °Elsevier Patient Education © 2020 [employer] ° ° ° ° °Post Anesthesia Home Care Instructions ° °Activity: °Get plenty of rest for the remainder of the day. A responsible individual must stay with you for 24 hours following the procedure.  °For the next 24 hours, DO NOT: °-Drive a car °-Operate machinery °-Drink alcoholic beverages °-Take any medication unless instructed by your physician °-Make any legal decisions or sign important papers. ° °Meals: °Start with liquid foods such as gelatin or soup. Progress to regular foods as tolerated. Avoid greasy, spicy, heavy foods. If nausea and/or vomiting occur, drink only clear liquids until the nausea and/or vomiting subsides. Call your physician if vomiting continues. ° °Special Instructions/Symptoms: °Your throat may feel dry or sore from the anesthesia or the breathing tube placed in your throat during surgery. If this causes discomfort, gargle with warm salt water. The discomfort should disappear within 24 hours. ° °If you had a scopolamine patch   placed behind your ear for the management of post- operative nausea and/or vomiting: ° °1. The medication in the patch is effective for 72 hours, after which it should be removed.  Wrap patch in a tissue and discard in the trash. Wash hands thoroughly with soap and water. °2. You may remove the patch earlier than 72 hours if you experience unpleasant side effects which may include dry mouth, dizziness or visual disturbances. °3. Avoid touching the patch. Wash your hands with soap and water after contact with the patch. °   ° ° ° °

## 2020-02-03 NOTE — Anesthesia Preprocedure Evaluation (Signed)
Anesthesia Evaluation  Patient identified by MRN, date of birth, ID band Patient awake    Reviewed: Allergy & Precautions, NPO status , Patient's Chart, lab work & pertinent test results  Airway Mallampati: III  TM Distance: >3 FB Neck ROM: Full  Mouth opening: Limited Mouth Opening  Dental no notable dental hx. (+)    Pulmonary neg pulmonary ROS, Current Smoker and Patient abstained from smoking.,    Pulmonary exam normal        Cardiovascular Normal cardiovascular exam  TTE 2017 - Normal LV systolic function; grade 1 diastolic dysfunction.   Neuro/Psych negative psych ROS   GI/Hepatic negative GI ROS, Neg liver ROS,   Endo/Other  negative endocrine ROS  Renal/GU Renal diseaseRenal stone  negative genitourinary   Musculoskeletal negative musculoskeletal ROS (+)   Abdominal Normal abdominal exam  (+)   Peds  Hematology   Anesthesia Other Findings   Reproductive/Obstetrics                             Anesthesia Physical  Anesthesia Plan  ASA: II  Anesthesia Plan: General   Post-op Pain Management:    Induction: Intravenous  PONV Risk Score and Plan: 1 and Ondansetron, Dexamethasone and Midazolam  Airway Management Planned: LMA  Additional Equipment: None  Intra-op Plan:   Post-operative Plan: Extubation in OR  Informed Consent: I have reviewed the patients History and Physical, chart, labs and discussed the procedure including the risks, benefits and alternatives for the proposed anesthesia with the patient or authorized representative who has indicated his/her understanding and acceptance.     Dental advisory given  Plan Discussed with: CRNA  Anesthesia Plan Comments:         Anesthesia Quick Evaluation

## 2020-02-03 NOTE — Op Note (Signed)
Preoperative diagnosis: Retained right ureteral stent  Postoperative diagnosis: Same  Procedure: 1 cystoscopy 2 right retrograde pyelography 3.  Intraoperative fluoroscopy, under one hour, with interpretation 4.  Right ureteral stent removal  Attending: Cleda Mccreedy  Anesthesia: General  Estimated blood loss: None  Drains: none  Specimens: right ureteral stent intact  Antibiotics: ancef  Findings: mildly encrusted right ureteral stent. No hydronephrosis on right retrograde pyelogram.  Indications: Patient is a 24 year old male with a history of retained right ureteral stent who did not tolerate stent removal in the office  After discussing treatment options, the patient decided to proceed wit right ureteral stent removal and possible laser lithotripsy for his retained right ureteral stent.   Procedure her in detail: The patient was brought to the operating room and a brief timeout was done to ensure correct patient, correct procedure, correct site.  General anesthesia was administered patient was placed in dorsal lithotomy position. His genitalia was then prepped and draped in usual sterile fashion.  A rigid 22 French cystoscope was passed in the urethra and the bladder.  Bladder was inspected free masses or lesions.  the ureteral orifices were in the normal orthotopic locations.  Using a grasper the right ureteral stent was removed without incident. a 6 french ureteral catheter was then instilled into the right ureter orifice.  a gentle retrograde was obtained and findings noted above.    the bladder was then drained and this concluded the procedure which was well tolerated by patient.  Complications: None  Condition: Stable, extubated, transferred to PACU  Plan: Patient is to be discharged home as to follow-up in 2 weeks.
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
## 2020-02-03 NOTE — Anesthesia Postprocedure Evaluation (Signed)
Anesthesia Post Note  Patient: Samuel Owens  Procedure(s) Performed: CYSTOSCOPY WITH RETROGRADE PYELOGRAM, URETEROSCOPY AND STENT REMOVAL (Right ) HOLMIUM LASER APPLICATION (Right )     Patient location during evaluation: Phase II Anesthesia Type: General Level of consciousness: awake Pain management: pain level controlled Vital Signs Assessment: post-procedure vital signs reviewed and stable Respiratory status: spontaneous breathing Cardiovascular status: stable Postop Assessment: no apparent nausea or vomiting Anesthetic complications: no   No complications documented.  Last Vitals:  Vitals:   02/03/20 1400 02/03/20 1415  BP: 128/86 138/85  Pulse: (!) 59 (!) 55  Resp: 14 15  Temp:    SpO2: 92% 95%    Last Pain:  Vitals:   02/03/20 1400  TempSrc:   PainSc: 0-No pain                 John F Scharlene Corn

## 2020-02-03 NOTE — Anesthesia Procedure Notes (Signed)
Procedure Name: LMA Insertion Date/Time: 02/03/2020 12:31 PM Performed by: Elsy Chiang D, CRNA Pre-anesthesia Checklist: Patient identified, Emergency Drugs available, Suction available and Patient being monitored Patient Re-evaluated:Patient Re-evaluated prior to induction Oxygen Delivery Method: Circle system utilized Preoxygenation: Pre-oxygenation with 100% oxygen Induction Type: IV induction Ventilation: Mask ventilation without difficulty LMA: LMA inserted LMA Size: 4.0 Tube type: Oral Number of attempts: 1 Placement Confirmation: positive ETCO2 and breath sounds checked- equal and bilateral Tube secured with: Tape Dental Injury: Teeth and Oropharynx as per pre-operative assessment

## 2020-02-06 ENCOUNTER — Encounter (HOSPITAL_BASED_OUTPATIENT_CLINIC_OR_DEPARTMENT_OTHER): Payer: Self-pay | Admitting: Urology

## 2020-02-10 ENCOUNTER — Encounter (HOSPITAL_COMMUNITY): Payer: Self-pay

## 2020-02-10 ENCOUNTER — Ambulatory Visit (HOSPITAL_COMMUNITY)
Admission: EM | Admit: 2020-02-10 | Discharge: 2020-02-10 | Disposition: A | Discharge status: Home / Self Care | Payer: Medicaid Other | Attending: Family Medicine | Admitting: Family Medicine

## 2020-02-10 ENCOUNTER — Other Ambulatory Visit: Payer: Self-pay

## 2020-02-10 DIAGNOSIS — S00511A Abrasion of lip, initial encounter: Secondary | ICD-10-CM | POA: Diagnosis not present

## 2020-02-10 MED ORDER — IBUPROFEN 800 MG PO TABS: 800.0000 mg | ORAL_TABLET | Freq: Three times a day (TID) | ORAL | 0 refills | Status: DC

## 2020-02-10 MED ORDER — CHLORHEXIDINE GLUCONATE 0.12 % MT SOLN: 15.0000 mL | Freq: Two times a day (BID) | OROMUCOSAL | 0 refills | Status: DC
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
## 2020-02-10 NOTE — Discharge Instructions (Signed)
Please use antibacterial mouthwash twice daily to help prevent infection with wound Wound should gradually heal with time Tylenol and ibuprofen for pain and swelling Ice lip  Follow-up for any concerns with lip/pain/healing

## 2020-02-19 ENCOUNTER — Emergency Department (HOSPITAL_COMMUNITY)
Admission: EM | Admit: 2020-02-19 | Discharge: 2020-02-19 | Discharge status: Against Medical Advice | Payer: Medicaid Other

## 2020-02-19 NOTE — ED Notes (Signed)
Pt called for triage x2 with no response.  

## 2020-02-19 NOTE — ED Notes (Signed)
Pt called x3 with no response  

## 2020-02-19 NOTE — ED Notes (Signed)
Pt called for triage x1 

## 2020-02-20 ENCOUNTER — Other Ambulatory Visit: Payer: Self-pay

## 2020-02-20 ENCOUNTER — Emergency Department (HOSPITAL_COMMUNITY)
Admission: EM | Admit: 2020-02-20 | Discharge: 2020-02-20 | Disposition: A | Discharge status: Home / Self Care | Payer: Medicaid Other | Attending: Emergency Medicine | Admitting: Emergency Medicine

## 2020-02-20 ENCOUNTER — Encounter (HOSPITAL_COMMUNITY): Payer: Self-pay | Admitting: *Deleted

## 2020-02-20 ENCOUNTER — Telehealth: Payer: Self-pay | Admitting: *Deleted

## 2020-02-20 DIAGNOSIS — H6003 Abscess of external ear, bilateral: Secondary | ICD-10-CM | POA: Diagnosis not present

## 2020-02-20 DIAGNOSIS — Z7902 Long term (current) use of antithrombotics/antiplatelets: Secondary | ICD-10-CM | POA: Insufficient documentation

## 2020-02-20 DIAGNOSIS — F1210 Cannabis abuse, uncomplicated: Secondary | ICD-10-CM | POA: Diagnosis not present

## 2020-02-20 DIAGNOSIS — L905 Scar conditions and fibrosis of skin: Secondary | ICD-10-CM | POA: Diagnosis not present

## 2020-02-20 DIAGNOSIS — F17210 Nicotine dependence, cigarettes, uncomplicated: Secondary | ICD-10-CM | POA: Diagnosis not present

## 2020-02-20 DIAGNOSIS — L0291 Cutaneous abscess, unspecified: Secondary | ICD-10-CM

## 2020-02-20 MED ORDER — CEPHALEXIN 500 MG PO CAPS: 500.0000 mg | ORAL_CAPSULE | Freq: Two times a day (BID) | ORAL | 0 refills | Status: AC

## 2020-02-20 MED ORDER — LIDOCAINE-EPINEPHRINE 1 %-1:100000 IJ SOLN
10.0000 mL | Freq: Once | INTRAMUSCULAR | Status: AC
  Administered 2020-02-20: 10 mL
  Filled 2020-02-20: qty 1

## 2020-02-20 NOTE — Telephone Encounter (Signed)
Samuel Owens presented to the ED and left before being seen by the provider on 02/19/20. The patient has been enrolled in an automated general discharge outreach program and 2 attempts to contact the patient will be made to follow up on their ED visit and subsequent needs. The care management team is available to provide assistance to this patient at any time.   Burnard Bunting, RN, BSN, CCRN [facility] [phone]
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
## 2020-02-20 NOTE — ED Notes (Signed)
Patient verbalizes understanding of discharge instructions . Opportunity for questions and answers were provided . Armband removed by staff ,Pt discharged from ED. W/C  offered at D/C  and Declined W/C at D/C and was escorted to lobby by RN.  

## 2020-02-20 NOTE — Discharge Instructions (Addendum)
Please take the whole course of antibiotics to prevent infection.
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
## 2020-02-21 ENCOUNTER — Telehealth: Payer: Self-pay | Admitting: *Deleted

## 2020-02-21 NOTE — Telephone Encounter (Signed)
Contacted pt to complete transition of care ssessment:  Transition Care Management Follow-up Telephone Call  . Medicaid Managed Care Transition Call Status:MM [facility] Call Made  . Date of discharge and from where: [facility], 02/20/20  . How have you been since you were released from the hospital? "ok"  . Any questions or concerns? No  Items Reviewed: Marland Kitchen Did the pt receive and understand the discharge instructions provided? Yes  . Medications obtained and verified? Yes  . Any new allergies since your discharge? No  . Dietary orders reviewed?  Na  . Do you have support at home?yes  Functional Questionnaire: (I = Independent and D = Dependent)  ADLs: Independent Bathing/Dressing:Independent Meal Prep: Independent Eating: Independent Maintaining continence: Independent Transferring/Ambulation: Independent Managing Meds: Independent Follow up appointments reviewed:  PCP Hospital f/u appt confirmed? No Pt states he will  contact PCP for follow up appt  Specialist Hospital f/u appt confirmed? n/a Are transportation arrangements needed? No   If their condition worsens, is the pt aware to call PCP or go to the EmergencyDept.? Yes   Was the patient provided with contact information for the PCP's office or ED? yes  Was to pt encouraged to call back with questions or concerns? Yes  Burnard Bunting, RN, BSN, CCRN [facility] [phone]

## 2020-05-09 ENCOUNTER — Ambulatory Visit
Admission: RE | Admit: 2020-05-09 | Discharge: 2020-05-09 | Disposition: A | Discharge status: Home / Self Care | Payer: Medicaid Other | Source: Ambulatory Visit | Attending: Family Medicine | Admitting: Family Medicine

## 2020-05-09 ENCOUNTER — Other Ambulatory Visit: Payer: Self-pay

## 2020-05-09 VITALS — BP 153/93 | HR 62 | Temp 98.0°F | Resp 17

## 2020-05-09 DIAGNOSIS — N342 Other urethritis: Secondary | ICD-10-CM | POA: Diagnosis present

## 2020-05-09 MED ORDER — CEFTRIAXONE SODIUM 500 MG IJ SOLR: 500.0000 mg | Freq: Once | INTRAMUSCULAR | Status: DC

## 2020-05-09 MED ORDER — DOXYCYCLINE HYCLATE 100 MG PO TABS: 100.0000 mg | ORAL_TABLET | Freq: Once | ORAL | Status: DC

## 2020-05-09 MED ORDER — CEFTRIAXONE SODIUM 1 G IJ SOLR: 1.0000 g | Freq: Once | INTRAMUSCULAR | Status: DC

## 2020-05-09 MED ORDER — AZITHROMYCIN 500 MG PO TABS: 1000.0000 mg | ORAL_TABLET | Freq: Once | ORAL | Status: DC
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
## 2020-05-09 NOTE — ED Triage Notes (Signed)
Pt states penile discharge since last night and no other symptoms. Pt is aox4 and ambulatory.

## 2020-05-10 LAB — CYTOLOGY, (ORAL, ANAL, URETHRAL) ANCILLARY ONLY
Chlamydia: POSITIVE — AB
Comment: NEGATIVE
Comment: NEGATIVE
Comment: NORMAL
Neisseria Gonorrhea: NEGATIVE
Trichomonas: NEGATIVE

## 2020-09-14 ENCOUNTER — Encounter (HOSPITAL_COMMUNITY): Payer: Self-pay

## 2020-09-14 ENCOUNTER — Other Ambulatory Visit: Payer: Self-pay

## 2020-09-14 ENCOUNTER — Ambulatory Visit: Payer: Self-pay

## 2020-09-14 ENCOUNTER — Ambulatory Visit (HOSPITAL_COMMUNITY)
Admission: EM | Admit: 2020-09-14 | Discharge: 2020-09-14 | Disposition: A | Discharge status: Home / Self Care | Payer: Medicaid Other | Attending: Emergency Medicine | Admitting: Emergency Medicine

## 2020-09-14 DIAGNOSIS — R369 Urethral discharge, unspecified: Secondary | ICD-10-CM | POA: Diagnosis present

## 2020-09-14 DIAGNOSIS — Z202 Contact with and (suspected) exposure to infections with a predominantly sexual mode of transmission: Secondary | ICD-10-CM | POA: Diagnosis present

## 2020-09-14 MED ORDER — LIDOCAINE HCL (PF) 1 % IJ SOLN
INTRAMUSCULAR | Status: AC
  Filled 2020-09-14: qty 2

## 2020-09-14 MED ORDER — DOXYCYCLINE HYCLATE 100 MG PO CAPS
100.0000 mg | ORAL_CAPSULE | Freq: Two times a day (BID) | ORAL | 0 refills | Status: AC
Start: 2020-09-14 — End: 2020-09-21

## 2020-09-14 MED ORDER — CEFTRIAXONE SODIUM 500 MG IJ SOLR
500.0000 mg | Freq: Once | INTRAMUSCULAR | Status: AC
  Administered 2020-09-14: 500 mg via INTRAMUSCULAR

## 2020-09-14 MED ORDER — CEFTRIAXONE SODIUM 500 MG IJ SOLR
INTRAMUSCULAR | Status: AC
  Filled 2020-09-14: qty 500
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
## 2020-09-14 NOTE — ED Triage Notes (Signed)
Pt in with c/o penile discharge that he noticed this morning, requesting STD testing  Denies any other sxs

## 2020-09-14 NOTE — Discharge Instructions (Addendum)
You were treated with an antibiotic today called Rocephin.  Take doxycycline twice a day for 7 days.    Your penile tests are pending.  If your test results are positive, we will call you.  You and your sexual partner(s) may require treatment at that time.  Do not have sexual activity for at least 7 days.

## 2020-09-17 LAB — CYTOLOGY, (ORAL, ANAL, URETHRAL) ANCILLARY ONLY
Chlamydia: NEGATIVE
Comment: NEGATIVE
Comment: NEGATIVE
Comment: NORMAL
Neisseria Gonorrhea: POSITIVE — AB
Trichomonas: POSITIVE — AB

## 2020-09-19 ENCOUNTER — Telehealth (HOSPITAL_COMMUNITY): Payer: Self-pay | Admitting: Emergency Medicine

## 2020-09-19 MED ORDER — METRONIDAZOLE 500 MG PO TABS: 500.0000 mg | ORAL_TABLET | Freq: Two times a day (BID) | ORAL | 0 refills | Status: DC

## 2020-10-14 ENCOUNTER — Ambulatory Visit (HOSPITAL_COMMUNITY): Payer: Self-pay

## 2020-10-21 ENCOUNTER — Ambulatory Visit (HOSPITAL_COMMUNITY): Payer: Self-pay

## 2020-11-24 ENCOUNTER — Emergency Department (HOSPITAL_COMMUNITY)
Admission: EM | Admit: 2020-11-24 | Discharge: 2020-11-24 | Disposition: A | Discharge status: Home / Self Care | Payer: Medicaid Other | Attending: Emergency Medicine | Admitting: Emergency Medicine

## 2020-11-24 ENCOUNTER — Other Ambulatory Visit: Payer: Self-pay

## 2020-11-24 ENCOUNTER — Encounter (HOSPITAL_COMMUNITY): Payer: Self-pay

## 2020-11-24 DIAGNOSIS — F17210 Nicotine dependence, cigarettes, uncomplicated: Secondary | ICD-10-CM | POA: Diagnosis not present

## 2020-11-24 DIAGNOSIS — Z7901 Long term (current) use of anticoagulants: Secondary | ICD-10-CM | POA: Diagnosis not present

## 2020-11-24 DIAGNOSIS — K029 Dental caries, unspecified: Secondary | ICD-10-CM | POA: Diagnosis not present

## 2020-11-24 DIAGNOSIS — K0889 Other specified disorders of teeth and supporting structures: Secondary | ICD-10-CM | POA: Diagnosis present

## 2020-11-24 MED ORDER — HYDROCODONE-ACETAMINOPHEN 5-325 MG PO TABS
1.0000 | ORAL_TABLET | Freq: Once | ORAL | Status: AC
  Administered 2020-11-24: 1 via ORAL
  Filled 2020-11-24: qty 1

## 2020-11-24 MED ORDER — HYDROCODONE-ACETAMINOPHEN 5-325 MG PO TABS: 1.0000 | ORAL_TABLET | Freq: Four times a day (QID) | ORAL | 0 refills | Status: DC | PRN

## 2020-11-24 MED ORDER — AMOXICILLIN-POT CLAVULANATE 875-125 MG PO TABS: 1.0000 | ORAL_TABLET | Freq: Two times a day (BID) | ORAL | 0 refills | Status: AC

## 2020-11-24 MED ORDER — LIDOCAINE VISCOUS HCL 2 % MT SOLN: 15.0000 mL | OROMUCOSAL | 0 refills | Status: DC | PRN

## 2020-11-24 NOTE — ED Triage Notes (Signed)
Patient reports R sided dental pain, denies any fevers, think it is his wisdom teeth

## 2020-11-24 NOTE — Discharge Instructions (Addendum)
Today you were seen for dental pain.  If you start to experience and new or worsening symptoms return to the emergency department. If you start to experience fever, chills, neck stiffness/pain, or inability to move your neck or open your mouth come back to the emergency department immediately. If you begin to experience any blistering, rashes, swelling, or difficulty breathing seek medical care for evaluation of potentially more serious side effects.  Medications:  -I have prescribed you an antibiotic augmentin to treat the infection and norco for severe pain only. Do not drive or work while taking. You can also take tylenol and motrin for pain at home.  - You can also gargle warm salt water up to 5 times daily. This rinse can help to remove bacteria from the mouth.   You can also use viscous lidocaine every 3 hours to help numb the area.  You can apply a cool compress for 15 to 20 minutes, but avoid applying heat to the area. -Continue usual home medications.  2. Follow Up:  -call on call dentist Dr. Mia Creek. If you have trouble getting in to see him use the resources below   Use the resource guide listed below to help you find a dentist if you do not already have one to follow up with. It is very important that you get evaluated by a dentist as soon as possible. Call tomorrow to schedule an appointment. Read the instructions below.     Be sure to eat something when taking the Naproxen as it can cause stomach upset and at worst stomach bleeding. Do not take additional non steroidal anti-inflammatory medicines such as Ibuprofen, Aleve, Advil, Mobic, Diclofenac, or goodie powder while taking Naproxen. You may supplement with Tylenol.    Dental Care: Organization         Address  Phone  Notes  [facility] of [facility] [address] [place], [place] [phone] Accepts children up to age 30 who are enrolled in [place] or [place] Health Choice;  pregnant women with a Medicaid card; and children who have applied for Medicaid or [place] Health Choice, but were declined, whose parents can pay a reduced fee at time of service.  [facility] of [facility]  [address], [place] [phone] Accepts children up to age 24 who are enrolled in [place] or [place] Health Choice; pregnant women with a Medicaid card; and children who have applied for Medicaid or [employer] Health Choice, but were declined, whose parents can pay a reduced fee at time of service.  [facility]  [address] [place], [place] [phone] Patients are seen by appointment only. Walk-ins are not accepted. [facility] will see patients 74 years of age and older. Monday - Tuesday (8am-5pm) Most Wednesdays (8:30-5pm) $30 per visit, cash only  [facility]  [address], [facility] [phone] Patients are seen by appointment only. Walk-ins are not accepted. [facility] will see patients 45 years of age and older. One Wednesday Evening (Monthly: Volunteer Based).  $30 per visit, cash only  [employer]  [phone] for adults; Children under age 74, call Graduate Pediatric Dentistry at [phone]. Children aged 2-14, please call [phone] to request a pediatric application.  Dental services are provided in all areas of dental care including fillings, crowns and bridges, complete and partial dentures, implants, gum treatment, root canals, and extractions.  Preventive care is also provided. Treatment is provided to both adults and children. Patients are selected via a lottery and there is often a waiting list.   [facility] [address] [place]  [phone] [email or web address]   [facility] [address] [place], [place] [phone], Ext. [phone] Second and Fourth Thursday of each month, opens at 6:30 AM; Clinic ends at 9 AM.   Patients are seen on a first-come first-served basis, and a limited number are seen during each clinic.   [facility]  [address] [place], [place] [phone]   Eligibility Requirements You must have lived in [place], [place], or [place] counties for at least the last three months.   You cannot be eligible for state or federal sponsored [place], including [employer], [place], or [employer].   You generally cannot be eligible for healthcare insurance through your employer.    How to apply: Eligibility screenings are held every Tuesday and Wednesday afternoon from 1:00 pm until 4:00 pm. You do not need an appointment for the interview!  [facility] [address] [place], [place] [phone]   [facility]  [phone]   [facility] Health Department  [phone]   [facility] Health Department  [phone]    RESOURCE GUIDE:  Dental Problems  Patients with Medicaid: [facility] [id number]                                [phone] [address] [employer] Phone:  [phone]                                                  Phone:  [phone]  If unable to pay or uninsured, contact:  [facility] or [facility]. to become qualified for the adult dental clinic.  Insufficient Money for Medicine Contact [employer]:  call "211" or [facility] [phone].  No Primary Care Doctor Call [facility]  [phone] Other agencies that provide inexpensive medical care    [facility]  [phone]    [facility]  [phone]    [facility]  [phone]    [facility]  [phone]    [facility]  [phone]    [facility]  [phone]
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
## 2020-11-26 ENCOUNTER — Telehealth: Payer: Self-pay

## 2020-11-26 NOTE — Telephone Encounter (Signed)
Transition Care Management Follow-up Telephone Call  Date of discharge and from where: 11/24/2020 from [facility]  How have you been since you were released from the hospital? Pt stated that he is feeling much better after starting the medication.   Any questions or concerns? No  Items Reviewed:  Did the pt receive and understand the discharge instructions provided? Yes   Medications obtained and verified? Yes   Other? No   Any new allergies since your discharge? No   Dietary orders reviewed? n/a  Do you have support at home? Yes   Functional Questionnaire: (I = Independent and D = Dependent) ADLs: I  Bathing/Dressing- I  Meal Prep- I  Eating- I  Maintaining continence- I  Transferring/Ambulation- I  Managing Meds- I   Follow up appointments reviewed:   PCP Hospital f/u appt confirmed? No    Specialist Hospital f/u appt confirmed? No    Are transportation arrangements needed? No   If their condition worsens, is the pt aware to call PCP or go to the Emergency Dept.? Yes  Was the patient provided with contact information for the PCP's office or ED? Yes  Was to pt encouraged to call back with questions or concerns? Yes

## 2021-05-13 ENCOUNTER — Emergency Department (HOSPITAL_COMMUNITY)
Admission: EM | Admit: 2021-05-13 | Discharge: 2021-05-13 | Disposition: A | Discharge status: Home / Self Care | Payer: Medicaid Other | Attending: Emergency Medicine | Admitting: Emergency Medicine

## 2021-05-13 DIAGNOSIS — F17210 Nicotine dependence, cigarettes, uncomplicated: Secondary | ICD-10-CM | POA: Insufficient documentation

## 2021-05-13 DIAGNOSIS — Z5189 Encounter for other specified aftercare: Secondary | ICD-10-CM

## 2021-05-13 DIAGNOSIS — Z4800 Encounter for change or removal of nonsurgical wound dressing: Secondary | ICD-10-CM | POA: Diagnosis not present

## 2021-05-13 MED ORDER — CEPHALEXIN 250 MG PO CAPS: 250.0000 mg | ORAL_CAPSULE | Freq: Four times a day (QID) | ORAL | 0 refills | Status: AC
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
## 2021-05-13 NOTE — Discharge Instructions (Addendum)
Take antibiotics as prescribed.  Take entire course, even if symptoms improve. Use warm compresses, 20 minutes at a time, 3 times a day to help with pain and swelling. Call the office listed below to set up a follow-up appointment as needed for further concerns about your previous surgery. Return to the emergency room with any new, worsening, concerning symptoms

## 2021-06-08 ENCOUNTER — Other Ambulatory Visit: Payer: Self-pay

## 2021-06-08 ENCOUNTER — Emergency Department (HOSPITAL_COMMUNITY)
Admission: EM | Admit: 2021-06-08 | Discharge: 2021-06-08 | Discharge status: Against Medical Advice | Payer: Medicaid Other | Attending: Emergency Medicine | Admitting: Emergency Medicine

## 2021-06-08 ENCOUNTER — Encounter (HOSPITAL_COMMUNITY): Payer: Self-pay | Admitting: Emergency Medicine

## 2021-06-08 DIAGNOSIS — K0889 Other specified disorders of teeth and supporting structures: Secondary | ICD-10-CM | POA: Insufficient documentation

## 2021-06-08 DIAGNOSIS — Z5321 Procedure and treatment not carried out due to patient leaving prior to being seen by health care provider: Secondary | ICD-10-CM | POA: Diagnosis not present

## 2021-06-08 NOTE — ED Triage Notes (Signed)
Patient here with dental pain on the left and right side of his mouth on the top.  He states that he has fractured teeth.

## 2021-06-12 ENCOUNTER — Encounter (HOSPITAL_COMMUNITY): Payer: Self-pay | Admitting: Emergency Medicine

## 2021-06-12 ENCOUNTER — Emergency Department (HOSPITAL_COMMUNITY): Payer: Medicaid Other

## 2021-06-12 ENCOUNTER — Emergency Department (HOSPITAL_COMMUNITY)
Admission: EM | Admit: 2021-06-12 | Discharge: 2021-06-12 | Disposition: A | Discharge status: Home / Self Care | Payer: Medicaid Other | Attending: Emergency Medicine | Admitting: Emergency Medicine

## 2021-06-12 ENCOUNTER — Other Ambulatory Visit: Payer: Self-pay

## 2021-06-12 DIAGNOSIS — N50811 Right testicular pain: Secondary | ICD-10-CM

## 2021-06-12 DIAGNOSIS — F17210 Nicotine dependence, cigarettes, uncomplicated: Secondary | ICD-10-CM | POA: Insufficient documentation

## 2021-06-12 LAB — COMPREHENSIVE METABOLIC PANEL
ALT: 14 U/L (ref 0–44)
AST: 18 U/L (ref 15–41)
Albumin: 3.4 g/dL — ABNORMAL LOW (ref 3.5–5.0)
Alkaline Phosphatase: 69 U/L (ref 38–126)
Anion gap: 6 (ref 5–15)
BUN: 9 mg/dL (ref 6–20)
CO2: 25 mmol/L (ref 22–32)
Calcium: 8.9 mg/dL (ref 8.9–10.3)
Chloride: 109 mmol/L (ref 98–111)
Creatinine, Ser: 1.1 mg/dL (ref 0.61–1.24)
GFR, Estimated: >60 mL/min (ref >60)
Glucose, Bld: 117 mg/dL — ABNORMAL HIGH (ref 70–99)
Potassium: 3.5 mmol/L (ref 3.5–5.1)
Sodium: 140 mmol/L (ref 135–145)
Total Bilirubin: 0.9 mg/dL (ref 0.3–1.2)
Total Protein: 6.8 g/dL (ref 6.5–8.1)

## 2021-06-12 LAB — CBC WITH DIFFERENTIAL/PLATELET
Abs Immature Granulocytes: 0.04 10*3/uL (ref 0.00–0.07)
Basophils Absolute: 0 10*3/uL (ref 0.0–0.1)
Basophils Relative: 0 %
Eosinophils Absolute: 0 10*3/uL (ref 0.0–0.5)
Eosinophils Relative: 0 %
HCT: 44.9 % (ref 39.0–52.0)
Hemoglobin: 14.9 g/dL (ref 13.0–17.0)
Immature Granulocytes: 0 %
Lymphocytes Relative: 11 %
Lymphs Abs: 1.5 10*3/uL (ref 0.7–4.0)
MCH: 30.3 pg (ref 26.0–34.0)
MCHC: 33.2 g/dL (ref 30.0–36.0)
MCV: 91.3 fL (ref 80.0–100.0)
Monocytes Absolute: 1 10*3/uL (ref 0.1–1.0)
Monocytes Relative: 8 %
Neutro Abs: 10.6 10*3/uL — ABNORMAL HIGH (ref 1.7–7.7)
Neutrophils Relative %: 81 %
Platelets: 214 10*3/uL (ref 150–400)
RBC: 4.92 MIL/uL (ref 4.22–5.81)
RDW: 13.7 % (ref 11.5–15.5)
WBC: 13.2 10*3/uL — ABNORMAL HIGH (ref 4.0–10.5)
nRBC: 0 % (ref 0.0–0.2)

## 2021-06-12 LAB — URINALYSIS, ROUTINE W REFLEX MICROSCOPIC
Bilirubin Urine: NEGATIVE
Glucose, UA: NEGATIVE mg/dL
Hgb urine dipstick: NEGATIVE
Ketones, ur: NEGATIVE mg/dL
Leukocytes,Ua: NEGATIVE
Nitrite: NEGATIVE
Protein, ur: NEGATIVE mg/dL
Specific Gravity, Urine: 1.023 (ref 1.005–1.030)
pH: 5 (ref 5.0–8.0)

## 2021-06-12 LAB — LIPASE, BLOOD: Lipase: 34 U/L (ref 11–51)

## 2021-06-12 MED ORDER — CEPHALEXIN 500 MG PO CAPS: 500.0000 mg | ORAL_CAPSULE | Freq: Two times a day (BID) | ORAL | 0 refills | Status: AC

## 2021-06-12 MED ORDER — IBUPROFEN 600 MG PO TABS: 600.0000 mg | ORAL_TABLET | Freq: Four times a day (QID) | ORAL | 0 refills | Status: DC | PRN

## 2021-06-12 NOTE — Discharge Instructions (Addendum)
Please follow-up with urology about your ultrasound.  In regards to the stitches in your abdomen, I believe you should follow-up with a general surgeon to be sure that these type of stitches should be removed.
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
## 2021-06-12 NOTE — ED Notes (Signed)
Patient transported to Ultrasound 

## 2021-06-12 NOTE — ED Triage Notes (Signed)
Patient reports right sided flank pain radiating into lower right abdominal pain and right scrotum pain onset of yesterday am. Pain worse when trying to close legs. Reports tender to touch. Denies any dysuria or discharge.

## 2021-06-12 NOTE — ED Provider Notes (Signed)
Emergency Medicine Provider Triage Evaluation Note  Arnold Kester , a 25 y.o. male  was evaluated in triage.  Pt complains of R flank pain, right testicle pain. Denies urinary sxs or penile discharge.  Review of Systems  Positive: Flank pain, testicle pain Negative: Urinary sxs, penile discharge  Physical Exam  BP (!) 140/108 (BP Location: Right Arm)   Pulse 76   Temp 98.4 F (36.9 C)   Resp 18   Ht 5\' 8"  (1.727 m)   Wt 77.1 kg   SpO2 98%   BMI 25.85 kg/m  Gen:   Awake, no distress   Resp:  Normal effort  MSK:   Moves extremities without difficulty  Other:  Chaperone present for gu exam. Cyst noted to the scrotum, ttp over the epididymis. No erythema, swelling  Medical Decision Making  Medically screening exam initiated at 12:27 PM.  Appropriate orders placed.  Sloan Takagi was informed that the remainder of the evaluation will be completed by another provider, this initial triage assessment does not replace that evaluation, and the importance of remaining in the ED until their evaluation is complete.     Selena Lesser, PA-C 06/12/21 1227    06/14/21, DO 06/12/21 1624

## 2021-07-12 ENCOUNTER — Ambulatory Visit
Admission: RE | Admit: 2021-07-12 | Discharge: 2021-07-12 | Disposition: A | Discharge status: Home / Self Care | Payer: Medicaid Other | Source: Ambulatory Visit | Attending: Physician Assistant | Admitting: Physician Assistant

## 2021-07-12 ENCOUNTER — Other Ambulatory Visit: Payer: Self-pay

## 2021-07-12 VITALS — BP 162/115 | HR 59 | Temp 98.0°F | Resp 18

## 2021-07-12 DIAGNOSIS — Z113 Encounter for screening for infections with a predominantly sexual mode of transmission: Secondary | ICD-10-CM
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
## 2021-07-12 NOTE — ED Triage Notes (Signed)
Onset last night of penile discharge. Denies penile lesions, irritation, dysuria and urinary frequency. No known exposures.

## 2021-07-14 DIAGNOSIS — R369 Urethral discharge, unspecified: Secondary | ICD-10-CM | POA: Insufficient documentation

## 2021-07-14 DIAGNOSIS — N4889 Other specified disorders of penis: Secondary | ICD-10-CM | POA: Diagnosis not present

## 2021-07-14 DIAGNOSIS — Z79899 Other long term (current) drug therapy: Secondary | ICD-10-CM | POA: Diagnosis not present

## 2021-07-14 DIAGNOSIS — F17210 Nicotine dependence, cigarettes, uncomplicated: Secondary | ICD-10-CM | POA: Insufficient documentation

## 2021-07-15 ENCOUNTER — Other Ambulatory Visit: Payer: Self-pay

## 2021-07-15 ENCOUNTER — Emergency Department (HOSPITAL_COMMUNITY)
Admission: EM | Admit: 2021-07-15 | Discharge: 2021-07-15 | Disposition: A | Discharge status: Home / Self Care | Payer: Medicaid Other | Attending: Emergency Medicine | Admitting: Emergency Medicine

## 2021-07-15 ENCOUNTER — Encounter (HOSPITAL_COMMUNITY): Payer: Self-pay | Admitting: *Deleted

## 2021-07-15 ENCOUNTER — Emergency Department (HOSPITAL_COMMUNITY)
Admission: EM | Admit: 2021-07-15 | Discharge: 2021-07-15 | Disposition: A | Discharge status: Home / Self Care | Payer: Medicaid Other | Source: Home / Self Care | Attending: Emergency Medicine | Admitting: Emergency Medicine

## 2021-07-15 DIAGNOSIS — R369 Urethral discharge, unspecified: Secondary | ICD-10-CM

## 2021-07-15 DIAGNOSIS — Z711 Person with feared health complaint in whom no diagnosis is made: Secondary | ICD-10-CM

## 2021-07-15 DIAGNOSIS — N4889 Other specified disorders of penis: Secondary | ICD-10-CM | POA: Insufficient documentation

## 2021-07-15 DIAGNOSIS — F17210 Nicotine dependence, cigarettes, uncomplicated: Secondary | ICD-10-CM | POA: Insufficient documentation

## 2021-07-15 DIAGNOSIS — Z79899 Other long term (current) drug therapy: Secondary | ICD-10-CM | POA: Insufficient documentation

## 2021-07-15 LAB — URINALYSIS, ROUTINE W REFLEX MICROSCOPIC
Bilirubin Urine: NEGATIVE
Bilirubin Urine: NEGATIVE
Glucose, UA: NEGATIVE mg/dL
Glucose, UA: NEGATIVE mg/dL
Ketones, ur: NEGATIVE mg/dL
Ketones, ur: NEGATIVE mg/dL
Nitrite: NEGATIVE
Nitrite: NEGATIVE
Protein, ur: NEGATIVE mg/dL
Protein, ur: NEGATIVE mg/dL
Specific Gravity, Urine: >1.03 — ABNORMAL HIGH (ref 1.005–1.030)
Specific Gravity, Urine: 1.025 (ref 1.005–1.030)
pH: 6 (ref 5.0–8.0)
pH: 6 (ref 5.0–8.0)

## 2021-07-15 LAB — URINALYSIS, MICROSCOPIC (REFLEX)
Squamous Epithelial / HPF: NONE SEEN (ref 0–5)
WBC, UA: >50 WBC/hpf (ref 0–5)
WBC, UA: >50 WBC/hpf (ref 0–5)

## 2021-07-15 MED ORDER — LIDOCAINE HCL (PF) 1 % IJ SOLN
1.0000 mL | Freq: Once | INTRAMUSCULAR | Status: AC
  Administered 2021-07-15: 18:00:00 1 mL
  Filled 2021-07-15: qty 5

## 2021-07-15 MED ORDER — CEFTRIAXONE SODIUM 500 MG IJ SOLR
500.0000 mg | Freq: Once | INTRAMUSCULAR | Status: AC
  Administered 2021-07-15: 18:00:00 500 mg via INTRAMUSCULAR
  Filled 2021-07-15: qty 500

## 2021-07-15 MED ORDER — DOXYCYCLINE HYCLATE 100 MG PO TABS: 100.0000 mg | ORAL_TABLET | Freq: Two times a day (BID) | ORAL | 0 refills | Status: DC

## 2021-07-15 NOTE — ED Notes (Addendum)
Pt called for vitals recheck x3. No response.  

## 2021-07-15 NOTE — ED Triage Notes (Signed)
Pt reports penile discharge and burning that started yesterday.

## 2021-07-15 NOTE — Discharge Instructions (Addendum)
As we discussed the results of the gonorrhea, chlamydia swab will not be available on your portal for 24 to 48 hours.  In the meantime we will treat you for presumed sexually transmitted infection since you have active dysuria and discharge from your penis.  Please refrain from sexual activity until your discharge is completely resolved, you finish all of your antibiotics, and your dysuria is resolved.  Please inform any recent sexual partners of your diagnosis once the results return.  Please return for further evaluation if your symptoms do not improve despite treatment.
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
## 2021-07-15 NOTE — ED Triage Notes (Signed)
Pt reports penile discharge and burning pain with urination. Was at [facility] on 12/23 for testing but they did not treat him at that time. Came last night but left prior to being seen.

## 2021-07-16 LAB — GC/CHLAMYDIA PROBE AMP (~~LOC~~) NOT AT ARMC
Chlamydia: POSITIVE — AB
Comment: NEGATIVE
Comment: NORMAL
Neisseria Gonorrhea: POSITIVE — AB

## 2021-07-16 LAB — CYTOLOGY, (ORAL, ANAL, URETHRAL) ANCILLARY ONLY
Chlamydia: POSITIVE — AB
Comment: NEGATIVE
Comment: NEGATIVE
Comment: NORMAL
Neisseria Gonorrhea: POSITIVE — AB
Trichomonas: POSITIVE — AB

## 2023-03-02 ENCOUNTER — Ambulatory Visit (HOSPITAL_COMMUNITY)
Admission: RE | Admit: 2023-03-02 | Discharge: 2023-03-02 | Disposition: A | Discharge status: Home / Self Care | Payer: Medicaid Other | Source: Ambulatory Visit | Attending: Nurse Practitioner | Admitting: Nurse Practitioner

## 2023-03-02 ENCOUNTER — Other Ambulatory Visit: Payer: Self-pay

## 2023-03-02 ENCOUNTER — Encounter (HOSPITAL_COMMUNITY): Payer: Self-pay

## 2023-03-02 VITALS — BP 162/102 | HR 75 | Temp 98.0°F | Resp 16

## 2023-03-02 DIAGNOSIS — R369 Urethral discharge, unspecified: Secondary | ICD-10-CM | POA: Diagnosis present

## 2023-03-02 DIAGNOSIS — I10 Essential (primary) hypertension: Secondary | ICD-10-CM | POA: Insufficient documentation

## 2023-03-02 LAB — HIV ANTIBODY (ROUTINE TESTING W REFLEX): HIV Screen 4th Generation wRfx: NONREACTIVE

## 2023-03-02 MED ORDER — CEFTRIAXONE SODIUM 500 MG IJ SOLR
500.0000 mg | INTRAMUSCULAR | Status: DC
  Administered 2023-03-02: 500 mg via INTRAMUSCULAR

## 2023-03-02 MED ORDER — CEFTRIAXONE SODIUM 1 G IJ SOLR
INTRAMUSCULAR | Status: AC
  Filled 2023-03-02: qty 10

## 2023-03-02 MED ORDER — LIDOCAINE HCL (PF) 1 % IJ SOLN
INTRAMUSCULAR | Status: AC
  Filled 2023-03-02: qty 2
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
## 2023-03-02 NOTE — ED Triage Notes (Signed)
Pt reports drainage from penis and needs a STD check.

## 2023-03-02 NOTE — Discharge Instructions (Signed)
We are testing you for gonorrhea, chlamydia, trichomoniasis, HIV, and syphilis today.  We have treated you for gonorrhea with the Rocephin.  We will contact you if any other test results are positive and will prescribe treatment at that time if so.  Please use condoms with every sexual encounter moving forward.  Please take your blood pressure medication when you get home.  Follow up with PCP if high blood pressure persists.

## 2023-03-04 ENCOUNTER — Telehealth (HOSPITAL_COMMUNITY): Payer: Self-pay | Admitting: Emergency Medicine

## 2023-03-04 MED ORDER — DOXYCYCLINE HYCLATE 100 MG PO CAPS: 100.0000 mg | ORAL_CAPSULE | Freq: Two times a day (BID) | ORAL | 0 refills | Status: AC

## 2023-04-22 ENCOUNTER — Ambulatory Visit: Payer: Medicaid Other | Admitting: Family Medicine

## 2023-08-14 ENCOUNTER — Emergency Department (HOSPITAL_COMMUNITY): Admission: EM | Admit: 2023-08-14 | Discharge: 2023-08-14 | Discharge status: Against Medical Advice | Payer: 59

## 2023-08-14 NOTE — ED Notes (Signed)
Pt stated that he does not want to be seen here due to long wait time. Pt seen leaving out of ED doors.

## 2024-05-15 ENCOUNTER — Inpatient Hospital Stay: Admission: RE | Admit: 2024-05-15 | Discharge: 2024-05-15 | Payer: Self-pay | Attending: Family Medicine

## 2024-05-15 VITALS — BP 165/123 | HR 69 | Temp 98.5°F | Resp 19 | Ht 68.0 in | Wt 190.0 lb

## 2024-05-15 DIAGNOSIS — I10 Essential (primary) hypertension: Secondary | ICD-10-CM | POA: Insufficient documentation

## 2024-05-15 DIAGNOSIS — I160 Hypertensive urgency: Secondary | ICD-10-CM | POA: Diagnosis present

## 2024-05-15 DIAGNOSIS — N342 Other urethritis: Secondary | ICD-10-CM | POA: Diagnosis not present

## 2024-05-15 MED ORDER — CEFTRIAXONE SODIUM 500 MG IJ SOLR
500.0000 mg | INTRAMUSCULAR | Status: DC
  Administered 2024-05-15: 500 mg via INTRAMUSCULAR

## 2024-05-15 MED ORDER — DOXYCYCLINE HYCLATE 100 MG PO CAPS: 100.0000 mg | ORAL_CAPSULE | Freq: Two times a day (BID) | ORAL | 0 refills | Status: AC

## 2024-05-15 MED ORDER — DOXYCYCLINE HYCLATE 100 MG PO CAPS: 100.0000 mg | ORAL_CAPSULE | Freq: Two times a day (BID) | ORAL | 0 refills | Status: DC

## 2024-05-15 MED ORDER — AMLODIPINE BESYLATE 5 MG PO TABS: 5.0000 mg | ORAL_TABLET | Freq: Every day | ORAL | 0 refills | Status: AC
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
## 2024-05-15 NOTE — ED Triage Notes (Signed)
 Samuel Owens states he is here for penile discharge that started yesterday. No treatment used.

## 2024-05-15 NOTE — Discharge Instructions (Addendum)
 Avoid all forms of sexual intercourse (oral, vaginal, anal) for the next 7 days to avoid spreading/reinfecting or at least until we can see what kinds of infection results are positive.  Abstaining for 2 weeks would be better but at least 1 week is required.  We will let you know about your test results from the swab we did today and if you need any prescriptions for antibiotics or changes to your treatment from today.  Start amlodipine for your blood pressure. Take this once daily. Follow up with a new PCP as soon as possible.   For diabetes or elevated blood sugar, please make sure you are limiting and avoiding starchy, carbohydrate foods like pasta, breads, sweet breads, pastry, rice, potatoes, desserts. These foods can elevate your blood sugar. Also, limit and avoid drinks that contain a lot of sugar such as sodas, sweet teas, fruit juices.  Drinking plain water  will be much more helpful, try 64 ounces of water  daily.  It is okay to flavor your water  naturally by cutting cucumber, lemon, mint or lime, placing it in a picture with water  and drinking it over a period of 24-48 hours as long as it remains refrigerated.  For elevated blood pressure, make sure you are monitoring salt in your diet.  Do not eat restaurant foods and limit processed foods at home. I highly recommend you prepare and cook your own foods at home.  Processed foods include things like frozen meals, pre-seasoned meats and dinners, deli meats, canned foods as these foods contain a high amount of sodium/salt.  Make sure you are paying attention to sodium labels on foods you buy at the grocery store. Buy your spices separately such as garlic powder, onion powder, cumin, cayenne, parsley flakes so that you can avoid seasonings that contain salt. However, salt-free seasonings are available and can be used, an example is Mrs. Dash and includes a lot of different mixtures that do not contain salt.  Lastly, when cooking using oils that are  healthier for you is important. This includes olive oil, avocado oil, canola oil. We have discussed a lot of foods to avoid but below is a list of foods that can be very healthy to use in your diet whether it is for diabetes, cholesterol, high blood pressure, or in general healthy eating.  Salads - kale, spinach, cabbage, spring mix, arugula Fruits - avocadoes, berries (blueberries, raspberries, blackberries), apples, oranges, pomegranate, grapefruit, kiwi Vegetables - asparagus, cauliflower, broccoli, green beans, brussel sprouts, bell peppers, beets; stay away from or limit starchy vegetables like potatoes, carrots, peas Other general foods - kidney beans, egg whites, almonds, walnuts, sunflower seeds, pumpkin seeds, fat free yogurt, almond milk, flax seeds, quinoa, oats  Meat - It is better to eat lean meats and limit your red meat including pork to once a week.  Wild caught fish, chicken breast are good options as they tend to be leaner sources of good protein. Still be mindful of the sodium labels for the meats you buy.  DO NOT EAT ANY FOODS ON THIS LIST THAT YOU ARE ALLERGIC TO. For more specific needs, I highly recommend consulting a dietician or nutritionist but this can definitely be a good starting point.

## 2024-05-16 LAB — CYTOLOGY, (ORAL, ANAL, URETHRAL) ANCILLARY ONLY
Chlamydia: POSITIVE — AB
Comment: NEGATIVE
Comment: NEGATIVE
Comment: NORMAL
Neisseria Gonorrhea: POSITIVE — AB
Trichomonas: NEGATIVE

## 2024-05-17 ENCOUNTER — Ambulatory Visit (HOSPITAL_COMMUNITY): Payer: Self-pay
